# Patient Record
Sex: Female | Born: 1980 | Race: White | Hispanic: No | Marital: Married | Smoking: Never smoker
Health system: Southern US, Community
[De-identification: ages and names within clinical notes are randomized; demographics above are authoritative.]

## PROBLEM LIST (undated history)

## (undated) DIAGNOSIS — N644 Mastodynia: Secondary | ICD-10-CM

## (undated) DIAGNOSIS — F419 Anxiety disorder, unspecified: Secondary | ICD-10-CM

## (undated) DIAGNOSIS — L309 Dermatitis, unspecified: Secondary | ICD-10-CM

## (undated) DIAGNOSIS — F32A Depression, unspecified: Secondary | ICD-10-CM

## (undated) DIAGNOSIS — L719 Rosacea, unspecified: Secondary | ICD-10-CM

## (undated) DIAGNOSIS — I499 Cardiac arrhythmia, unspecified: Secondary | ICD-10-CM

## (undated) DIAGNOSIS — K602 Anal fissure, unspecified: Secondary | ICD-10-CM

## (undated) DIAGNOSIS — L509 Urticaria, unspecified: Secondary | ICD-10-CM

## (undated) DIAGNOSIS — T7840XA Allergy, unspecified, initial encounter: Secondary | ICD-10-CM

## (undated) HISTORY — DX: Urticaria, unspecified: L50.9

## (undated) HISTORY — DX: Dermatitis, unspecified: L30.9

## (undated) HISTORY — DX: Allergy, unspecified, initial encounter: T78.40XA

## (undated) HISTORY — DX: Cardiac arrhythmia, unspecified: I49.9

## (undated) HISTORY — DX: Anal fissure, unspecified: K60.2

## (undated) HISTORY — PX: REFRACTIVE SURGERY: SHX103

## (undated) HISTORY — DX: Anxiety disorder, unspecified: F41.9

## (undated) HISTORY — DX: Rosacea, unspecified: L71.9

## (undated) HISTORY — PX: BREAST IMPLANT REMOVAL: SUR1101

## (undated) HISTORY — DX: Depression, unspecified: F32.A

## (undated) HISTORY — PX: AUGMENTATION MAMMAPLASTY: SUR837

---

## 2019-03-05 ENCOUNTER — Encounter: Payer: Self-pay | Admitting: Internal Medicine

## 2019-03-05 ENCOUNTER — Telehealth: Payer: Self-pay | Admitting: Internal Medicine

## 2019-03-05 ENCOUNTER — Ambulatory Visit: Payer: BC Managed Care – PPO | Admitting: Internal Medicine

## 2019-03-05 VITALS — BP 102/68 | HR 63 | Temp 77.0°F | Ht 63.0 in | Wt 118.4 lb

## 2019-03-05 DIAGNOSIS — Z0000 Encounter for general adult medical examination without abnormal findings: Secondary | ICD-10-CM | POA: Diagnosis not present

## 2019-03-05 DIAGNOSIS — Z23 Encounter for immunization: Secondary | ICD-10-CM | POA: Diagnosis not present

## 2019-03-05 DIAGNOSIS — F419 Anxiety disorder, unspecified: Secondary | ICD-10-CM

## 2019-03-05 DIAGNOSIS — L719 Rosacea, unspecified: Secondary | ICD-10-CM | POA: Diagnosis not present

## 2019-03-05 LAB — CBC WITH DIFFERENTIAL/PLATELET
Basophils Absolute: 0 10*3/uL (ref 0.0–0.1)
Basophils Relative: 0.5 % (ref 0.0–3.0)
Eosinophils Absolute: 0 10*3/uL (ref 0.0–0.7)
Eosinophils Relative: 0.8 % (ref 0.0–5.0)
HCT: 40.3 % (ref 36.0–46.0)
Hemoglobin: 13.5 g/dL (ref 12.0–15.0)
Lymphocytes Relative: 29.9 % (ref 12.0–46.0)
Lymphs Abs: 1.8 10*3/uL (ref 0.7–4.0)
MCHC: 33.4 g/dL (ref 30.0–36.0)
MCV: 92.1 fl (ref 78.0–100.0)
Monocytes Absolute: 0.2 10*3/uL (ref 0.1–1.0)
Monocytes Relative: 3.8 % (ref 3.0–12.0)
Neutro Abs: 3.9 10*3/uL (ref 1.4–7.7)
Neutrophils Relative %: 65 % (ref 43.0–77.0)
Platelets: 190 10*3/uL (ref 150.0–400.0)
RBC: 4.37 Mil/uL (ref 3.87–5.11)
RDW: 14.1 % (ref 11.5–15.5)
WBC: 6 10*3/uL (ref 4.0–10.5)

## 2019-03-05 LAB — COMPREHENSIVE METABOLIC PANEL
ALT: 16 U/L (ref 0–35)
AST: 13 U/L (ref 0–37)
Albumin: 4.4 g/dL (ref 3.5–5.2)
Alkaline Phosphatase: 52 U/L (ref 39–117)
BUN: 19 mg/dL (ref 6–23)
CO2: 28 mEq/L (ref 19–32)
Calcium: 9.6 mg/dL (ref 8.4–10.5)
Chloride: 100 mEq/L (ref 96–112)
Creatinine, Ser: 1.13 mg/dL (ref 0.40–1.20)
GFR: 54.18 mL/min — ABNORMAL LOW (ref >60.00)
Glucose, Bld: 87 mg/dL (ref 70–99)
Potassium: 4.6 mEq/L (ref 3.5–5.1)
Sodium: 136 mEq/L (ref 135–145)
Total Bilirubin: 0.6 mg/dL (ref 0.2–1.2)
Total Protein: 7 g/dL (ref 6.0–8.3)

## 2019-03-05 LAB — LIPID PANEL
Cholesterol: 173 mg/dL (ref 0–200)
HDL: 93.1 mg/dL
LDL Cholesterol: 54 mg/dL (ref 0–99)
NonHDL: 80.13
Total CHOL/HDL Ratio: 2
Triglycerides: 132 mg/dL (ref 0.0–149.0)
VLDL: 26.4 mg/dL (ref 0.0–40.0)

## 2019-03-05 LAB — VITAMIN D 25 HYDROXY (VIT D DEFICIENCY, FRACTURES): VITD: 88.32 ng/mL (ref 30.00–100.00)

## 2019-03-05 LAB — VITAMIN B12: Vitamin B-12: 899 pg/mL (ref 211–911)

## 2019-03-05 LAB — TSH: TSH: 1.11 u[IU]/mL (ref 0.35–4.50)

## 2019-03-05 MED ORDER — AZELAIC ACID 15 % EX GEL: CUTANEOUS | 3 refills | Status: DC

## 2019-03-05 MED ORDER — DROSPIRENONE-ETHINYL ESTRADIOL 3-0.02 MG PO TABS: 1.0000 | ORAL_TABLET | Freq: Every day | ORAL | 11 refills | Status: DC

## 2019-03-05 NOTE — Telephone Encounter (Signed)
[facility] is calling and azelaic acid 15% only comes a gel in the percentage not a cream. Pharm will like to know if its ok to change to gel

## 2019-03-05 NOTE — Addendum Note (Signed)
Addended by: Westley Hummer B on: 03/05/2019 03:54 PM   Modules accepted: Orders

## 2019-03-05 NOTE — Patient Instructions (Signed)
-Nice meeting you today!!  -Lab work today; will notify you once results are available.  -Tetanus vaccine today.  -Increase escitalopram to 10 mg daily.  -Schedule follow up in 1 year or sooner as needed.   Preventive Care 18-37 Years Old, Female Preventive care refers to visits with your health care provider and lifestyle choices that can promote health and wellness. This includes:  A yearly physical exam. This may also be called an annual well check.  Regular dental visits and eye exams.  Immunizations.  Screening for certain conditions.  Healthy lifestyle choices, such as eating a healthy diet, getting regular exercise, not using drugs or products that contain nicotine and tobacco, and limiting alcohol use. What can I expect for my preventive care visit? Physical exam Your health care provider will check your:  Height and weight. This may be used to calculate body mass index (BMI), which tells if you are at a healthy weight.  Heart rate and blood pressure.  Skin for abnormal spots. Counseling Your health care provider may ask you questions about your:  Alcohol, tobacco, and drug use.  Emotional well-being.  Home and relationship well-being.  Sexual activity.  Eating habits.  Work and work Statistician.  Method of birth control.  Menstrual cycle.  Pregnancy history. What immunizations do I need?  Influenza (flu) vaccine  This is recommended every year. Tetanus, diphtheria, and pertussis (Tdap) vaccine  You may need a Td booster every 10 years. Varicella (chickenpox) vaccine  You may need this if you have not been vaccinated. Human papillomavirus (HPV) vaccine  If recommended by your health care provider, you may need three doses over 6 months. Measles, mumps, and rubella (MMR) vaccine  You may need at least one dose of MMR. You may also need a second dose. Meningococcal conjugate (MenACWY) vaccine  One dose is recommended if you are age 18-21  years and a first-year college student living in a residence hall, or if you have one of several medical conditions. You may also need additional booster doses. Pneumococcal conjugate (PCV13) vaccine  You may need this if you have certain conditions and were not previously vaccinated. Pneumococcal polysaccharide (PPSV23) vaccine  You may need one or two doses if you smoke cigarettes or if you have certain conditions. Hepatitis A vaccine  You may need this if you have certain conditions or if you travel or work in places where you may be exposed to hepatitis A. Hepatitis B vaccine  You may need this if you have certain conditions or if you travel or work in places where you may be exposed to hepatitis B. Haemophilus influenzae type b (Hib) vaccine  You may need this if you have certain conditions. You may receive vaccines as individual doses or as more than one vaccine together in one shot (combination vaccines). Talk with your health care provider about the risks and benefits of combination vaccines. What tests do I need?  Blood tests  Lipid and cholesterol levels. These may be checked every 5 years starting at age 44.  Hepatitis C test.  Hepatitis B test. Screening  Diabetes screening. This is done by checking your blood sugar (glucose) after you have not eaten for a while (fasting).  Sexually transmitted disease (STD) testing.  BRCA-related cancer screening. This may be done if you have a family history of breast, ovarian, tubal, or peritoneal cancers.  Pelvic exam and Pap test. This may be done every 3 years starting at age 78. Starting at age 64,  this may be done every 5 years if you have a Pap test in combination with an HPV test. Talk with your health care provider about your test results, treatment options, and if necessary, the need for more tests. Follow these instructions at home: Eating and drinking   Eat a diet that includes fresh fruits and vegetables, whole  grains, lean protein, and low-fat dairy.  Take vitamin and mineral supplements as recommended by your health care provider.  Do not drink alcohol if: ? Your health care provider tells you not to drink. ? You are pregnant, may be pregnant, or are planning to become pregnant.  If you drink alcohol: ? Limit how much you have to 0-1 drink a day. ? Be aware of how much alcohol is in your drink. In the [place] one drink equals one 12 oz bottle of beer (355 mL), one 5 oz glass of wine (148 mL), or one 1 oz glass of hard liquor (44 mL). Lifestyle  Take daily care of your teeth and gums.  Stay active. Exercise for at least 30 minutes on 5 or more days each week.  Do not use any products that contain nicotine or tobacco, such as cigarettes, e-cigarettes, and chewing tobacco. If you need help quitting, ask your health care provider.  If you are sexually active, practice safe sex. Use a condom or other form of birth control (contraception) in order to prevent pregnancy and STIs (sexually transmitted infections). If you plan to become pregnant, see your health care provider for a preconception visit. What's next?  Visit your health care provider once a year for a well check visit.  Ask your health care provider how often you should have your eyes and teeth checked.  Stay up to date on all vaccines. This information is not intended to replace advice given to you by your health care provider. Make sure you discuss any questions you have with your health care provider. Document Released: 09/25/2001 Document Revised: 04/10/2018 Document Reviewed: 04/10/2018 Elsevier Patient Education  2020 [employer].

## 2019-03-05 NOTE — Telephone Encounter (Signed)
New Rx sent and med list updated  

## 2019-03-05 NOTE — Addendum Note (Signed)
Addended by: Elmer Picker on: 03/05/2019 08:54 AM   Modules accepted: Orders

## 2019-03-05 NOTE — Progress Notes (Signed)
New Patient Office Visit     CC/Reason for Visit: Establish care, annual physical, medication refills Previous PCP: In [place] Last Visit: 2019  HPI: Marie Hanson is a 38 y.o. female who is coming in today for the above mentioned reasons. Past Medical History is significant for: Anxiety on Lexapro 5 mg which she has been on for over 10 years, history of seasonal allergies on mometasone and over-the-counter antihistamine.  She also has rosacea and uses azelaic acid for it.  She is also on birth control.  She and her husband have been living abroad in [place] as her husband works for [employer], they have just moved to [place].  She is a Agricultural engineer.  She is a never smoker, is allergic to alcohol so does not drink.  Her family history is significant for mother with diabetes and hypertension and a father with depression and anxiety.  She has already set up eye and dental appointments.  She is due for tetanus today.  She had a Pap smear in [place] last year that was reported as normal.   Past Medical/Surgical History: Past Medical History:  Diagnosis Date  . Anxiety   . Rosacea     History reviewed. No pertinent surgical history.  Social History:  reports that she has never smoked. She has never used smokeless tobacco. She reports that she does not drink alcohol or use drugs.  Allergies: No Known Allergies  Family History:  Family History  Problem Relation Age of Onset  . Diabetes Mother   . Hypertension Mother   . Depression Father   . Anxiety disorder Father      Current Outpatient Medications:  .  Azelaic Acid 15 % cream, After skin is thoroughly washed and patted dry, gently but thoroughly massage a thin film of azelaic acid cream into the affected area twice daily, in the morning and evening., Disp: 30 g, Rfl: 3 .  drospirenone-ethinyl estradiol (YAZ) 3-0.02 MG tablet, Take 1 tablet by mouth daily., Disp: 1 Package, Rfl: 11 .  escitalopram (LEXAPRO) 5 MG tablet, Take 10  mg by mouth daily., Disp: , Rfl:  .  fluticasone (FLONASE) 50 MCG/ACT nasal spray, Place into both nostrils daily., Disp: , Rfl:  .  Melatonin-Pyridoxine (MELATIN PO), Take 5 mg by mouth at bedtime as needed., Disp: , Rfl:  .  Methylcobalamin (B-12) 1000 MCG TBDP, Take by mouth., Disp: , Rfl:  .  Multiple Vitamin (MULTIVITAMIN) tablet, Take 1 tablet by mouth daily., Disp: , Rfl:  .  Omega-3 Fatty Acids (FISH OIL) 1000 MG CAPS, Take by mouth., Disp: , Rfl:   Review of Systems:  Constitutional: Denies fever, chills, diaphoresis, appetite change and fatigue.  HEENT: Denies photophobia, eye pain, redness, hearing loss, ear pain, congestion, sore throat, rhinorrhea, sneezing, mouth sores, trouble swallowing, neck pain, neck stiffness and tinnitus.   Respiratory: Denies SOB, DOE, cough, chest tightness,  and wheezing.   Cardiovascular: Denies chest pain, palpitations and leg swelling.  Gastrointestinal: Denies nausea, vomiting, abdominal pain, diarrhea, constipation, blood in stool and abdominal distention.  Genitourinary: Denies dysuria, urgency, frequency, hematuria, flank pain and difficulty urinating.  Endocrine: Denies: hot or cold intolerance, sweats, changes in hair or nails, polyuria, polydipsia. Musculoskeletal: Denies myalgias, back pain, joint swelling, arthralgias and gait problem.  Skin: Denies pallor, rash and wound.  Neurological: Denies dizziness, seizures, syncope, weakness, light-headedness, numbness and headaches.  Hematological: Denies adenopathy. Easy bruising, personal or family bleeding history  Psychiatric/Behavioral: Denies suicidal ideation, mood changes, confusion, nervousness,  sleep disturbance and agitation    Physical Exam: Vitals:   03/05/19 0812  BP: 102/68  Pulse: 63  Temp: (!) 77 F (25 C)  TempSrc: Oral  SpO2: 97%  Weight: 118 lb 6.4 oz (53.7 kg)  Height: _0  (1.6 m)   Body mass index is 20.97 kg/m.  Constitutional: NAD, calm, comfortable Eyes:  PERRL, lids and conjunctivae normal ENMT: Mucous membranes are moist. Posterior pharynx clear of any exudate or lesions. Normal dentition. Tympanic membrane is pearly white, no erythema or bulging. Neck: normal, supple, no masses, no thyromegaly Respiratory: clear to auscultation bilaterally, no wheezing, no crackles. Normal respiratory effort. No accessory muscle use.  Cardiovascular: Regular rate and rhythm, no murmurs / rubs / gallops. No extremity edema. 2+ pedal pulses. No carotid bruits.  Abdomen: no tenderness, no masses palpated. No hepatosplenomegaly. Bowel sounds positive.  Musculoskeletal: no clubbing / cyanosis. No joint deformity upper and lower extremities. Good ROM, no contractures. Normal muscle tone.  Skin: no rashes, lesions, ulcers. No induration Neurologic: CN 2-12 grossly intact. Sensation intact, DTR normal. Strength 5/5 in all 4.  Psychiatric: Normal judgment and insight. Alert and oriented x 3. Normal mood.    Impression and Plan:  Encounter for preventive health examination -Has set up appointments for eye and dental care. -Tetanus today, otherwise immunizations are up-to-date. -Screening labs today, note she is not fasting. -Healthy lifestyle has been discussed in detail with her. -Commence routine breast cancer screening at age 30, colon cancer screening at age 34. -She had a Pap smear in [place] in 2019 that is reported as normal, redo Pap smear 2022.  Rosacea  -Requesting refills of azaleic acid.  Anxiety  -She feels like her anxiety is peaking, likely due to her recent move from [place].  Also states she is having more difficulty sleeping. -Have recommended increasing Lexapro dose from 5 to 10 mg.  She will notify us in about 6 weeks if no significant improvement.    Patient Instructions  -Nice meeting you today!!  -Lab work today; will notify you once results are available.  -Tetanus vaccine today.  -Increase escitalopram to 10 mg daily.  -Schedule  follow up in 1 year or sooner as needed.   Preventive Care 29-59 Years Old, Female Preventive care refers to visits with your health care provider and lifestyle choices that can promote health and wellness. This includes:  A yearly physical exam. This may also be called an annual well check.  Regular dental visits and eye exams.  Immunizations.  Screening for certain conditions.  Healthy lifestyle choices, such as eating a healthy diet, getting regular exercise, not using drugs or products that contain nicotine and tobacco, and limiting alcohol use. What can I expect for my preventive care visit? Physical exam Your health care provider will check your:  Height and weight. This may be used to calculate body mass index (BMI), which tells if you are at a healthy weight.  Heart rate and blood pressure.  Skin for abnormal spots. Counseling Your health care provider may ask you questions about your:  Alcohol, tobacco, and drug use.  Emotional well-being.  Home and relationship well-being.  Sexual activity.  Eating habits.  Work and work Statistician.  Method of birth control.  Menstrual cycle.  Pregnancy history. What immunizations do I need?  Influenza (flu) vaccine  This is recommended every year. Tetanus, diphtheria, and pertussis (Tdap) vaccine  You may need a Td booster every 10 years. Varicella (chickenpox) vaccine  You may  need this if you have not been vaccinated. Human papillomavirus (HPV) vaccine  If recommended by your health care provider, you may need three doses over 6 months. Measles, mumps, and rubella (MMR) vaccine  You may need at least one dose of MMR. You may also need a second dose. Meningococcal conjugate (MenACWY) vaccine  One dose is recommended if you are age 57-21 years and a first-year college student living in a residence hall, or if you have one of several medical conditions. You may also need additional booster doses. Pneumococcal  conjugate (PCV13) vaccine  You may need this if you have certain conditions and were not previously vaccinated. Pneumococcal polysaccharide (PPSV23) vaccine  You may need one or two doses if you smoke cigarettes or if you have certain conditions. Hepatitis A vaccine  You may need this if you have certain conditions or if you travel or work in places where you may be exposed to hepatitis A. Hepatitis B vaccine  You may need this if you have certain conditions or if you travel or work in places where you may be exposed to hepatitis B. Haemophilus influenzae type b (Hib) vaccine  You may need this if you have certain conditions. You may receive vaccines as individual doses or as more than one vaccine together in one shot (combination vaccines). Talk with your health care provider about the risks and benefits of combination vaccines. What tests do I need?  Blood tests  Lipid and cholesterol levels. These may be checked every 5 years starting at age 16.  Hepatitis C test.  Hepatitis B test. Screening  Diabetes screening. This is done by checking your blood sugar (glucose) after you have not eaten for a while (fasting).  Sexually transmitted disease (STD) testing.  BRCA-related cancer screening. This may be done if you have a family history of breast, ovarian, tubal, or peritoneal cancers.  Pelvic exam and Pap test. This may be done every 3 years starting at age 21. Starting at age 78, this may be done every 5 years if you have a Pap test in combination with an HPV test. Talk with your health care provider about your test results, treatment options, and if necessary, the need for more tests. Follow these instructions at home: Eating and drinking   Eat a diet that includes fresh fruits and vegetables, whole grains, lean protein, and low-fat dairy.  Take vitamin and mineral supplements as recommended by your health care provider.  Do not drink alcohol if: ? Your health care  provider tells you not to drink. ? You are pregnant, may be pregnant, or are planning to become pregnant.  If you drink alcohol: ? Limit how much you have to 0-1 drink a day. ? Be aware of how much alcohol is in your drink. In the [place] one drink equals one 12 oz bottle of beer (355 mL), one 5 oz glass of wine (148 mL), or one 1 oz glass of hard liquor (44 mL). Lifestyle  Take daily care of your teeth and gums.  Stay active. Exercise for at least 30 minutes on 5 or more days each week.  Do not use any products that contain nicotine or tobacco, such as cigarettes, e-cigarettes, and chewing tobacco. If you need help quitting, ask your health care provider.  If you are sexually active, practice safe sex. Use a condom or other form of birth control (contraception) in order to prevent pregnancy and STIs (sexually transmitted infections). If you plan to become pregnant, see  your health care provider for a preconception visit. What's next?  Visit your health care provider once a year for a well check visit.  Ask your health care provider how often you should have your eyes and teeth checked.  Stay up to date on all vaccines. This information is not intended to replace advice given to you by your health care provider. Make sure you discuss any questions you have with your health care provider. Document Released: 09/25/2001 Document Revised: 04/10/2018 Document Reviewed: 04/10/2018 Elsevier Patient Education  2020 [place], [place] [facility]

## 2019-05-02 ENCOUNTER — Encounter: Payer: Self-pay | Admitting: Internal Medicine

## 2019-05-04 ENCOUNTER — Telehealth (INDEPENDENT_AMBULATORY_CARE_PROVIDER_SITE_OTHER): Payer: BC Managed Care – PPO | Admitting: Family Medicine

## 2019-05-04 ENCOUNTER — Other Ambulatory Visit: Payer: Self-pay

## 2019-05-04 ENCOUNTER — Encounter: Payer: Self-pay | Admitting: Family Medicine

## 2019-05-04 DIAGNOSIS — B370 Candidal stomatitis: Secondary | ICD-10-CM | POA: Diagnosis not present

## 2019-05-04 MED ORDER — NYSTATIN 100000 UNIT/ML MT SUSP: 5.0000 mL | Freq: Four times a day (QID) | OROMUCOSAL | 0 refills | Status: DC

## 2019-05-04 NOTE — Progress Notes (Signed)
Virtual Visit via Video Note  I connected with the patient on 05/04/19 at  3:45 PM EDT by a video enabled telemedicine application and verified that I am speaking with the correct person using two identifiers.  Location patient: home Location provider:work or home office Persons participating in the virtual visit: patient, provider  I discussed the limitations of evaluation and management by telemedicine and the availability of in person appointments. The patient expressed understanding and agreed to proceed.   HPI: Here for what she thinks is thrush. She has had this in the past. For 3 days she has had soreness on the tongue and a white slimy coating on the tongue. Now the soreness is spreading down the back of the throat. No URI symptoms or PND. No cough or headache or fever. No recent antibiotic usage.    ROS: See pertinent positives and negatives per HPI.  Past Medical History:  Diagnosis Date  . Anxiety   . Rosacea     No past surgical history on file.  Family History  Problem Relation Age of Onset  . Diabetes Mother   . Hypertension Mother   . Depression Father   . Anxiety disorder Father      Current Outpatient Medications:  .  Azelaic Acid (FINACEA) 15 % cream, After skin is thoroughly washed and patted dry, gently but thoroughly massage a thin film of azelaic acid cream into the affected area twice daily, in the morning and evening., Disp: 30 g, Rfl: 3 .  drospirenone-ethinyl estradiol (YAZ) 3-0.02 MG tablet, Take 1 tablet by mouth daily., Disp: 1 Package, Rfl: 11 .  escitalopram (LEXAPRO) 5 MG tablet, Take 10 mg by mouth daily., Disp: , Rfl:  .  fluticasone (FLONASE) 50 MCG/ACT nasal spray, Place into both nostrils daily., Disp: , Rfl:  .  Melatonin-Pyridoxine (MELATIN PO), Take 5 mg by mouth at bedtime as needed., Disp: , Rfl:  .  Methylcobalamin (B-12) 1000 MCG TBDP, Take by mouth., Disp: , Rfl:  .  Multiple Vitamin (MULTIVITAMIN) tablet, Take 1 tablet by mouth  daily., Disp: , Rfl:  .  nystatin (MYCOSTATIN) 100000 UNIT/ML suspension, Take 5 mLs (500,000 Units total) by mouth 4 (four) times daily., Disp: 120 mL, Rfl: 0 .  Omega-3 Fatty Acids (FISH OIL) 1000 MG CAPS, Take by mouth., Disp: , Rfl:   EXAM:  VITALS per patient if applicable:  GENERAL: alert, oriented, appears well and in no acute distress  HEENT: atraumatic, conjunttiva clear, no obvious abnormalities on inspection of external nose and ears  NECK: normal movements of the head and neck  LUNGS: on inspection no signs of respiratory distress, breathing rate appears normal, no obvious gross SOB, gasping or wheezing  CV: no obvious cyanosis  MS: moves all visible extremities without noticeable abnormality  PSYCH/NEURO: pleasant and cooperative, no obvious depression or anxiety, speech and thought processing grossly intact  ASSESSMENT AND PLAN: Thrush, treat with Nystatin oral suspension QID.  Alysia Penna, MD  Discussed the following assessment and plan:  No diagnosis found.     I discussed the assessment and treatment plan with the patient. The patient was provided an opportunity to ask questions and all were answered. The patient agreed with the plan and demonstrated an understanding of the instructions.   The patient was advised to call back or seek an in-person evaluation if the symptoms worsen or if the condition fails to improve as anticipated.

## 2019-05-06 ENCOUNTER — Encounter: Payer: Self-pay | Admitting: Internal Medicine

## 2019-05-06 NOTE — Telephone Encounter (Signed)
Office note 03/05/2019   Anxiety  -She feels like her anxiety is peaking, likely due to her recent move from [place].  Also states she is having more difficulty sleeping. -Have recommended increasing Lexapro dose from 5 to 10 mg.  She will notify us in about 6 weeks if no significant improvement.   Okay to refill?

## 2019-05-11 DIAGNOSIS — F411 Generalized anxiety disorder: Secondary | ICD-10-CM | POA: Diagnosis not present

## 2019-05-11 DIAGNOSIS — F341 Dysthymic disorder: Secondary | ICD-10-CM | POA: Diagnosis not present

## 2019-06-01 DIAGNOSIS — F411 Generalized anxiety disorder: Secondary | ICD-10-CM | POA: Diagnosis not present

## 2019-06-01 DIAGNOSIS — F341 Dysthymic disorder: Secondary | ICD-10-CM | POA: Diagnosis not present

## 2019-06-24 DIAGNOSIS — H04123 Dry eye syndrome of bilateral lacrimal glands: Secondary | ICD-10-CM | POA: Diagnosis not present

## 2019-06-24 DIAGNOSIS — H04331 Acute lacrimal canaliculitis of right lacrimal passage: Secondary | ICD-10-CM | POA: Diagnosis not present

## 2019-06-29 DIAGNOSIS — F411 Generalized anxiety disorder: Secondary | ICD-10-CM | POA: Diagnosis not present

## 2019-06-29 DIAGNOSIS — F3341 Major depressive disorder, recurrent, in partial remission: Secondary | ICD-10-CM | POA: Diagnosis not present

## 2019-07-02 DIAGNOSIS — H04331 Acute lacrimal canaliculitis of right lacrimal passage: Secondary | ICD-10-CM | POA: Diagnosis not present

## 2019-07-02 DIAGNOSIS — H04123 Dry eye syndrome of bilateral lacrimal glands: Secondary | ICD-10-CM | POA: Diagnosis not present

## 2019-08-05 ENCOUNTER — Other Ambulatory Visit: Payer: Self-pay | Admitting: *Deleted

## 2019-08-05 NOTE — Telephone Encounter (Signed)
Patient request refills:  Last office visit 03/05/2019  Azelaic acid gel 50 mg Drospirenone/eth es tabs 28's  [facility]

## 2019-08-06 ENCOUNTER — Telehealth: Payer: Self-pay | Admitting: *Deleted

## 2019-08-06 MED ORDER — DROSPIRENONE-ETHINYL ESTRADIOL 3-0.02 MG PO TABS: 1.0000 | ORAL_TABLET | Freq: Every day | ORAL | 5 refills | Status: DC

## 2019-08-06 MED ORDER — AZELAIC ACID 15 % EX GEL: CUTANEOUS | 3 refills | Status: DC

## 2019-08-06 NOTE — Telephone Encounter (Signed)
Copied from [place] [id number]. Topic: General - Other >> Aug 06, 2019 10:04 AM Jodie Echevaria wrote: Reason for CRM: [facility] needing to verify the Rx for Azelaic Acid (FINACEA) 15 % cream need clarification on the form to be distributed and how to use. Available in powder and gel Please contact

## 2019-08-13 MED ORDER — AZELAIC ACID 15 % EX GEL: CUTANEOUS | 1 refills | Status: DC

## 2019-08-13 NOTE — Telephone Encounter (Signed)
Rx hard copy faxed and confirmed.

## 2019-08-13 NOTE — Addendum Note (Signed)
Addended by: Westley Hummer B on: 08/13/2019 07:58 AM   Modules accepted: Orders

## 2019-08-13 NOTE — Telephone Encounter (Signed)
Gel to use daily

## 2019-08-17 DIAGNOSIS — F341 Dysthymic disorder: Secondary | ICD-10-CM | POA: Diagnosis not present

## 2019-08-17 DIAGNOSIS — F3341 Major depressive disorder, recurrent, in partial remission: Secondary | ICD-10-CM | POA: Diagnosis not present

## 2019-08-17 DIAGNOSIS — F411 Generalized anxiety disorder: Secondary | ICD-10-CM | POA: Diagnosis not present

## 2019-08-20 DIAGNOSIS — F3341 Major depressive disorder, recurrent, in partial remission: Secondary | ICD-10-CM | POA: Diagnosis not present

## 2019-08-20 DIAGNOSIS — F411 Generalized anxiety disorder: Secondary | ICD-10-CM | POA: Diagnosis not present

## 2019-08-22 ENCOUNTER — Other Ambulatory Visit: Payer: Self-pay | Admitting: Internal Medicine

## 2019-08-26 ENCOUNTER — Telehealth: Payer: Self-pay | Admitting: Internal Medicine

## 2019-08-26 NOTE — Telephone Encounter (Signed)
Copied from CRM [id number]. Topic: General - Other >> Aug 26, 2019  9:03 AM Tamela Oddi wrote: Reason for CRM: Pharmacy called to get validation of a prescription for Azelaic Acid (FINACEA) 15 % cream.  Please call within 24 hrs.  to validate at [phone], Ref# [id number]

## 2019-08-26 NOTE — Telephone Encounter (Signed)
Forwarding to PCP's CMA  

## 2019-08-28 NOTE — Telephone Encounter (Signed)
Spoke with pharmacist  

## 2019-09-07 DIAGNOSIS — F3341 Major depressive disorder, recurrent, in partial remission: Secondary | ICD-10-CM | POA: Diagnosis not present

## 2019-09-07 DIAGNOSIS — F411 Generalized anxiety disorder: Secondary | ICD-10-CM | POA: Diagnosis not present

## 2019-09-28 DIAGNOSIS — F3341 Major depressive disorder, recurrent, in partial remission: Secondary | ICD-10-CM | POA: Diagnosis not present

## 2019-09-28 DIAGNOSIS — F411 Generalized anxiety disorder: Secondary | ICD-10-CM | POA: Diagnosis not present

## 2019-09-28 DIAGNOSIS — F341 Dysthymic disorder: Secondary | ICD-10-CM | POA: Diagnosis not present

## 2019-10-06 DIAGNOSIS — M5384 Other specified dorsopathies, thoracic region: Secondary | ICD-10-CM | POA: Diagnosis not present

## 2019-10-06 DIAGNOSIS — M9901 Segmental and somatic dysfunction of cervical region: Secondary | ICD-10-CM | POA: Diagnosis not present

## 2019-10-06 DIAGNOSIS — M9902 Segmental and somatic dysfunction of thoracic region: Secondary | ICD-10-CM | POA: Diagnosis not present

## 2019-10-06 DIAGNOSIS — M50322 Other cervical disc degeneration at C5-C6 level: Secondary | ICD-10-CM | POA: Diagnosis not present

## 2019-10-07 DIAGNOSIS — M9902 Segmental and somatic dysfunction of thoracic region: Secondary | ICD-10-CM | POA: Diagnosis not present

## 2019-10-07 DIAGNOSIS — M9901 Segmental and somatic dysfunction of cervical region: Secondary | ICD-10-CM | POA: Diagnosis not present

## 2019-10-07 DIAGNOSIS — M5384 Other specified dorsopathies, thoracic region: Secondary | ICD-10-CM | POA: Diagnosis not present

## 2019-10-07 DIAGNOSIS — M50322 Other cervical disc degeneration at C5-C6 level: Secondary | ICD-10-CM | POA: Diagnosis not present

## 2019-10-09 ENCOUNTER — Other Ambulatory Visit: Payer: Self-pay | Admitting: Internal Medicine

## 2019-10-12 DIAGNOSIS — M9902 Segmental and somatic dysfunction of thoracic region: Secondary | ICD-10-CM | POA: Diagnosis not present

## 2019-10-12 DIAGNOSIS — M50322 Other cervical disc degeneration at C5-C6 level: Secondary | ICD-10-CM | POA: Diagnosis not present

## 2019-10-12 DIAGNOSIS — M5384 Other specified dorsopathies, thoracic region: Secondary | ICD-10-CM | POA: Diagnosis not present

## 2019-10-12 DIAGNOSIS — M9901 Segmental and somatic dysfunction of cervical region: Secondary | ICD-10-CM | POA: Diagnosis not present

## 2019-10-14 DIAGNOSIS — M9901 Segmental and somatic dysfunction of cervical region: Secondary | ICD-10-CM | POA: Diagnosis not present

## 2019-10-14 DIAGNOSIS — M9902 Segmental and somatic dysfunction of thoracic region: Secondary | ICD-10-CM | POA: Diagnosis not present

## 2019-10-14 DIAGNOSIS — M5384 Other specified dorsopathies, thoracic region: Secondary | ICD-10-CM | POA: Diagnosis not present

## 2019-10-14 DIAGNOSIS — M50322 Other cervical disc degeneration at C5-C6 level: Secondary | ICD-10-CM | POA: Diagnosis not present

## 2019-10-15 DIAGNOSIS — M9901 Segmental and somatic dysfunction of cervical region: Secondary | ICD-10-CM | POA: Diagnosis not present

## 2019-10-15 DIAGNOSIS — M5384 Other specified dorsopathies, thoracic region: Secondary | ICD-10-CM | POA: Diagnosis not present

## 2019-10-15 DIAGNOSIS — M50322 Other cervical disc degeneration at C5-C6 level: Secondary | ICD-10-CM | POA: Diagnosis not present

## 2019-10-15 DIAGNOSIS — M9902 Segmental and somatic dysfunction of thoracic region: Secondary | ICD-10-CM | POA: Diagnosis not present

## 2019-10-19 DIAGNOSIS — M9901 Segmental and somatic dysfunction of cervical region: Secondary | ICD-10-CM | POA: Diagnosis not present

## 2019-10-19 DIAGNOSIS — M9902 Segmental and somatic dysfunction of thoracic region: Secondary | ICD-10-CM | POA: Diagnosis not present

## 2019-10-19 DIAGNOSIS — M50322 Other cervical disc degeneration at C5-C6 level: Secondary | ICD-10-CM | POA: Diagnosis not present

## 2019-10-19 DIAGNOSIS — M5384 Other specified dorsopathies, thoracic region: Secondary | ICD-10-CM | POA: Diagnosis not present

## 2019-10-21 DIAGNOSIS — M9902 Segmental and somatic dysfunction of thoracic region: Secondary | ICD-10-CM | POA: Diagnosis not present

## 2019-10-21 DIAGNOSIS — M9901 Segmental and somatic dysfunction of cervical region: Secondary | ICD-10-CM | POA: Diagnosis not present

## 2019-10-21 DIAGNOSIS — M50322 Other cervical disc degeneration at C5-C6 level: Secondary | ICD-10-CM | POA: Diagnosis not present

## 2019-10-21 DIAGNOSIS — M5384 Other specified dorsopathies, thoracic region: Secondary | ICD-10-CM | POA: Diagnosis not present

## 2019-10-22 DIAGNOSIS — M5384 Other specified dorsopathies, thoracic region: Secondary | ICD-10-CM | POA: Diagnosis not present

## 2019-10-22 DIAGNOSIS — M9902 Segmental and somatic dysfunction of thoracic region: Secondary | ICD-10-CM | POA: Diagnosis not present

## 2019-10-22 DIAGNOSIS — M9901 Segmental and somatic dysfunction of cervical region: Secondary | ICD-10-CM | POA: Diagnosis not present

## 2019-10-22 DIAGNOSIS — M50322 Other cervical disc degeneration at C5-C6 level: Secondary | ICD-10-CM | POA: Diagnosis not present

## 2019-10-26 DIAGNOSIS — F3341 Major depressive disorder, recurrent, in partial remission: Secondary | ICD-10-CM | POA: Diagnosis not present

## 2019-10-26 DIAGNOSIS — F411 Generalized anxiety disorder: Secondary | ICD-10-CM | POA: Diagnosis not present

## 2019-10-26 DIAGNOSIS — F341 Dysthymic disorder: Secondary | ICD-10-CM | POA: Diagnosis not present

## 2019-10-29 DIAGNOSIS — M50322 Other cervical disc degeneration at C5-C6 level: Secondary | ICD-10-CM | POA: Diagnosis not present

## 2019-10-29 DIAGNOSIS — M9901 Segmental and somatic dysfunction of cervical region: Secondary | ICD-10-CM | POA: Diagnosis not present

## 2019-10-29 DIAGNOSIS — M9902 Segmental and somatic dysfunction of thoracic region: Secondary | ICD-10-CM | POA: Diagnosis not present

## 2019-10-29 DIAGNOSIS — M5384 Other specified dorsopathies, thoracic region: Secondary | ICD-10-CM | POA: Diagnosis not present

## 2019-11-11 DIAGNOSIS — M5384 Other specified dorsopathies, thoracic region: Secondary | ICD-10-CM | POA: Diagnosis not present

## 2019-11-11 DIAGNOSIS — M9901 Segmental and somatic dysfunction of cervical region: Secondary | ICD-10-CM | POA: Diagnosis not present

## 2019-11-11 DIAGNOSIS — M9902 Segmental and somatic dysfunction of thoracic region: Secondary | ICD-10-CM | POA: Diagnosis not present

## 2019-11-11 DIAGNOSIS — M50322 Other cervical disc degeneration at C5-C6 level: Secondary | ICD-10-CM | POA: Diagnosis not present

## 2019-11-12 ENCOUNTER — Ambulatory Visit: Payer: BC Managed Care – PPO | Attending: Internal Medicine

## 2019-11-12 DIAGNOSIS — Z23 Encounter for immunization: Secondary | ICD-10-CM

## 2019-11-12 NOTE — Progress Notes (Signed)
   Covid-19 Vaccination Clinic  Name:  Marie Hanson    MRN: [id number] DOB: June 04, 1981  11/12/2019  [name] was observed post Covid-19 immunization for 15 minutes without incident. She was provided with Vaccine Information Sheet and instruction to access the V-Safe system.   [name] was instructed to call 911 with any severe reactions post vaccine: Marland Kitchen Difficulty breathing  . Swelling of face and throat  . A fast heartbeat  . A bad rash all over body  . Dizziness and weakness   Immunizations Administered    Name Date Dose VIS Date Route   Pfizer COVID-19 Vaccine 11/12/2019  9:39 AM 0.3 mL 07/24/2019 Intramuscular   Manufacturer: [employer], [employer]   Lot: [id number]   NDC: [address]-2

## 2019-11-16 ENCOUNTER — Other Ambulatory Visit: Payer: Self-pay | Admitting: Internal Medicine

## 2019-11-16 DIAGNOSIS — M419 Scoliosis, unspecified: Secondary | ICD-10-CM | POA: Diagnosis not present

## 2019-11-16 DIAGNOSIS — M542 Cervicalgia: Secondary | ICD-10-CM | POA: Diagnosis not present

## 2019-12-01 DIAGNOSIS — F3341 Major depressive disorder, recurrent, in partial remission: Secondary | ICD-10-CM | POA: Diagnosis not present

## 2019-12-01 DIAGNOSIS — F341 Dysthymic disorder: Secondary | ICD-10-CM | POA: Diagnosis not present

## 2019-12-01 DIAGNOSIS — F411 Generalized anxiety disorder: Secondary | ICD-10-CM | POA: Diagnosis not present

## 2019-12-03 ENCOUNTER — Other Ambulatory Visit: Payer: Self-pay

## 2019-12-04 ENCOUNTER — Ambulatory Visit (INDEPENDENT_AMBULATORY_CARE_PROVIDER_SITE_OTHER): Payer: BC Managed Care – PPO | Admitting: Internal Medicine

## 2019-12-04 ENCOUNTER — Encounter: Payer: Self-pay | Admitting: Internal Medicine

## 2019-12-04 VITALS — BP 122/78 | HR 66 | Temp 98.7°F | Wt 118.3 lb

## 2019-12-04 DIAGNOSIS — G8929 Other chronic pain: Secondary | ICD-10-CM | POA: Diagnosis not present

## 2019-12-04 DIAGNOSIS — R519 Headache, unspecified: Secondary | ICD-10-CM

## 2019-12-04 MED ORDER — SUMATRIPTAN SUCCINATE 50 MG PO TABS: 50.0000 mg | ORAL_TABLET | ORAL | 2 refills | Status: DC | PRN

## 2019-12-04 NOTE — Patient Instructions (Signed)
-  Nice seeing you today!!  -May try imitrex 50 mg at the onset of a headache, and may take a second tablet 2 hours later if needed.  -Referral to headache clinic placed today.

## 2019-12-04 NOTE — Progress Notes (Signed)
Acute Office Visit     This visit occurred during the SARS-CoV-2 public health emergency.  Safety protocols were in place, including screening questions prior to the visit, additional usage of staff PPE, and extensive cleaning of exam room while observing appropriate contact time as indicated for disinfecting solutions.    CC/Reason for Visit: Discuss headaches  HPI: Marie Hanson is a 39 y.o. female who is coming in today for the above mentioned reasons.  She has been having almost daily headaches now for a couple months.  Headaches are mainly located at the base of her head and upper neck.  Sometimes she has photosensitivity but no nausea or vomiting.  She denies any concerning symptoms like fever, neck stiffness or any focal neurologic deficits, no vision disturbance.  She had a recent eye exam that was normal.  She has been on the same hormonal birth control for years.  She states that she has had a history of headaches in the past and they had tried Botox injection with success.  She has also had some local massage therapy and chiropractic visits which did help.  She has been using Tylenol, ibuprofen, Aleve, Excedrin Migraine almost daily without relief.  Past Medical/Surgical History: Past Medical History:  Diagnosis Date  . Anxiety   . Rosacea     No past surgical history on file.  Social History:  reports that she has never smoked. She has never used smokeless tobacco. She reports that she does not drink alcohol or use drugs.  Allergies: No Known Allergies  Family History:  Family History  Problem Relation Age of Onset  . Diabetes Mother   . Hypertension Mother   . Depression Father   . Anxiety disorder Father      Current Outpatient Medications:  .  Azelaic Acid (FINACEA) 15 % cream, After skin is thoroughly washed and patted dry, gently but thoroughly massage a thin film of azelaic acid cream into the affected area twice daily, in the morning and  evening., Disp: 30 g, Rfl: 3 .  Azelaic Acid (FINACEA) 15 % cream, Apply a thin film of azelaic acid cream into the affected area twice daily, Disp: 30 g, Rfl: 1 .  Azelaic Acid 15 % cream, APPLY A THIN FILM INTO THE AFFECTED AREA TWICE A DAY, Disp: 50 g, Rfl: 13 .  drospirenone-ethinyl estradiol (YAZ) 3-0.02 MG tablet, TAKE 1 TABLET DAILY, Disp: 84 tablet, Rfl: 3 .  escitalopram (LEXAPRO) 5 MG tablet, Take 10 mg by mouth daily., Disp: , Rfl:  .  fluticasone (FLONASE) 50 MCG/ACT nasal spray, Place into both nostrils daily., Disp: , Rfl:  .  Melatonin-Pyridoxine (MELATIN PO), Take 5 mg by mouth at bedtime as needed., Disp: , Rfl:  .  Multiple Vitamin (MULTIVITAMIN) tablet, Take 1 tablet by mouth daily., Disp: , Rfl:  .  Omega-3 Fatty Acids (FISH OIL) 1000 MG CAPS, Take by mouth., Disp: , Rfl:  .  SUMAtriptan (IMITREX) 50 MG tablet, Take 1 tablet (50 mg total) by mouth every 2 (two) hours as needed for migraine. May repeat in 2 hours if headache persists or recurs., Disp: 10 tablet, Rfl: 2  Review of Systems:  Constitutional: Denies fever, chills, diaphoresis, appetite change and fatigue.  HEENT: Denies photophobia, eye pain, redness, hearing loss, ear pain, congestion, sore throat, rhinorrhea, sneezing, mouth sores, trouble swallowing, neck pain, neck stiffness and tinnitus.   Respiratory: Denies SOB, DOE, cough, chest tightness,  and wheezing.   Cardiovascular: Denies chest  pain, palpitations and leg swelling.  Gastrointestinal: Denies nausea, vomiting, abdominal pain, diarrhea, constipation, blood in stool and abdominal distention.  Genitourinary: Denies dysuria, urgency, frequency, hematuria, flank pain and difficulty urinating.  Endocrine: Denies: hot or cold intolerance, sweats, changes in hair or nails, polyuria, polydipsia. Musculoskeletal: Denies myalgias, back pain, joint swelling, arthralgias and gait problem.  Skin: Denies pallor, rash and wound.  Neurological: Denies dizziness,  seizures, syncope, weakness, light-headedness, numbness. Hematological: Denies adenopathy. Easy bruising, personal or family bleeding history  Psychiatric/Behavioral: Denies suicidal ideation, mood changes, confusion, nervousness, sleep disturbance and agitation    Physical Exam: Vitals:   12/04/19 1450  BP: 122/78  Pulse: 66  Temp: 98.7 F (37.1 C)  TempSrc: Temporal  SpO2: 99%  Weight: 118 lb 4.8 oz (53.7 kg)    Body mass index is 20.96 kg/m.   Constitutional: NAD, calm, comfortable Eyes: PERRL, lids and conjunctivae normal ENMT: Mucous membranes are moist. Tympanic membrane is pearly white, no erythema or bulging. Respiratory: clear to auscultation bilaterally, no wheezing, no crackles. Normal respiratory effort. No accessory muscle use.  Cardiovascular: Regular rate and rhythm, no murmurs / rubs / gallops. No extremity edema.  Neurologic: Grossly intact and nonfocal Psychiatric: Normal judgment and insight. Alert and oriented x 3. Normal mood.    Impression and Plan:  Chronic intractable headache, unspecified headache type -Suspect headache is probably a tension headache or maybe even a rebound headache from copious OTC pain medication use, less likely migraine headache. -Have advised her to stop use of OTC medication, will prescribe some Imitrex, will also initiate referral to headache clinic as she states that in the past Botox injections seemed to help.    Patient Instructions  -Nice seeing you today!!  -May try imitrex 50 mg at the onset of a headache, and may take a second tablet 2 hours later if needed.  -Referral to headache clinic placed today.     [name], MD [facility] at [facility]

## 2019-12-07 ENCOUNTER — Ambulatory Visit: Payer: BC Managed Care – PPO | Attending: Internal Medicine

## 2019-12-07 DIAGNOSIS — Z23 Encounter for immunization: Secondary | ICD-10-CM

## 2019-12-07 NOTE — Progress Notes (Signed)
   Covid-19 Vaccination Clinic  Name:  Marie Hanson    MRN: [id number] DOB: February 17, 1981  12/07/2019  [name] was observed post Covid-19 immunization for 15 minutes without incident. She was provided with Vaccine Information Sheet and instruction to access the V-Safe system.   [name] was instructed to call 911 with any severe reactions post vaccine: Marland Kitchen Difficulty breathing  . Swelling of face and throat  . A fast heartbeat  . A bad rash all over body  . Dizziness and weakness   Immunizations Administered    Name Date Dose VIS Date Route   Pfizer COVID-19 Vaccine 12/07/2019 12:56 PM 0.3 mL 10/07/2018 Intramuscular   Manufacturer: [employer], [employer]   Lot: [id number]   NDC: [address]-3

## 2019-12-22 DIAGNOSIS — Z049 Encounter for examination and observation for unspecified reason: Secondary | ICD-10-CM | POA: Diagnosis not present

## 2019-12-22 DIAGNOSIS — G44229 Chronic tension-type headache, not intractable: Secondary | ICD-10-CM | POA: Diagnosis not present

## 2019-12-22 DIAGNOSIS — Z79899 Other long term (current) drug therapy: Secondary | ICD-10-CM | POA: Diagnosis not present

## 2019-12-23 ENCOUNTER — Other Ambulatory Visit: Payer: Self-pay | Admitting: Specialist

## 2019-12-23 DIAGNOSIS — R519 Headache, unspecified: Secondary | ICD-10-CM | POA: Diagnosis not present

## 2019-12-23 DIAGNOSIS — M791 Myalgia, unspecified site: Secondary | ICD-10-CM | POA: Diagnosis not present

## 2019-12-23 DIAGNOSIS — M542 Cervicalgia: Secondary | ICD-10-CM | POA: Diagnosis not present

## 2019-12-23 DIAGNOSIS — G44229 Chronic tension-type headache, not intractable: Secondary | ICD-10-CM | POA: Diagnosis not present

## 2020-01-06 DIAGNOSIS — G44229 Chronic tension-type headache, not intractable: Secondary | ICD-10-CM | POA: Diagnosis not present

## 2020-01-06 DIAGNOSIS — M791 Myalgia, unspecified site: Secondary | ICD-10-CM | POA: Diagnosis not present

## 2020-01-06 DIAGNOSIS — G518 Other disorders of facial nerve: Secondary | ICD-10-CM | POA: Diagnosis not present

## 2020-01-06 DIAGNOSIS — M542 Cervicalgia: Secondary | ICD-10-CM | POA: Diagnosis not present

## 2020-01-16 ENCOUNTER — Ambulatory Visit
Admission: RE | Admit: 2020-01-16 | Discharge: 2020-01-16 | Disposition: A | Discharge status: Home / Self Care | Payer: BC Managed Care – PPO | Source: Ambulatory Visit | Attending: Specialist | Admitting: Specialist

## 2020-01-16 DIAGNOSIS — M4802 Spinal stenosis, cervical region: Secondary | ICD-10-CM | POA: Diagnosis not present

## 2020-01-16 DIAGNOSIS — M542 Cervicalgia: Secondary | ICD-10-CM

## 2020-01-21 DIAGNOSIS — M542 Cervicalgia: Secondary | ICD-10-CM | POA: Diagnosis not present

## 2020-01-21 DIAGNOSIS — G44229 Chronic tension-type headache, not intractable: Secondary | ICD-10-CM | POA: Diagnosis not present

## 2020-01-21 DIAGNOSIS — G518 Other disorders of facial nerve: Secondary | ICD-10-CM | POA: Diagnosis not present

## 2020-01-21 DIAGNOSIS — M791 Myalgia, unspecified site: Secondary | ICD-10-CM | POA: Diagnosis not present

## 2020-02-08 DIAGNOSIS — M791 Myalgia, unspecified site: Secondary | ICD-10-CM | POA: Diagnosis not present

## 2020-02-08 DIAGNOSIS — M542 Cervicalgia: Secondary | ICD-10-CM | POA: Diagnosis not present

## 2020-02-08 DIAGNOSIS — G44229 Chronic tension-type headache, not intractable: Secondary | ICD-10-CM | POA: Diagnosis not present

## 2020-02-08 DIAGNOSIS — G518 Other disorders of facial nerve: Secondary | ICD-10-CM | POA: Diagnosis not present

## 2020-03-01 DIAGNOSIS — G44229 Chronic tension-type headache, not intractable: Secondary | ICD-10-CM | POA: Diagnosis not present

## 2020-03-01 DIAGNOSIS — M791 Myalgia, unspecified site: Secondary | ICD-10-CM | POA: Diagnosis not present

## 2020-03-01 DIAGNOSIS — G518 Other disorders of facial nerve: Secondary | ICD-10-CM | POA: Diagnosis not present

## 2020-03-01 DIAGNOSIS — M542 Cervicalgia: Secondary | ICD-10-CM | POA: Diagnosis not present

## 2020-03-24 DIAGNOSIS — G44229 Chronic tension-type headache, not intractable: Secondary | ICD-10-CM | POA: Diagnosis not present

## 2020-03-24 DIAGNOSIS — M791 Myalgia, unspecified site: Secondary | ICD-10-CM | POA: Diagnosis not present

## 2020-03-24 DIAGNOSIS — G518 Other disorders of facial nerve: Secondary | ICD-10-CM | POA: Diagnosis not present

## 2020-03-24 DIAGNOSIS — M542 Cervicalgia: Secondary | ICD-10-CM | POA: Diagnosis not present

## 2020-03-25 DIAGNOSIS — H43392 Other vitreous opacities, left eye: Secondary | ICD-10-CM | POA: Diagnosis not present

## 2020-03-25 DIAGNOSIS — H33311 Horseshoe tear of retina without detachment, right eye: Secondary | ICD-10-CM | POA: Diagnosis not present

## 2020-04-13 DIAGNOSIS — H33311 Horseshoe tear of retina without detachment, right eye: Secondary | ICD-10-CM | POA: Diagnosis not present

## 2020-05-02 DIAGNOSIS — M542 Cervicalgia: Secondary | ICD-10-CM | POA: Diagnosis not present

## 2020-05-02 DIAGNOSIS — G44229 Chronic tension-type headache, not intractable: Secondary | ICD-10-CM | POA: Diagnosis not present

## 2020-05-02 DIAGNOSIS — M791 Myalgia, unspecified site: Secondary | ICD-10-CM | POA: Diagnosis not present

## 2020-05-02 DIAGNOSIS — G518 Other disorders of facial nerve: Secondary | ICD-10-CM | POA: Diagnosis not present

## 2020-05-10 ENCOUNTER — Other Ambulatory Visit: Payer: Self-pay

## 2020-05-11 ENCOUNTER — Other Ambulatory Visit: Payer: Self-pay

## 2020-05-11 ENCOUNTER — Ambulatory Visit: Payer: BC Managed Care – PPO | Admitting: Internal Medicine

## 2020-05-11 ENCOUNTER — Encounter: Payer: Self-pay | Admitting: Internal Medicine

## 2020-05-11 VITALS — BP 110/80 | HR 73 | Temp 98.5°F | Wt 116.2 lb

## 2020-05-11 DIAGNOSIS — F419 Anxiety disorder, unspecified: Secondary | ICD-10-CM

## 2020-05-11 DIAGNOSIS — G4700 Insomnia, unspecified: Secondary | ICD-10-CM | POA: Diagnosis not present

## 2020-05-11 MED ORDER — SERTRALINE HCL 25 MG PO TABS: 25.0000 mg | ORAL_TABLET | Freq: Every day | ORAL | 1 refills | Status: DC

## 2020-05-11 NOTE — Progress Notes (Signed)
Established Patient Office Visit     This visit occurred during the SARS-CoV-2 public health emergency.  Safety protocols were in place, including screening questions prior to the visit, additional usage of staff PPE, and extensive cleaning of exam room while observing appropriate contact time as indicated for disinfecting solutions.    CC/Reason for Visit: Trouble sleeping  HPI: Marie Hanson is a 39 y.o. female who is coming in today for the above mentioned reasons. Past Medical History is significant for: Anxiety disorder on 10 mg of Lexapro.  She has been having issues sleeping for years.  She has issues both falling asleep and staying asleep because of "thoughts racing through my head".  She feels like her anxiety is not well controlled.  She has tried many over-the-counter sleeping aids including melatonin, ZzzQuil, she has even tried trazodone prescribed by her psychiatrist before without relief.  She goes to bed early at around 730.  She believes she needs a sleep study and is requesting referral today.   Past Medical/Surgical History: Past Medical History:  Diagnosis Date  . Anxiety   . Rosacea     No past surgical history on file.  Social History:  reports that she has never smoked. She has never used smokeless tobacco. She reports that she does not drink alcohol and does not use drugs.  Allergies: No Known Allergies  Family History:  Family History  Problem Relation Age of Onset  . Diabetes Mother   . Hypertension Mother   . Depression Father   . Anxiety disorder Father      Current Outpatient Medications:  .  Azelaic Acid (FINACEA) 15 % cream, After skin is thoroughly washed and patted dry, gently but thoroughly massage a thin film of azelaic acid cream into the affected area twice daily, in the morning and evening., Disp: 30 g, Rfl: 3 .  Azelaic Acid (FINACEA) 15 % cream, Apply a thin film of azelaic acid cream into the affected area twice daily,  Disp: 30 g, Rfl: 1 .  Azelaic Acid 15 % cream, APPLY A THIN FILM INTO THE AFFECTED AREA TWICE A DAY, Disp: 50 g, Rfl: 13 .  drospirenone-ethinyl estradiol (YAZ) 3-0.02 MG tablet, TAKE 1 TABLET DAILY, Disp: 84 tablet, Rfl: 3 .  escitalopram (LEXAPRO) 5 MG tablet, Take 10 mg by mouth daily., Disp: , Rfl:  .  fluticasone (FLONASE) 50 MCG/ACT nasal spray, Place into both nostrils daily., Disp: , Rfl:  .  Melatonin-Pyridoxine (MELATIN PO), Take 5 mg by mouth at bedtime as needed., Disp: , Rfl:  .  Multiple Vitamin (MULTIVITAMIN) tablet, Take 1 tablet by mouth daily., Disp: , Rfl:  .  Omega-3 Fatty Acids (FISH OIL) 1000 MG CAPS, Take by mouth., Disp: , Rfl:  .  sertraline (ZOLOFT) 25 MG tablet, Take 1 tablet (25 mg total) by mouth daily., Disp: 90 tablet, Rfl: 1  Review of Systems:  Constitutional: Denies fever, chills, diaphoresis, appetite change and fatigue.  HEENT: Denies photophobia, eye pain, redness, hearing loss, ear pain, congestion, sore throat, rhinorrhea, sneezing, mouth sores, trouble swallowing, neck pain, neck stiffness and tinnitus.   Respiratory: Denies SOB, DOE, cough, chest tightness,  and wheezing.   Cardiovascular: Denies chest pain, palpitations and leg swelling.  Gastrointestinal: Denies nausea, vomiting, abdominal pain, diarrhea, constipation, blood in stool and abdominal distention.  Genitourinary: Denies dysuria, urgency, frequency, hematuria, flank pain and difficulty urinating.  Endocrine: Denies: hot or cold intolerance, sweats, changes in hair or nails, polyuria, polydipsia.  Musculoskeletal: Denies myalgias, back pain, joint swelling, arthralgias and gait problem.  Skin: Denies pallor, rash and wound.  Neurological: Denies dizziness, seizures, syncope, weakness, light-headedness, numbness and headaches.  Hematological: Denies adenopathy. Easy bruising, personal or family bleeding history  Psychiatric/Behavioral: Denies suicidal ideation, mood changes, confusion and  agitation    Physical Exam: Vitals:   05/11/20 1059  BP: 110/80  Pulse: 73  Temp: 98.5 F (36.9 C)  TempSrc: Oral  SpO2: 99%  Weight: 116 lb 3.2 oz (52.7 kg)    Body mass index is 20.58 kg/m.   Constitutional: NAD, calm, comfortable Eyes: PERRL, lids and conjunctivae normal ENMT: Mucous membranes are moist.  Respiratory: clear to auscultation bilaterally, no wheezing, no crackles. Normal respiratory effort. No accessory muscle use.  Cardiovascular: Regular rate and rhythm, no murmurs / rubs / gallops. No extremity edema.  Neurologic: Grossly intact and nonfocal Psychiatric: Normal judgment and insight. Alert and oriented x 3. Normal mood.    Impression and Plan:  Anxiety Insomnia, unspecified type    Office Visit from 05/11/2020 in [facility] [employer]  PHQ-9 Total Score 8     -I believe that more than likely her insomnia is being triggered by uncontrolled GAD/depression. -I will add low-dose Zoloft 25 mg at nighttime. -She does not have the typical body habitus nor symptoms of obstructive sleep apnea, I believe referral to sleep study is not necessary. -She can continue to use melatonin if she feels necessary. -We have discussed sleep hygiene techniques in detail   Time spent: 30 minutes   Matheu Ploeger Philip Aspen, MD [facility] at [facility]

## 2020-05-20 ENCOUNTER — Encounter: Payer: BC Managed Care – PPO | Admitting: Internal Medicine

## 2020-05-25 ENCOUNTER — Other Ambulatory Visit: Payer: Self-pay

## 2020-05-25 ENCOUNTER — Encounter: Payer: Self-pay | Admitting: Internal Medicine

## 2020-05-25 ENCOUNTER — Ambulatory Visit (INDEPENDENT_AMBULATORY_CARE_PROVIDER_SITE_OTHER): Payer: BC Managed Care – PPO | Admitting: Internal Medicine

## 2020-05-25 VITALS — BP 112/60 | HR 66 | Temp 98.2°F | Ht 63.0 in | Wt 112.9 lb

## 2020-05-25 DIAGNOSIS — L719 Rosacea, unspecified: Secondary | ICD-10-CM

## 2020-05-25 DIAGNOSIS — F411 Generalized anxiety disorder: Secondary | ICD-10-CM

## 2020-05-25 DIAGNOSIS — Z0000 Encounter for general adult medical examination without abnormal findings: Secondary | ICD-10-CM

## 2020-05-25 DIAGNOSIS — G4700 Insomnia, unspecified: Secondary | ICD-10-CM | POA: Diagnosis not present

## 2020-05-25 MED ORDER — SERTRALINE HCL 50 MG PO TABS: 50.0000 mg | ORAL_TABLET | Freq: Every day | ORAL | 1 refills | Status: DC

## 2020-05-25 MED ORDER — AZELAIC ACID 15 % EX GEL
CUTANEOUS | 3 refills | Status: DC
Start: 2020-05-25 — End: 2021-01-26

## 2020-05-25 NOTE — Progress Notes (Signed)
Established Patient Office Visit     This visit occurred during the SARS-CoV-2 public health emergency.  Safety protocols were in place, including screening questions prior to the visit, additional usage of staff PPE, and extensive cleaning of exam room while observing appropriate contact time as indicated for disinfecting solutions.    CC/Reason for Visit: Annual preventive exam  HPI: Marie Hanson is a 39 y.o. female who is coming in today for the above mentioned reasons. Past Medical History is significant for: Generalized anxiety disorder and insomnia recently started on Zoloft 25 mg at bedtime, she feels like this is working well for her but is requesting dose increase. She has rosacea and uses azaleic acid. She had a Pap smear in [place] in 2019 that was normal.  She has no acute complaints today.   Past Medical/Surgical History: Past Medical History:  Diagnosis Date  . Anxiety   . Rosacea     History reviewed. No pertinent surgical history.  Social History:  reports that she has never smoked. She has never used smokeless tobacco. She reports that she does not drink alcohol and does not use drugs.  Allergies: No Known Allergies  Family History:  Family History  Problem Relation Age of Onset  . Diabetes Mother   . Hypertension Mother   . Depression Father   . Anxiety disorder Father      Current Outpatient Medications:  .  Azelaic Acid (FINACEA) 15 % cream, After skin is thoroughly washed and patted dry, gently but thoroughly massage a thin film of azelaic acid cream into the affected area twice daily, in the morning and evening., Disp: 30 g, Rfl: 3 .  drospirenone-ethinyl estradiol (YAZ) 3-0.02 MG tablet, TAKE 1 TABLET DAILY, Disp: 84 tablet, Rfl: 3 .  fluticasone (FLONASE) 50 MCG/ACT nasal spray, Place into both nostrils daily., Disp: , Rfl:  .  Melatonin-Pyridoxine (MELATIN PO), Take 5 mg by mouth at bedtime as needed., Disp: , Rfl:  .  Multiple  Vitamin (MULTIVITAMIN) tablet, Take 1 tablet by mouth daily., Disp: , Rfl:  .  Omega-3 Fatty Acids (FISH OIL) 1000 MG CAPS, Take by mouth., Disp: , Rfl:  .  sertraline (ZOLOFT) 50 MG tablet, Take 1 tablet (50 mg total) by mouth daily., Disp: 90 tablet, Rfl: 1  Review of Systems:  Constitutional: Denies fever, chills, diaphoresis, appetite change and fatigue.  HEENT: Denies photophobia, eye pain, redness, hearing loss, ear pain, congestion, sore throat, rhinorrhea, sneezing, mouth sores, trouble swallowing, neck pain, neck stiffness and tinnitus.   Respiratory: Denies SOB, DOE, cough, chest tightness,  and wheezing.   Cardiovascular: Denies chest pain, palpitations and leg swelling.  Gastrointestinal: Denies nausea, vomiting, abdominal pain, diarrhea, constipation, blood in stool and abdominal distention.  Genitourinary: Denies dysuria, urgency, frequency, hematuria, flank pain and difficulty urinating.  Endocrine: Denies: hot or cold intolerance, sweats, changes in hair or nails, polyuria, polydipsia. Musculoskeletal: Denies myalgias, back pain, joint swelling, arthralgias and gait problem.  Skin: Denies pallor, rash and wound.  Neurological: Denies dizziness, seizures, syncope, weakness, light-headedness, numbness and headaches.  Hematological: Denies adenopathy. Easy bruising, personal or family bleeding history  Psychiatric/Behavioral: Denies suicidal ideation, mood changes, confusion, nervousness, sleep disturbance and agitation    Physical Exam: Vitals:   05/25/20 0705  BP: 112/60  Pulse: 66  Temp: 98.2 F (36.8 C)  TempSrc: Oral  SpO2: 99%  Weight: 112 lb 14.4 oz (51.2 kg)  Height: $Remove'5\' 3"'juAmVmT$  (1.6 m)    Body mass index  is 20 kg/m.   Constitutional: NAD, calm, comfortable Eyes: PERRL, lids and conjunctivae normal ENMT: Mucous membranes are moist. Posterior pharynx clear of any exudate or lesions. Normal dentition. Tympanic membrane is pearly white, no erythema or  bulging. Neck: normal, supple, no masses, no thyromegaly Respiratory: clear to auscultation bilaterally, no wheezing, no crackles. Normal respiratory effort. No accessory muscle use.  Cardiovascular: Regular rate and rhythm, no murmurs / rubs / gallops. No extremity edema. 2+ pedal pulses. No carotid bruits.  Abdomen: no tenderness, no masses palpated. No hepatosplenomegaly. Bowel sounds positive.  Musculoskeletal: no clubbing / cyanosis. No joint deformity upper and lower extremities. Good ROM, no contractures. Normal muscle tone.  Skin: no rashes, lesions, ulcers. No induration Neurologic: CN 2-12 grossly intact. Sensation intact, DTR normal. Strength 5/5 in all 4.  Psychiatric: Normal judgment and insight. Alert and oriented x 3. Normal mood.    Impression and Plan:  Encounter for preventive health examination  -She has routine eye and dental care. -Immunizations are up-to-date and age-appropriate including Covid. -Screening labs today. -Healthy lifestyle discussed in detail. -Commence routine breast cancer screening age 66 and colon cancer screening age 80. -Will be due for Pap smear in 2022.  GAD (generalized anxiety disorder) Insomnia, unspecified type  -Increase Zoloft from 25 to 50 mg, she has stopped taking Lexapro. -She feels like her insomnia is improved but could use some additional help with her anxiety.  Rosacea -Refill azaleic acid.    Patient Instructions  -Nice seeing you today!!  -Lab work today; will notify you once results are available.  -Increase Zoloft to 50 mg at bedtime.  -Schedule follow up in 4 months.   Preventive Care 58-16 Years Old, Female Preventive care refers to visits with your health care provider and lifestyle choices that can promote health and wellness. This includes:  A yearly physical exam. This may also be called an annual well check.  Regular dental visits and eye exams.  Immunizations.  Screening for certain  conditions.  Healthy lifestyle choices, such as eating a healthy diet, getting regular exercise, not using drugs or products that contain nicotine and tobacco, and limiting alcohol use. What can I expect for my preventive care visit? Physical exam Your health care provider will check your:  Height and weight. This may be used to calculate body mass index (BMI), which tells if you are at a healthy weight.  Heart rate and blood pressure.  Skin for abnormal spots. Counseling Your health care provider may ask you questions about your:  Alcohol, tobacco, and drug use.  Emotional well-being.  Home and relationship well-being.  Sexual activity.  Eating habits.  Work and work Statistician.  Method of birth control.  Menstrual cycle.  Pregnancy history. What immunizations do I need?  Influenza (flu) vaccine  This is recommended every year. Tetanus, diphtheria, and pertussis (Tdap) vaccine  You may need a Td booster every 10 years. Varicella (chickenpox) vaccine  You may need this if you have not been vaccinated. Human papillomavirus (HPV) vaccine  If recommended by your health care provider, you may need three doses over 6 months. Measles, mumps, and rubella (MMR) vaccine  You may need at least one dose of MMR. You may also need a second dose. Meningococcal conjugate (MenACWY) vaccine  One dose is recommended if you are age 6-21 years and a first-year college student living in a residence hall, or if you have one of several medical conditions. You may also need additional booster doses. Pneumococcal conjugate (  PCV13) vaccine  You may need this if you have certain conditions and were not previously vaccinated. Pneumococcal polysaccharide (PPSV23) vaccine  You may need one or two doses if you smoke cigarettes or if you have certain conditions. Hepatitis A vaccine  You may need this if you have certain conditions or if you travel or work in places where you may be  exposed to hepatitis A. Hepatitis B vaccine  You may need this if you have certain conditions or if you travel or work in places where you may be exposed to hepatitis B. Haemophilus influenzae type b (Hib) vaccine  You may need this if you have certain conditions. You may receive vaccines as individual doses or as more than one vaccine together in one shot (combination vaccines). Talk with your health care provider about the risks and benefits of combination vaccines. What tests do I need?  Blood tests  Lipid and cholesterol levels. These may be checked every 5 years starting at age 80.  Hepatitis C test.  Hepatitis B test. Screening  Diabetes screening. This is done by checking your blood sugar (glucose) after you have not eaten for a while (fasting).  Sexually transmitted disease (STD) testing.  BRCA-related cancer screening. This may be done if you have a family history of breast, ovarian, tubal, or peritoneal cancers.  Pelvic exam and Pap test. This may be done every 3 years starting at age 40. Starting at age 30, this may be done every 5 years if you have a Pap test in combination with an HPV test. Talk with your health care provider about your test results, treatment options, and if necessary, the need for more tests. Follow these instructions at home: Eating and drinking   Eat a diet that includes fresh fruits and vegetables, whole grains, lean protein, and low-fat dairy.  Take vitamin and mineral supplements as recommended by your health care provider.  Do not drink alcohol if: ? Your health care provider tells you not to drink. ? You are pregnant, may be pregnant, or are planning to become pregnant.  If you drink alcohol: ? Limit how much you have to 0-1 drink a day. ? Be aware of how much alcohol is in your drink. In the [place] one drink equals one 12 oz bottle of beer (355 mL), one 5 oz glass of wine (148 mL), or one 1 oz glass of hard liquor (44  mL). Lifestyle  Take daily care of your teeth and gums.  Stay active. Exercise for at least 30 minutes on 5 or more days each week.  Do not use any products that contain nicotine or tobacco, such as cigarettes, e-cigarettes, and chewing tobacco. If you need help quitting, ask your health care provider.  If you are sexually active, practice safe sex. Use a condom or other form of birth control (contraception) in order to prevent pregnancy and STIs (sexually transmitted infections). If you plan to become pregnant, see your health care provider for a preconception visit. What's next?  Visit your health care provider once a year for a well check visit.  Ask your health care provider how often you should have your eyes and teeth checked.  Stay up to date on all vaccines. This information is not intended to replace advice given to you by your health care provider. Make sure you discuss any questions you have with your health care provider. Document Revised: 04/10/2018 Document Reviewed: 04/10/2018 Elsevier Patient Education  2020 [employer].  Eligha Kmetz Hernandez Acosta, MD [facility]   

## 2020-05-25 NOTE — Patient Instructions (Signed)
-Nice seeing you today!!  -Lab work today; will notify you once results are available.  -Increase Zoloft to 50 mg at bedtime.  -Schedule follow up in 4 months.   Preventive Care 21-39 Years Old, Female Preventive care refers to visits with your health care provider and lifestyle choices that can promote health and wellness. This includes:  A yearly physical exam. This may also be called an annual well check.  Regular dental visits and eye exams.  Immunizations.  Screening for certain conditions.  Healthy lifestyle choices, such as eating a healthy diet, getting regular exercise, not using drugs or products that contain nicotine and tobacco, and limiting alcohol use. What can I expect for my preventive care visit? Physical exam Your health care provider will check your:  Height and weight. This may be used to calculate body mass index (BMI), which tells if you are at a healthy weight.  Heart rate and blood pressure.  Skin for abnormal spots. Counseling Your health care provider may ask you questions about your:  Alcohol, tobacco, and drug use.  Emotional well-being.  Home and relationship well-being.  Sexual activity.  Eating habits.  Work and work environment.  Method of birth control.  Menstrual cycle.  Pregnancy history. What immunizations do I need?  Influenza (flu) vaccine  This is recommended every year. Tetanus, diphtheria, and pertussis (Tdap) vaccine  You may need a Td booster every 10 years. Varicella (chickenpox) vaccine  You may need this if you have not been vaccinated. Human papillomavirus (HPV) vaccine  If recommended by your health care provider, you may need three doses over 6 months. Measles, mumps, and rubella (MMR) vaccine  You may need at least one dose of MMR. You may also need a second dose. Meningococcal conjugate (MenACWY) vaccine  One dose is recommended if you are age 19-21 years and a first-year college student living in  a residence hall, or if you have one of several medical conditions. You may also need additional booster doses. Pneumococcal conjugate (PCV13) vaccine  You may need this if you have certain conditions and were not previously vaccinated. Pneumococcal polysaccharide (PPSV23) vaccine  You may need one or two doses if you smoke cigarettes or if you have certain conditions. Hepatitis A vaccine  You may need this if you have certain conditions or if you travel or work in places where you may be exposed to hepatitis A. Hepatitis B vaccine  You may need this if you have certain conditions or if you travel or work in places where you may be exposed to hepatitis B. Haemophilus influenzae type b (Hib) vaccine  You may need this if you have certain conditions. You may receive vaccines as individual doses or as more than one vaccine together in one shot (combination vaccines). Talk with your health care provider about the risks and benefits of combination vaccines. What tests do I need?  Blood tests  Lipid and cholesterol levels. These may be checked every 5 years starting at age 20.  Hepatitis C test.  Hepatitis B test. Screening  Diabetes screening. This is done by checking your blood sugar (glucose) after you have not eaten for a while (fasting).  Sexually transmitted disease (STD) testing.  BRCA-related cancer screening. This may be done if you have a family history of breast, ovarian, tubal, or peritoneal cancers.  Pelvic exam and Pap test. This may be done every 3 years starting at age 21. Starting at age 30, this may be done every 5 years   if you have a Pap test in combination with an HPV test. Talk with your health care provider about your test results, treatment options, and if necessary, the need for more tests. Follow these instructions at home: Eating and drinking   Eat a diet that includes fresh fruits and vegetables, whole grains, lean protein, and low-fat dairy.  Take  vitamin and mineral supplements as recommended by your health care provider.  Do not drink alcohol if: ? Your health care provider tells you not to drink. ? You are pregnant, may be pregnant, or are planning to become pregnant.  If you drink alcohol: ? Limit how much you have to 0-1 drink a day. ? Be aware of how much alcohol is in your drink. In the [place] one drink equals one 12 oz bottle of beer (355 mL), one 5 oz glass of wine (148 mL), or one 1 oz glass of hard liquor (44 mL). Lifestyle  Take daily care of your teeth and gums.  Stay active. Exercise for at least 30 minutes on 5 or more days each week.  Do not use any products that contain nicotine or tobacco, such as cigarettes, e-cigarettes, and chewing tobacco. If you need help quitting, ask your health care provider.  If you are sexually active, practice safe sex. Use a condom or other form of birth control (contraception) in order to prevent pregnancy and STIs (sexually transmitted infections). If you plan to become pregnant, see your health care provider for a preconception visit. What's next?  Visit your health care provider once a year for a well check visit.  Ask your health care provider how often you should have your eyes and teeth checked.  Stay up to date on all vaccines. This information is not intended to replace advice given to you by your health care provider. Make sure you discuss any questions you have with your health care provider. Document Revised: 04/10/2018 Document Reviewed: 04/10/2018 Elsevier Patient Education  2020 [employer].

## 2020-05-26 LAB — CBC WITH DIFFERENTIAL/PLATELET
Absolute Monocytes: 218 cells/uL (ref 200–950)
Basophils Absolute: 21 cells/uL (ref 0–200)
Basophils Relative: 0.4 %
Eosinophils Absolute: 21 cells/uL (ref 15–500)
Eosinophils Relative: 0.4 %
HCT: 43.9 % (ref 35.0–45.0)
Hemoglobin: 14.5 g/dL (ref 11.7–15.5)
Lymphs Abs: 1716 cells/uL (ref 850–3900)
MCH: 30.3 pg (ref 27.0–33.0)
MCHC: 33 g/dL (ref 32.0–36.0)
MCV: 91.6 fL (ref 80.0–100.0)
MPV: 10.4 fL (ref 7.5–12.5)
Monocytes Relative: 4.2 %
Neutro Abs: 3224 cells/uL (ref 1500–7800)
Neutrophils Relative %: 62 %
Platelets: 201 10*3/uL (ref 140–400)
RBC: 4.79 10*6/uL (ref 3.80–5.10)
RDW: 12.9 % (ref 11.0–15.0)
Total Lymphocyte: 33 %
WBC: 5.2 10*3/uL (ref 3.8–10.8)

## 2020-05-26 LAB — COMPREHENSIVE METABOLIC PANEL
AG Ratio: 1.8 (calc) (ref 1.0–2.5)
ALT: 22 U/L (ref 6–29)
AST: 16 U/L (ref 10–30)
Albumin: 4.6 g/dL (ref 3.6–5.1)
Alkaline phosphatase (APISO): 44 U/L (ref 31–125)
BUN: 11 mg/dL (ref 7–25)
CO2: 27 mmol/L (ref 20–32)
Calcium: 9.5 mg/dL (ref 8.6–10.2)
Chloride: 104 mmol/L (ref 98–110)
Creat: 1.04 mg/dL (ref 0.50–1.10)
Globulin: 2.6 g/dL (calc) (ref 1.9–3.7)
Glucose, Bld: 105 mg/dL — ABNORMAL HIGH (ref 65–99)
Potassium: 4.5 mmol/L (ref 3.5–5.3)
Sodium: 141 mmol/L (ref 135–146)
Total Bilirubin: 0.5 mg/dL (ref 0.2–1.2)
Total Protein: 7.2 g/dL (ref 6.1–8.1)

## 2020-05-26 LAB — LIPID PANEL
Cholesterol: 155 mg/dL (ref <200)
HDL: 95 mg/dL (ref >=50)
LDL Cholesterol (Calc): 44 mg/dL (calc)
Non-HDL Cholesterol (Calc): 60 mg/dL (calc) (ref <130)
Total CHOL/HDL Ratio: 1.6 (calc) (ref <5.0)
Triglycerides: 82 mg/dL (ref <150)

## 2020-05-26 LAB — TSH: TSH: 1.28 mIU/L

## 2020-05-26 LAB — VITAMIN B12: Vitamin B-12: 425 pg/mL (ref 200–1100)

## 2020-05-26 LAB — VITAMIN D 25 HYDROXY (VIT D DEFICIENCY, FRACTURES): Vit D, 25-Hydroxy: 91 ng/mL (ref 30–100)

## 2020-05-27 ENCOUNTER — Encounter: Payer: Self-pay | Admitting: Internal Medicine

## 2020-06-02 ENCOUNTER — Telehealth (INDEPENDENT_AMBULATORY_CARE_PROVIDER_SITE_OTHER): Payer: BC Managed Care – PPO | Admitting: Internal Medicine

## 2020-06-02 ENCOUNTER — Encounter: Payer: Self-pay | Admitting: Internal Medicine

## 2020-06-02 VITALS — Ht 63.0 in | Wt 113.0 lb

## 2020-06-02 DIAGNOSIS — F419 Anxiety disorder, unspecified: Secondary | ICD-10-CM | POA: Diagnosis not present

## 2020-06-02 NOTE — Progress Notes (Signed)
Virtual Visit via Video Note  I connected with Marie Hanson on 06/02/20 at  3:45 PM EDT by a video enabled telemedicine application and verified that I am speaking with the correct person using two identifiers.  Location patient: home Location provider: work office Persons participating in the virtual visit: patient, provider  I discussed the limitations of evaluation and management by telemedicine and the availability of in person appointments. The patient expressed understanding and agreed to proceed.   HPI: She scheduled this visit to discuss labs that were drawn on 10/21 during her CPE. She was sent a result note disclosing that labs were normal, but she had a concern as her glucose was flagged as being abnormal by the lab at 105 and her mother is a prediabetic.   ROS: Constitutional: Denies fever, chills, diaphoresis, appetite change and fatigue.  HEENT: Denies photophobia, eye pain, redness, hearing loss, ear pain, congestion, sore throat, rhinorrhea, sneezing, mouth sores, trouble swallowing, neck pain, neck stiffness and tinnitus.   Respiratory: Denies SOB, DOE, cough, chest tightness,  and wheezing.   Cardiovascular: Denies chest pain, palpitations and leg swelling.  Gastrointestinal: Denies nausea, vomiting, abdominal pain, diarrhea, constipation, blood in stool and abdominal distention.  Genitourinary: Denies dysuria, urgency, frequency, hematuria, flank pain and difficulty urinating.  Endocrine: Denies: hot or cold intolerance, sweats, changes in hair or nails, polyuria, polydipsia. Musculoskeletal: Denies myalgias, back pain, joint swelling, arthralgias and gait problem.  Skin: Denies pallor, rash and wound.  Neurological: Denies dizziness, seizures, syncope, weakness, light-headedness, numbness and headaches.  Hematological: Denies adenopathy. Easy bruising, personal or family bleeding history  Psychiatric/Behavioral: Denies suicidal ideation, mood changes,  confusion, nervousness, sleep disturbance and agitation   Past Medical History:  Diagnosis Date  . Anxiety   . Rosacea     No past surgical history on file.  Family History  Problem Relation Age of Onset  . Diabetes Mother   . Hypertension Mother   . Depression Father   . Anxiety disorder Father     SOCIAL HX:   reports that she has never smoked. She has never used smokeless tobacco. She reports that she does not drink alcohol and does not use drugs.   Current Outpatient Medications:  .  Azelaic Acid (FINACEA) 15 % cream, After skin is thoroughly washed and patted dry, gently but thoroughly massage a thin film of azelaic acid cream into the affected area twice daily, in the morning and evening., Disp: 30 g, Rfl: 3 .  drospirenone-ethinyl estradiol (YAZ) 3-0.02 MG tablet, TAKE 1 TABLET DAILY, Disp: 84 tablet, Rfl: 3 .  fluticasone (FLONASE) 50 MCG/ACT nasal spray, Place into both nostrils daily., Disp: , Rfl:  .  Melatonin-Pyridoxine (MELATIN PO), Take 5 mg by mouth at bedtime as needed., Disp: , Rfl:  .  Multiple Vitamin (MULTIVITAMIN) tablet, Take 1 tablet by mouth daily., Disp: , Rfl:  .  Omega-3 Fatty Acids (FISH OIL) 1000 MG CAPS, Take by mouth., Disp: , Rfl:  .  sertraline (ZOLOFT) 50 MG tablet, Take 1 tablet (50 mg total) by mouth daily., Disp: 90 tablet, Rfl: 1  EXAM:   VITALS per patient if applicable: none reported  GENERAL: alert, oriented, appears well and in no acute distress  HEENT: atraumatic, conjunttiva clear, no obvious abnormalities on inspection of external nose and ears  NECK: normal movements of the head and neck  LUNGS: on inspection no signs of respiratory distress, breathing rate appears normal, no obvious gross increased work of breathing, gasping  or wheezing  CV: no obvious cyanosis  MS: moves all visible extremities without noticeable abnormality  PSYCH/NEURO: pleasant and cooperative, no obvious depression or anxiety, speech and thought  processing grossly intact  ASSESSMENT AND PLAN:   Encounter for Preventive Health -No concern about glucose of 105. Explained normal glucose is considered to be 70-110. No further concerns.    I discussed the assessment and treatment plan with the patient. The patient was provided an opportunity to ask questions and all were answered. The patient agreed with the plan and demonstrated an understanding of the instructions.   The patient was advised to call back or seek an in-person evaluation if the symptoms worsen or if the condition fails to improve as anticipated.    [name], MD  [facility]

## 2020-06-13 DIAGNOSIS — M791 Myalgia, unspecified site: Secondary | ICD-10-CM | POA: Diagnosis not present

## 2020-06-13 DIAGNOSIS — G44229 Chronic tension-type headache, not intractable: Secondary | ICD-10-CM | POA: Diagnosis not present

## 2020-06-13 DIAGNOSIS — M542 Cervicalgia: Secondary | ICD-10-CM | POA: Diagnosis not present

## 2020-06-13 DIAGNOSIS — G518 Other disorders of facial nerve: Secondary | ICD-10-CM | POA: Diagnosis not present

## 2020-08-08 DIAGNOSIS — M791 Myalgia, unspecified site: Secondary | ICD-10-CM | POA: Diagnosis not present

## 2020-08-08 DIAGNOSIS — G44229 Chronic tension-type headache, not intractable: Secondary | ICD-10-CM | POA: Diagnosis not present

## 2020-08-08 DIAGNOSIS — M542 Cervicalgia: Secondary | ICD-10-CM | POA: Diagnosis not present

## 2020-08-08 DIAGNOSIS — G518 Other disorders of facial nerve: Secondary | ICD-10-CM | POA: Diagnosis not present

## 2020-08-11 ENCOUNTER — Ambulatory Visit: Payer: BC Managed Care – PPO | Admitting: Internal Medicine

## 2020-08-23 DIAGNOSIS — F331 Major depressive disorder, recurrent, moderate: Secondary | ICD-10-CM | POA: Diagnosis not present

## 2020-08-30 DIAGNOSIS — F331 Major depressive disorder, recurrent, moderate: Secondary | ICD-10-CM | POA: Diagnosis not present

## 2020-09-05 ENCOUNTER — Other Ambulatory Visit: Payer: Self-pay

## 2020-09-05 ENCOUNTER — Encounter: Payer: Self-pay | Admitting: Nurse Practitioner

## 2020-09-05 ENCOUNTER — Ambulatory Visit: Payer: BC Managed Care – PPO | Admitting: Nurse Practitioner

## 2020-09-05 ENCOUNTER — Other Ambulatory Visit (HOSPITAL_COMMUNITY)
Admission: RE | Admit: 2020-09-05 | Discharge: 2020-09-05 | Disposition: A | Discharge status: Home / Self Care | Payer: BC Managed Care – PPO | Source: Ambulatory Visit | Attending: Nurse Practitioner | Admitting: Nurse Practitioner

## 2020-09-05 ENCOUNTER — Other Ambulatory Visit: Payer: Self-pay | Admitting: Nurse Practitioner

## 2020-09-05 VITALS — Ht 63.25 in | Wt 112.0 lb

## 2020-09-05 DIAGNOSIS — Z3041 Encounter for surveillance of contraceptive pills: Secondary | ICD-10-CM | POA: Diagnosis not present

## 2020-09-05 DIAGNOSIS — R739 Hyperglycemia, unspecified: Secondary | ICD-10-CM

## 2020-09-05 DIAGNOSIS — L292 Pruritus vulvae: Secondary | ICD-10-CM

## 2020-09-05 DIAGNOSIS — Z01419 Encounter for gynecological examination (general) (routine) without abnormal findings: Secondary | ICD-10-CM

## 2020-09-05 DIAGNOSIS — Z113 Encounter for screening for infections with a predominantly sexual mode of transmission: Secondary | ICD-10-CM

## 2020-09-05 DIAGNOSIS — B3731 Acute candidiasis of vulva and vagina: Secondary | ICD-10-CM

## 2020-09-05 DIAGNOSIS — Z131 Encounter for screening for diabetes mellitus: Secondary | ICD-10-CM | POA: Diagnosis not present

## 2020-09-05 DIAGNOSIS — B373 Candidiasis of vulva and vagina: Secondary | ICD-10-CM

## 2020-09-05 LAB — WET PREP FOR TRICH, YEAST, CLUE

## 2020-09-05 MED ORDER — TERCONAZOLE 0.4 % VA CREA: 1.0000 | TOPICAL_CREAM | Freq: Every day | VAGINAL | 0 refills | Status: DC

## 2020-09-05 MED ORDER — DROSPIRENONE-ETHINYL ESTRADIOL 3-0.02 MG PO TABS: 1.0000 | ORAL_TABLET | Freq: Every day | ORAL | 4 refills | Status: DC

## 2020-09-05 NOTE — Patient Instructions (Signed)
Health Maintenance, Female Adopting a healthy lifestyle and getting preventive care are important in promoting health and wellness. Ask your health care provider about:  The right schedule for you to have regular tests and exams.  Things you can do on your own to prevent diseases and keep yourself healthy. What should I know about diet, weight, and exercise? Eat a healthy diet  Eat a diet that includes plenty of vegetables, fruits, low-fat dairy products, and lean protein.  Do not eat a lot of foods that are high in solid fats, added sugars, or sodium.   Maintain a healthy weight Body mass index (BMI) is used to identify weight problems. It estimates body fat based on height and weight. Your health care provider can help determine your BMI and help you achieve or maintain a healthy weight. Get regular exercise Get regular exercise. This is one of the most important things you can do for your health. Most adults should:  Exercise for at least 150 minutes each week. The exercise should increase your heart rate and make you sweat (moderate-intensity exercise).  Do strengthening exercises at least twice a week. This is in addition to the moderate-intensity exercise.  Spend less time sitting. Even light physical activity can be beneficial. Watch cholesterol and blood lipids Have your blood tested for lipids and cholesterol at 39 years of age, then have this test every 5 years. Have your cholesterol levels checked more often if:  Your lipid or cholesterol levels are high.  You are older than 40 years of age.  You are at high risk for heart disease. What should I know about cancer screening? Depending on your health history and family history, you may need to have cancer screening at various ages. This may include screening for:  Breast cancer.  Cervical cancer.  Colorectal cancer.  Skin cancer.  Lung cancer. What should I know about heart disease, diabetes, and high blood  pressure? Blood pressure and heart disease  High blood pressure causes heart disease and increases the risk of stroke. This is more likely to develop in people who have high blood pressure readings, are of African descent, or are overweight.  Have your blood pressure checked: ? Every 3-5 years if you are 18-39 years of age. ? Every year if you are 40 years old or older. Diabetes Have regular diabetes screenings. This checks your fasting blood sugar level. Have the screening done:  Once every three years after age 40 if you are at a normal weight and have a low risk for diabetes.  More often and at a younger age if you are overweight or have a high risk for diabetes. What should I know about preventing infection? Hepatitis B If you have a higher risk for hepatitis B, you should be screened for this virus. Talk with your health care provider to find out if you are at risk for hepatitis B infection. Hepatitis C Testing is recommended for:  Everyone born from 1945 through 1965.  Anyone with known risk factors for hepatitis C. Sexually transmitted infections (STIs)  Get screened for STIs, including gonorrhea and chlamydia, if: ? You are sexually active and are younger than 39 years of age. ? You are older than 39 years of age and your health care provider tells you that you are at risk for this type of infection. ? Your sexual activity has changed since you were last screened, and you are at increased risk for chlamydia or gonorrhea. Ask your health care provider   if you are at risk.  Ask your health care provider about whether you are at high risk for HIV. Your health care provider may recommend a prescription medicine to help prevent HIV infection. If you choose to take medicine to prevent HIV, you should first get tested for HIV. You should then be tested every 3 months for as long as you are taking the medicine. Pregnancy  If you are about to stop having your period (premenopausal) and  you may become pregnant, seek counseling before you get pregnant.  Take 400 to 800 micrograms (mcg) of folic acid every day if you become pregnant.  Ask for birth control (contraception) if you want to prevent pregnancy. Osteoporosis and menopause Osteoporosis is a disease in which the bones lose minerals and strength with aging. This can result in bone fractures. If you are 65 years old or older, or if you are at risk for osteoporosis and fractures, ask your health care provider if you should:  Be screened for bone loss.  Take a calcium or vitamin D supplement to lower your risk of fractures.  Be given hormone replacement therapy (HRT) to treat symptoms of menopause. Follow these instructions at home: Lifestyle  Do not use any products that contain nicotine or tobacco, such as cigarettes, e-cigarettes, and chewing tobacco. If you need help quitting, ask your health care provider.  Do not use street drugs.  Do not share needles.  Ask your health care provider for help if you need support or information about quitting drugs. Alcohol use  Do not drink alcohol if: ? Your health care provider tells you not to drink. ? You are pregnant, may be pregnant, or are planning to become pregnant.  If you drink alcohol: ? Limit how much you use to 0-1 drink a day. ? Limit intake if you are breastfeeding.  Be aware of how much alcohol is in your drink. In the [place] one drink equals one 12 oz bottle of beer (355 mL), one 5 oz glass of wine (148 mL), or one 1 oz glass of hard liquor (44 mL). General instructions  Schedule regular health, dental, and eye exams.  Stay current with your vaccines.  Tell your health care provider if: ? You often feel depressed. ? You have ever been abused or do not feel safe at home. Summary  Adopting a healthy lifestyle and getting preventive care are important in promoting health and wellness.  Follow your health care provider's instructions about healthy  diet, exercising, and getting tested or screened for diseases.  Follow your health care provider's instructions on monitoring your cholesterol and blood pressure. This information is not intended to replace advice given to you by your health care provider. Make sure you discuss any questions you have with your health care provider. Document Revised: 07/23/2018 Document Reviewed: 07/23/2018 Elsevier Patient Education  2021 Elsevier Inc.  

## 2020-09-05 NOTE — Progress Notes (Signed)
40 y.o. G0P0000 Married   White or Caucasian [place] female here for annual exam.    Last week started with a yeast infection. Had telemedicine calls and was treated with Diflucan. The problem improved a little but then did not resolve. She called back and was given Rx for 4 more Diflucan. She is still experiencing vulvar irritation.    Patient's last menstrual period was 08/13/2020 (exact date).          Sexually active: Yes.    The current method of family planning is OCP (estrogen/progesterone).    Exercising: Yes.    cardio, weights, spin bike Smoker:  no  Health Maintenance: Pap:  2019 neg per patient History of abnormal Pap:  no MMG:  none Colonoscopy:  none  BMD:   none TDaP:  2020 Gardasil:   completed Covid-19: pfizer Hep C testing: not done Screening Labs: with PCP   reports that she has never smoked. She has never used smokeless tobacco. She reports previous alcohol use. She reports that she does not use drugs.  Past Medical History:  Diagnosis Date  . Anxiety   . Depression   . Rosacea     Past Surgical History:  Procedure Laterality Date  . AUGMENTATION MAMMAPLASTY     breast enhancement & removal    Current Outpatient Medications  Medication Sig Dispense Refill  . Azelaic Acid (FINACEA) 15 % cream After skin is thoroughly washed and patted dry, gently but thoroughly massage a thin film of azelaic acid cream into the affected area twice daily, in the morning and evening. 30 g 3  . drospirenone-ethinyl estradiol (YAZ) 3-0.02 MG tablet TAKE 1 TABLET DAILY 84 tablet 3  . fluconazole (DIFLUCAN) 150 MG tablet Take 150 mg by mouth once.    . fluticasone (FLONASE) 50 MCG/ACT nasal spray Place into both nostrils daily.    . Multiple Vitamin (MULTIVITAMIN) tablet Take 1 tablet by mouth daily.    . Omega-3 Fatty Acids (FISH OIL) 1000 MG CAPS Take by mouth.    . RESTASIS 0.05 % ophthalmic emulsion     . sertraline (ZOLOFT) 25 MG tablet Take 25 mg by mouth daily.      No current facility-administered medications for this visit.    Family History  Problem Relation Age of Onset  . Diabetes Mother   . Depression Father   . Anxiety disorder Father     Review of Systems  Constitutional: Negative.   HENT: Negative.   Eyes: Negative.   Respiratory: Negative.   Cardiovascular: Negative.   Gastrointestinal: Negative.   Endocrine: Negative.   Genitourinary:       Vaginal discharge  Musculoskeletal: Negative.   Skin: Negative.   Allergic/Immunologic: Negative.   Neurological: Negative.   Hematological: Negative.   Psychiatric/Behavioral: Negative.     Exam:   Ht 5' 3.25" (1.607 m)   Wt 112 lb (50.8 kg)   LMP 08/13/2020 (Exact Date)   BMI 19.68 kg/m   Height: 5' 3.25" (160.7 cm)  General appearance: alert, cooperative and appears stated age, no acute distress Head: Normocephalic, without obvious abnormality Neck: no adenopathy, thyroid normal to inspection and palpation Lungs: clear to auscultation bilaterally Breasts: No axillary or supraclavicular adenopathy, Normal to palpation without dominant masses. Had Augmentation surgery (through navel, but had implants removed 1 year later) Heart: regular rate and rhythm Abdomen: soft, non-tender; no masses,  no organomegaly Extremities: extremities normal, no edema Skin: No rashes or lesions Lymph nodes: Cervical, supraclavicular, and axillary nodes normal.  No abnormal inguinal nodes palpated Neurologic: Grossly normal   Pelvic: External genitalia:  no lesions              Urethra:  normal appearing urethra with no masses, tenderness or lesions              Bartholins and Skenes: normal                 Vagina: normal appearing vagina, appropriate for age, normal appearing discharge, no lesions              Cervix: neg cervical motion tenderness, no visible lesions             Bimanual Exam:   Uterus:  normal size, contour, position, consistency, mobility, non-tender              Adnexa:  no mass, fullness, tenderness                 Joy, CMA Chaperone was present for exam.  A:  Well woman exam with routine gynecological exam - Plan: Cytology - PAP( [place])  Vulvar itching - Plan: WET PREP FOR TRICH, YEAST, CLUE  Surveillance of previously prescribed contraceptive pill - Plan: drospirenone-ethinyl estradiol (YAZ) 3-0.02 MG tablet  Candidal vulvovaginitis - Plan: Candidiasis, PCR, terconazole (TERAZOL 7) 0.4 % vaginal cream  Screen for STD (sexually transmitted disease) - Plan: HIV Antibody (routine testing w rflx), RPR  Elevated blood sugar - Plan: Hemoglobin A1c  Recurrent/resistent yeast infection  P:   Pap :cotesting today/GC/CT  Labs:as above, pt desires testing for Diabetes because had elevated blood sugar with lab work with PCP. A1C drawn  Medications: continue YAZ

## 2020-09-05 NOTE — Telephone Encounter (Signed)
Pharmacist placed a note on Rx stating Rx should be sent to mail order . Rx sent to mail order.

## 2020-09-06 DIAGNOSIS — F331 Major depressive disorder, recurrent, moderate: Secondary | ICD-10-CM | POA: Diagnosis not present

## 2020-09-06 LAB — HEMOGLOBIN A1C
Hgb A1c MFr Bld: 5.3 % of total Hgb (ref <5.7)
Mean Plasma Glucose: 105 mg/dL
eAG (mmol/L): 5.8 mmol/L

## 2020-09-06 LAB — HIV ANTIBODY (ROUTINE TESTING W REFLEX): HIV 1&2 Ab, 4th Generation: NONREACTIVE

## 2020-09-06 LAB — RPR: RPR Ser Ql: NONREACTIVE

## 2020-09-07 LAB — CYTOLOGY - PAP
Chlamydia: NEGATIVE
Comment: NEGATIVE
Comment: NEGATIVE
Comment: NORMAL
Diagnosis: UNDETERMINED — AB
High risk HPV: NEGATIVE
Neisseria Gonorrhea: NEGATIVE

## 2020-09-09 ENCOUNTER — Encounter: Payer: Self-pay | Admitting: Nurse Practitioner

## 2020-09-09 ENCOUNTER — Telehealth: Payer: Self-pay | Admitting: *Deleted

## 2020-09-09 ENCOUNTER — Other Ambulatory Visit: Payer: Self-pay | Admitting: Nurse Practitioner

## 2020-09-09 DIAGNOSIS — B373 Candidiasis of vulva and vagina: Secondary | ICD-10-CM

## 2020-09-09 DIAGNOSIS — B3731 Acute candidiasis of vulva and vagina: Secondary | ICD-10-CM

## 2020-09-09 LAB — CANDIDIASIS, PCR
C. albicans, DNA: DETECTED — AB
C. glabrata, DNA: NOT DETECTED
C. parapsilosis, DNA: NOT DETECTED
C. tropicalis, DNA: NOT DETECTED

## 2020-09-09 MED ORDER — NYSTATIN-TRIAMCINOLONE 100000-0.1 UNIT/GM-% EX OINT: 1.0000 "application " | TOPICAL_OINTMENT | Freq: Two times a day (BID) | CUTANEOUS | 0 refills | Status: DC

## 2020-09-09 NOTE — Telephone Encounter (Signed)
Patient called and left a message about test results, however I saw patient did read my chart message. Patient asked about wet prep result and I explained this was done in office. I told her the yeast culture is still pending. Patient was seen and treated on 09/05/20 with Terazol 7 cream and reports she doesn't feel any better with vulvar irritation. Even report slight odor this am, she schedule appointment for 09/12/20 again to see you because she is not any better. Patient asked if you would be willing send something stronger or any other recommendations?

## 2020-09-09 NOTE — Progress Notes (Deleted)
GYNECOLOGY  VISIT  CC:   ***  HPI: 40 y.o. [id number] Married   White or Caucasian [place] female here for ***.     GYNECOLOGIC HISTORY: Patient's last menstrual period was 08/13/2020 (exact date). Contraception: *** Menopausal hormone therapy: ***  Patient Active Problem List   Diagnosis Date Noted  . Rosacea 03/05/2019  . Anxiety 03/05/2019    Past Medical History:  Diagnosis Date  . Anxiety   . Depression   . Rosacea     Past Surgical History:  Procedure Laterality Date  . AUGMENTATION MAMMAPLASTY     breast enhancement & removal    MEDS:   Current Outpatient Medications on File Prior to Visit  Medication Sig Dispense Refill  . Azelaic Acid (FINACEA) 15 % cream After skin is thoroughly washed and patted dry, gently but thoroughly massage a thin film of azelaic acid cream into the affected area twice daily, in the morning and evening. 30 g 3  . drospirenone-ethinyl estradiol (YAZ) 3-0.02 MG tablet Take 1 tablet by mouth daily. 84 tablet 4  . fluconazole (DIFLUCAN) 150 MG tablet Take 150 mg by mouth once.    . fluticasone (FLONASE) 50 MCG/ACT nasal spray Place into both nostrils daily.    . Multiple Vitamin (MULTIVITAMIN) tablet Take 1 tablet by mouth daily.    . Omega-3 Fatty Acids (FISH OIL) 1000 MG CAPS Take by mouth.    . RESTASIS 0.05 % ophthalmic emulsion     . sertraline (ZOLOFT) 25 MG tablet Take 25 mg by mouth daily.    Marland Kitchen terconazole (TERAZOL 7) 0.4 % vaginal cream Place 1 applicator vaginally at bedtime. 45 g 0   No current facility-administered medications on file prior to visit.    ALLERGIES: Patient has no known allergies.  Family History  Problem Relation Age of Onset  . Diabetes Mother   . Depression Father   . Anxiety disorder Father     SH:  ***  Review of Systems  PHYSICAL EXAMINATION:    LMP 08/13/2020 (Exact Date)     General appearance: alert, cooperative, no acute distress  CV:  {Exam; heart brief:31539} Lungs:  {pe lungs  ob:314451::"clear to auscultation, no wheezes, rales or rhonchi, symmetric air entry"} Breasts: {Exam; breast:13139::"normal appearance, no masses or tenderness"} Abdomen: soft, non-tender; bowel sounds normal; no masses,  no organomegaly Lymph:  no inguinal LAD noted  Pelvic: External genitalia:  no lesions              Urethra:  normal appearing urethra with no masses, tenderness or lesions              Bartholins and Skenes: normal                 Vagina: normal appearing vagina               Cervix: {CHL AMB PHY EX CERVIX NORM DEFAULT:[phone]::"no lesions"}              Bimanual Exam:  Uterus:  {CHL AMB PHY EX UTERUS NORM DEFAULT:[phone]::"normal size, contour, position, consistency, mobility, non-tender"}              Adnexa: {CHL AMB PHY EX ADNEXA NO MASS DEFAULT:[phone]::"no mass, fullness, tenderness"}               Chaperone, ***, CMA, was present for exam.  Assessment: ***  Plan: ***   {NUMBERS; -10-45 JOINT ROM:10287} minutes of total time was spent for this patient encounter, including preparation, face-to-face  counseling with the patient and coordination of care, and documentation of the encounter.

## 2020-09-12 ENCOUNTER — Ambulatory Visit: Payer: BC Managed Care – PPO | Admitting: Nurse Practitioner

## 2020-09-13 DIAGNOSIS — F331 Major depressive disorder, recurrent, moderate: Secondary | ICD-10-CM | POA: Diagnosis not present

## 2020-09-13 NOTE — Telephone Encounter (Signed)
Kelly sent my chart message with patient discuss the below, encounter will be closed.

## 2020-09-16 DIAGNOSIS — F341 Dysthymic disorder: Secondary | ICD-10-CM | POA: Diagnosis not present

## 2020-09-16 DIAGNOSIS — F411 Generalized anxiety disorder: Secondary | ICD-10-CM | POA: Diagnosis not present

## 2020-09-16 DIAGNOSIS — F3341 Major depressive disorder, recurrent, in partial remission: Secondary | ICD-10-CM | POA: Diagnosis not present

## 2020-09-20 DIAGNOSIS — F3341 Major depressive disorder, recurrent, in partial remission: Secondary | ICD-10-CM | POA: Diagnosis not present

## 2020-09-20 DIAGNOSIS — F341 Dysthymic disorder: Secondary | ICD-10-CM | POA: Diagnosis not present

## 2020-09-20 DIAGNOSIS — F411 Generalized anxiety disorder: Secondary | ICD-10-CM | POA: Diagnosis not present

## 2020-09-20 DIAGNOSIS — F331 Major depressive disorder, recurrent, moderate: Secondary | ICD-10-CM | POA: Diagnosis not present

## 2020-09-21 ENCOUNTER — Ambulatory Visit: Payer: BC Managed Care – PPO | Admitting: Internal Medicine

## 2020-09-27 DIAGNOSIS — F331 Major depressive disorder, recurrent, moderate: Secondary | ICD-10-CM | POA: Diagnosis not present

## 2020-10-11 DIAGNOSIS — F331 Major depressive disorder, recurrent, moderate: Secondary | ICD-10-CM | POA: Diagnosis not present

## 2020-10-12 DIAGNOSIS — M9903 Segmental and somatic dysfunction of lumbar region: Secondary | ICD-10-CM | POA: Diagnosis not present

## 2020-10-12 DIAGNOSIS — M62830 Muscle spasm of back: Secondary | ICD-10-CM | POA: Diagnosis not present

## 2020-10-12 DIAGNOSIS — M5416 Radiculopathy, lumbar region: Secondary | ICD-10-CM | POA: Diagnosis not present

## 2020-10-12 DIAGNOSIS — M9905 Segmental and somatic dysfunction of pelvic region: Secondary | ICD-10-CM | POA: Diagnosis not present

## 2020-10-13 ENCOUNTER — Other Ambulatory Visit: Payer: Self-pay

## 2020-10-14 ENCOUNTER — Ambulatory Visit: Payer: BC Managed Care – PPO | Admitting: Internal Medicine

## 2020-10-14 ENCOUNTER — Encounter: Payer: Self-pay | Admitting: Internal Medicine

## 2020-10-14 VITALS — BP 100/70 | HR 65 | Temp 98.4°F | Wt 115.4 lb

## 2020-10-14 DIAGNOSIS — N649 Disorder of breast, unspecified: Secondary | ICD-10-CM

## 2020-10-14 DIAGNOSIS — M5442 Lumbago with sciatica, left side: Secondary | ICD-10-CM

## 2020-10-14 DIAGNOSIS — N644 Mastodynia: Secondary | ICD-10-CM

## 2020-10-14 NOTE — Patient Instructions (Signed)
-  Nice seeing you today!!  -For your back: icing, as needed ibuprofen, back stretches daily. If no improvement, will consider PT referral.  -Mammogram will be requested.

## 2020-10-14 NOTE — Progress Notes (Signed)
Established Patient Office Visit     This visit occurred during the SARS-CoV-2 public health emergency.  Safety protocols were in place, including screening questions prior to the visit, additional usage of staff PPE, and extensive cleaning of exam room while observing appropriate contact time as indicated for disinfecting solutions.    CC/Reason for Visit: Discuss some acute concerns  HPI: Marie Hanson is a 40 y.o. female who is coming in today for the above mentioned reasons. Past Medical History is significant for: Generalized anxiety disorder on Zoloft, insomnia and rosacea. She has had chronic numbness of her left great toe for about a year. Over the past couple weeks she has noticed numbness of her bilateral buttocks but worse on the left side that radiates down the back and side of her thigh and down into her big toe. She wonders what she can do. In addition, she has had some left nipple pain and enlargement. She does not believe she can be pregnant as she is on birth control and her husband has had 2 vasectomies.   Past Medical/Surgical History: Past Medical History:  Diagnosis Date  . Anxiety   . Depression   . Rosacea     Past Surgical History:  Procedure Laterality Date  . AUGMENTATION MAMMAPLASTY     breast enhancement & removal    Social History:  reports that she has never smoked. She has never used smokeless tobacco. She reports previous alcohol use. She reports that she does not use drugs.  Allergies: No Known Allergies  Family History:  Family History  Problem Relation Age of Onset  . Diabetes Mother   . Depression Father   . Anxiety disorder Father      Current Outpatient Medications:  .  Azelaic Acid (FINACEA) 15 % cream, After skin is thoroughly washed and patted dry, gently but thoroughly massage a thin film of azelaic acid cream into the affected area twice daily, in the morning and evening., Disp: 30 g, Rfl: 3 .  drospirenone-ethinyl  estradiol (YAZ) 3-0.02 MG tablet, Take 1 tablet by mouth daily., Disp: 84 tablet, Rfl: 4 .  fluticasone (FLONASE) 50 MCG/ACT nasal spray, Place into both nostrils daily., Disp: , Rfl:  .  Multiple Vitamin (MULTIVITAMIN) tablet, Take 1 tablet by mouth daily., Disp: , Rfl:  .  Omega-3 Fatty Acids (FISH OIL) 1000 MG CAPS, Take by mouth., Disp: , Rfl:  .  RESTASIS 0.05 % ophthalmic emulsion, , Disp: , Rfl:  .  sertraline (ZOLOFT) 25 MG tablet, Take 25 mg by mouth daily., Disp: , Rfl:   Review of Systems:  Constitutional: Denies fever, chills, diaphoresis, appetite change and fatigue.  HEENT: Denies photophobia, eye pain, redness, hearing loss, ear pain, congestion, sore throat, rhinorrhea, sneezing, mouth sores, trouble swallowing, neck pain, neck stiffness and tinnitus.   Respiratory: Denies SOB, DOE, cough, chest tightness,  and wheezing.   Cardiovascular: Denies chest pain, palpitations and leg swelling.  Gastrointestinal: Denies nausea, vomiting, abdominal pain, diarrhea, constipation, blood in stool and abdominal distention.  Genitourinary: Denies dysuria, urgency, frequency, hematuria, flank pain and difficulty urinating.  Endocrine: Denies: hot or cold intolerance, sweats, changes in hair or nails, polyuria, polydipsia. Musculoskeletal: Denies myalgias, back pain, joint swelling, arthralgias and gait problem.  Skin: Denies pallor, rash and wound.  Neurological: Denies dizziness, seizures, syncope, weakness, light-headedness, numbness and headaches.  Hematological: Denies adenopathy. Easy bruising, personal or family bleeding history  Psychiatric/Behavioral: Denies suicidal ideation, mood changes, confusion, nervousness, sleep disturbance and  agitation    Physical Exam: Vitals:   10/14/20 0755  BP: 100/70  Pulse: 65  Temp: 98.4 F (36.9 C)  TempSrc: Oral  SpO2: 97%  Weight: 115 lb 6.4 oz (52.3 kg)    Body mass index is 20.28 kg/m.   Constitutional: NAD, calm,  comfortable Eyes: PERRL, lids and conjunctivae normal ENMT: Mucous membranes are moist.  Cardiovascular:No extremity edema. 2+ pedal pulses. Skin: Both nipples are enlarged and painful to touch but it is especially worse on the left side. Neurologic: Grossly intact and nonfocal Psychiatric: Normal judgment and insight. Alert and oriented x 3. Normal mood.    Impression and Plan:  Acute bilateral low back pain with left-sided sciatica -Have advised as needed icing, ibuprofen, back stretches that she has been provided today. -If no improvement can consider referral to physical therapy, if she fails conservative management will consider MRI for further anatomic evaluation.  Breast pain, left Lesion of left nipple -She will be sent for screening and diagnostic imaging of bilateral breast but with more focus on the left.   Patient Instructions  -Nice seeing you today!!  -For your back: icing, as needed ibuprofen, back stretches daily. If no improvement, will consider PT referral.  -Mammogram will be requested.     [name], MD [facility] at [facility]

## 2020-10-17 DIAGNOSIS — M9905 Segmental and somatic dysfunction of pelvic region: Secondary | ICD-10-CM | POA: Diagnosis not present

## 2020-10-17 DIAGNOSIS — M9903 Segmental and somatic dysfunction of lumbar region: Secondary | ICD-10-CM | POA: Diagnosis not present

## 2020-10-17 DIAGNOSIS — M5416 Radiculopathy, lumbar region: Secondary | ICD-10-CM | POA: Diagnosis not present

## 2020-10-17 DIAGNOSIS — M62830 Muscle spasm of back: Secondary | ICD-10-CM | POA: Diagnosis not present

## 2020-10-18 DIAGNOSIS — F331 Major depressive disorder, recurrent, moderate: Secondary | ICD-10-CM | POA: Diagnosis not present

## 2020-10-19 ENCOUNTER — Encounter: Payer: Self-pay | Admitting: Internal Medicine

## 2020-10-19 DIAGNOSIS — M5416 Radiculopathy, lumbar region: Secondary | ICD-10-CM | POA: Diagnosis not present

## 2020-10-19 DIAGNOSIS — M62830 Muscle spasm of back: Secondary | ICD-10-CM | POA: Diagnosis not present

## 2020-10-19 DIAGNOSIS — M9903 Segmental and somatic dysfunction of lumbar region: Secondary | ICD-10-CM | POA: Diagnosis not present

## 2020-10-19 DIAGNOSIS — M9905 Segmental and somatic dysfunction of pelvic region: Secondary | ICD-10-CM | POA: Diagnosis not present

## 2020-10-20 DIAGNOSIS — G518 Other disorders of facial nerve: Secondary | ICD-10-CM | POA: Diagnosis not present

## 2020-10-20 DIAGNOSIS — M791 Myalgia, unspecified site: Secondary | ICD-10-CM | POA: Diagnosis not present

## 2020-10-20 DIAGNOSIS — M542 Cervicalgia: Secondary | ICD-10-CM | POA: Diagnosis not present

## 2020-10-20 DIAGNOSIS — G44229 Chronic tension-type headache, not intractable: Secondary | ICD-10-CM | POA: Diagnosis not present

## 2020-10-25 DIAGNOSIS — F331 Major depressive disorder, recurrent, moderate: Secondary | ICD-10-CM | POA: Diagnosis not present

## 2020-10-26 DIAGNOSIS — M62830 Muscle spasm of back: Secondary | ICD-10-CM | POA: Diagnosis not present

## 2020-10-26 DIAGNOSIS — M9903 Segmental and somatic dysfunction of lumbar region: Secondary | ICD-10-CM | POA: Diagnosis not present

## 2020-10-26 DIAGNOSIS — M5416 Radiculopathy, lumbar region: Secondary | ICD-10-CM | POA: Diagnosis not present

## 2020-10-26 DIAGNOSIS — M9905 Segmental and somatic dysfunction of pelvic region: Secondary | ICD-10-CM | POA: Diagnosis not present

## 2020-11-01 DIAGNOSIS — F331 Major depressive disorder, recurrent, moderate: Secondary | ICD-10-CM | POA: Diagnosis not present

## 2020-11-02 DIAGNOSIS — M9905 Segmental and somatic dysfunction of pelvic region: Secondary | ICD-10-CM | POA: Diagnosis not present

## 2020-11-02 DIAGNOSIS — M62830 Muscle spasm of back: Secondary | ICD-10-CM | POA: Diagnosis not present

## 2020-11-02 DIAGNOSIS — M9903 Segmental and somatic dysfunction of lumbar region: Secondary | ICD-10-CM | POA: Diagnosis not present

## 2020-11-02 DIAGNOSIS — M5416 Radiculopathy, lumbar region: Secondary | ICD-10-CM | POA: Diagnosis not present

## 2020-11-07 ENCOUNTER — Ambulatory Visit
Admission: RE | Admit: 2020-11-07 | Discharge: 2020-11-07 | Disposition: A | Discharge status: Home / Self Care | Payer: BC Managed Care – PPO | Source: Ambulatory Visit | Attending: Internal Medicine | Admitting: Internal Medicine

## 2020-11-07 ENCOUNTER — Other Ambulatory Visit: Payer: Self-pay

## 2020-11-07 DIAGNOSIS — N644 Mastodynia: Secondary | ICD-10-CM

## 2020-11-07 DIAGNOSIS — N6489 Other specified disorders of breast: Secondary | ICD-10-CM | POA: Diagnosis not present

## 2020-11-07 DIAGNOSIS — R922 Inconclusive mammogram: Secondary | ICD-10-CM | POA: Diagnosis not present

## 2020-11-07 DIAGNOSIS — N649 Disorder of breast, unspecified: Secondary | ICD-10-CM

## 2020-11-07 HISTORY — DX: Mastodynia: N64.4

## 2020-11-15 DIAGNOSIS — F331 Major depressive disorder, recurrent, moderate: Secondary | ICD-10-CM | POA: Diagnosis not present

## 2020-11-16 DIAGNOSIS — M9903 Segmental and somatic dysfunction of lumbar region: Secondary | ICD-10-CM | POA: Diagnosis not present

## 2020-11-16 DIAGNOSIS — M62830 Muscle spasm of back: Secondary | ICD-10-CM | POA: Diagnosis not present

## 2020-11-16 DIAGNOSIS — M9905 Segmental and somatic dysfunction of pelvic region: Secondary | ICD-10-CM | POA: Diagnosis not present

## 2020-11-16 DIAGNOSIS — M5416 Radiculopathy, lumbar region: Secondary | ICD-10-CM | POA: Diagnosis not present

## 2020-11-22 DIAGNOSIS — F331 Major depressive disorder, recurrent, moderate: Secondary | ICD-10-CM | POA: Diagnosis not present

## 2020-12-01 ENCOUNTER — Other Ambulatory Visit: Payer: BC Managed Care – PPO

## 2020-12-06 DIAGNOSIS — F331 Major depressive disorder, recurrent, moderate: Secondary | ICD-10-CM | POA: Diagnosis not present

## 2020-12-20 DIAGNOSIS — F331 Major depressive disorder, recurrent, moderate: Secondary | ICD-10-CM | POA: Diagnosis not present

## 2020-12-29 DIAGNOSIS — M542 Cervicalgia: Secondary | ICD-10-CM | POA: Diagnosis not present

## 2020-12-29 DIAGNOSIS — G518 Other disorders of facial nerve: Secondary | ICD-10-CM | POA: Diagnosis not present

## 2020-12-29 DIAGNOSIS — M791 Myalgia, unspecified site: Secondary | ICD-10-CM | POA: Diagnosis not present

## 2020-12-29 DIAGNOSIS — G44229 Chronic tension-type headache, not intractable: Secondary | ICD-10-CM | POA: Diagnosis not present

## 2020-12-30 DIAGNOSIS — F3341 Major depressive disorder, recurrent, in partial remission: Secondary | ICD-10-CM | POA: Diagnosis not present

## 2020-12-30 DIAGNOSIS — F341 Dysthymic disorder: Secondary | ICD-10-CM | POA: Diagnosis not present

## 2020-12-30 DIAGNOSIS — F411 Generalized anxiety disorder: Secondary | ICD-10-CM | POA: Diagnosis not present

## 2021-01-02 DIAGNOSIS — M62830 Muscle spasm of back: Secondary | ICD-10-CM | POA: Diagnosis not present

## 2021-01-02 DIAGNOSIS — M5416 Radiculopathy, lumbar region: Secondary | ICD-10-CM | POA: Diagnosis not present

## 2021-01-02 DIAGNOSIS — M9903 Segmental and somatic dysfunction of lumbar region: Secondary | ICD-10-CM | POA: Diagnosis not present

## 2021-01-02 DIAGNOSIS — M9905 Segmental and somatic dysfunction of pelvic region: Secondary | ICD-10-CM | POA: Diagnosis not present

## 2021-01-06 DIAGNOSIS — F411 Generalized anxiety disorder: Secondary | ICD-10-CM | POA: Diagnosis not present

## 2021-01-16 DIAGNOSIS — F411 Generalized anxiety disorder: Secondary | ICD-10-CM | POA: Diagnosis not present

## 2021-01-19 DIAGNOSIS — F411 Generalized anxiety disorder: Secondary | ICD-10-CM | POA: Diagnosis not present

## 2021-01-19 DIAGNOSIS — F3341 Major depressive disorder, recurrent, in partial remission: Secondary | ICD-10-CM | POA: Diagnosis not present

## 2021-01-19 DIAGNOSIS — F3181 Bipolar II disorder: Secondary | ICD-10-CM | POA: Diagnosis not present

## 2021-01-19 DIAGNOSIS — F341 Dysthymic disorder: Secondary | ICD-10-CM | POA: Diagnosis not present

## 2021-01-21 ENCOUNTER — Other Ambulatory Visit: Payer: Self-pay | Admitting: Internal Medicine

## 2021-01-23 DIAGNOSIS — F411 Generalized anxiety disorder: Secondary | ICD-10-CM | POA: Diagnosis not present

## 2021-01-24 ENCOUNTER — Encounter: Payer: Self-pay | Admitting: Internal Medicine

## 2021-01-26 MED ORDER — AZELAIC ACID 15 % EX GEL: CUTANEOUS | 3 refills | Status: DC

## 2021-02-03 DIAGNOSIS — F411 Generalized anxiety disorder: Secondary | ICD-10-CM | POA: Diagnosis not present

## 2021-02-06 DIAGNOSIS — M5416 Radiculopathy, lumbar region: Secondary | ICD-10-CM | POA: Diagnosis not present

## 2021-02-06 DIAGNOSIS — M9903 Segmental and somatic dysfunction of lumbar region: Secondary | ICD-10-CM | POA: Diagnosis not present

## 2021-02-06 DIAGNOSIS — M9901 Segmental and somatic dysfunction of cervical region: Secondary | ICD-10-CM | POA: Diagnosis not present

## 2021-02-06 DIAGNOSIS — M542 Cervicalgia: Secondary | ICD-10-CM | POA: Diagnosis not present

## 2021-02-14 DIAGNOSIS — R41840 Attention and concentration deficit: Secondary | ICD-10-CM | POA: Diagnosis not present

## 2021-02-14 DIAGNOSIS — F411 Generalized anxiety disorder: Secondary | ICD-10-CM | POA: Diagnosis not present

## 2021-02-16 DIAGNOSIS — F3341 Major depressive disorder, recurrent, in partial remission: Secondary | ICD-10-CM | POA: Diagnosis not present

## 2021-02-16 DIAGNOSIS — F411 Generalized anxiety disorder: Secondary | ICD-10-CM | POA: Diagnosis not present

## 2021-02-16 DIAGNOSIS — F341 Dysthymic disorder: Secondary | ICD-10-CM | POA: Diagnosis not present

## 2021-02-16 DIAGNOSIS — F3181 Bipolar II disorder: Secondary | ICD-10-CM | POA: Diagnosis not present

## 2021-02-17 DIAGNOSIS — H04123 Dry eye syndrome of bilateral lacrimal glands: Secondary | ICD-10-CM | POA: Diagnosis not present

## 2021-02-17 DIAGNOSIS — H10413 Chronic giant papillary conjunctivitis, bilateral: Secondary | ICD-10-CM | POA: Diagnosis not present

## 2021-02-23 DIAGNOSIS — H04123 Dry eye syndrome of bilateral lacrimal glands: Secondary | ICD-10-CM | POA: Diagnosis not present

## 2021-02-23 DIAGNOSIS — H02054 Trichiasis without entropian left upper eyelid: Secondary | ICD-10-CM | POA: Diagnosis not present

## 2021-02-23 DIAGNOSIS — H10413 Chronic giant papillary conjunctivitis, bilateral: Secondary | ICD-10-CM | POA: Diagnosis not present

## 2021-03-13 DIAGNOSIS — F411 Generalized anxiety disorder: Secondary | ICD-10-CM | POA: Diagnosis not present

## 2021-03-27 DIAGNOSIS — F411 Generalized anxiety disorder: Secondary | ICD-10-CM | POA: Diagnosis not present

## 2021-03-29 DIAGNOSIS — F341 Dysthymic disorder: Secondary | ICD-10-CM | POA: Diagnosis not present

## 2021-03-29 DIAGNOSIS — F411 Generalized anxiety disorder: Secondary | ICD-10-CM | POA: Diagnosis not present

## 2021-03-29 DIAGNOSIS — R41840 Attention and concentration deficit: Secondary | ICD-10-CM | POA: Diagnosis not present

## 2021-03-29 DIAGNOSIS — F3341 Major depressive disorder, recurrent, in partial remission: Secondary | ICD-10-CM | POA: Diagnosis not present

## 2021-04-12 DIAGNOSIS — F411 Generalized anxiety disorder: Secondary | ICD-10-CM | POA: Diagnosis not present

## 2021-04-26 DIAGNOSIS — F331 Major depressive disorder, recurrent, moderate: Secondary | ICD-10-CM | POA: Diagnosis not present

## 2021-05-01 DIAGNOSIS — M542 Cervicalgia: Secondary | ICD-10-CM | POA: Diagnosis not present

## 2021-05-01 DIAGNOSIS — M791 Myalgia, unspecified site: Secondary | ICD-10-CM | POA: Diagnosis not present

## 2021-05-01 DIAGNOSIS — G518 Other disorders of facial nerve: Secondary | ICD-10-CM | POA: Diagnosis not present

## 2021-05-01 DIAGNOSIS — G44229 Chronic tension-type headache, not intractable: Secondary | ICD-10-CM | POA: Diagnosis not present

## 2021-05-09 DIAGNOSIS — F331 Major depressive disorder, recurrent, moderate: Secondary | ICD-10-CM | POA: Diagnosis not present

## 2021-05-11 DIAGNOSIS — L578 Other skin changes due to chronic exposure to nonionizing radiation: Secondary | ICD-10-CM | POA: Diagnosis not present

## 2021-05-11 DIAGNOSIS — L718 Other rosacea: Secondary | ICD-10-CM | POA: Diagnosis not present

## 2021-05-26 ENCOUNTER — Encounter: Payer: BC Managed Care – PPO | Admitting: Internal Medicine

## 2021-05-29 DIAGNOSIS — F411 Generalized anxiety disorder: Secondary | ICD-10-CM | POA: Diagnosis not present

## 2021-05-29 DIAGNOSIS — R41840 Attention and concentration deficit: Secondary | ICD-10-CM | POA: Diagnosis not present

## 2021-05-29 DIAGNOSIS — F3341 Major depressive disorder, recurrent, in partial remission: Secondary | ICD-10-CM | POA: Diagnosis not present

## 2021-05-29 DIAGNOSIS — F341 Dysthymic disorder: Secondary | ICD-10-CM | POA: Diagnosis not present

## 2021-06-07 ENCOUNTER — Encounter: Payer: Self-pay | Admitting: Internal Medicine

## 2021-06-07 ENCOUNTER — Ambulatory Visit (INDEPENDENT_AMBULATORY_CARE_PROVIDER_SITE_OTHER): Payer: BC Managed Care – PPO | Admitting: Internal Medicine

## 2021-06-07 ENCOUNTER — Other Ambulatory Visit: Payer: Self-pay

## 2021-06-07 VITALS — BP 104/70 | HR 60 | Temp 97.8°F | Ht 63.0 in | Wt 112.2 lb

## 2021-06-07 DIAGNOSIS — F909 Attention-deficit hyperactivity disorder, unspecified type: Secondary | ICD-10-CM | POA: Diagnosis not present

## 2021-06-07 DIAGNOSIS — F331 Major depressive disorder, recurrent, moderate: Secondary | ICD-10-CM | POA: Diagnosis not present

## 2021-06-07 DIAGNOSIS — L719 Rosacea, unspecified: Secondary | ICD-10-CM | POA: Diagnosis not present

## 2021-06-07 DIAGNOSIS — Z3041 Encounter for surveillance of contraceptive pills: Secondary | ICD-10-CM

## 2021-06-07 DIAGNOSIS — Z0000 Encounter for general adult medical examination without abnormal findings: Secondary | ICD-10-CM

## 2021-06-07 LAB — CBC WITH DIFFERENTIAL/PLATELET
Basophils Absolute: 0 10*3/uL (ref 0.0–0.1)
Basophils Relative: 0.8 % (ref 0.0–3.0)
Eosinophils Absolute: 0 10*3/uL (ref 0.0–0.7)
Eosinophils Relative: 0.9 % (ref 0.0–5.0)
HCT: 42.2 % (ref 36.0–46.0)
Hemoglobin: 13.8 g/dL (ref 12.0–15.0)
Lymphocytes Relative: 38.7 % (ref 12.0–46.0)
Lymphs Abs: 2 10*3/uL (ref 0.7–4.0)
MCHC: 32.8 g/dL (ref 30.0–36.0)
MCV: 91.4 fl (ref 78.0–100.0)
Monocytes Absolute: 0.2 10*3/uL (ref 0.1–1.0)
Monocytes Relative: 4.4 % (ref 3.0–12.0)
Neutro Abs: 2.8 10*3/uL (ref 1.4–7.7)
Neutrophils Relative %: 55.2 % (ref 43.0–77.0)
Platelets: 193 10*3/uL (ref 150.0–400.0)
RBC: 4.61 Mil/uL (ref 3.87–5.11)
RDW: 13.8 % (ref 11.5–15.5)
WBC: 5.1 10*3/uL (ref 4.0–10.5)

## 2021-06-07 LAB — LIPID PANEL
Cholesterol: 157 mg/dL (ref 0–200)
HDL: 92.3 mg/dL (ref >39.00)
LDL Cholesterol: 54 mg/dL (ref 0–99)
NonHDL: 64.91
Total CHOL/HDL Ratio: 2
Triglycerides: 54 mg/dL (ref 0.0–149.0)
VLDL: 10.8 mg/dL (ref 0.0–40.0)

## 2021-06-07 LAB — COMPREHENSIVE METABOLIC PANEL
ALT: 22 U/L (ref 0–35)
AST: 19 U/L (ref 0–37)
Albumin: 4.4 g/dL (ref 3.5–5.2)
Alkaline Phosphatase: 46 U/L (ref 39–117)
BUN: 16 mg/dL (ref 6–23)
CO2: 27 mEq/L (ref 19–32)
Calcium: 9.1 mg/dL (ref 8.4–10.5)
Chloride: 102 mEq/L (ref 96–112)
Creatinine, Ser: 1.04 mg/dL (ref 0.40–1.20)
GFR: 67.83 mL/min (ref >60.00)
Glucose, Bld: 99 mg/dL (ref 70–99)
Potassium: 4.4 mEq/L (ref 3.5–5.1)
Sodium: 136 mEq/L (ref 135–145)
Total Bilirubin: 0.5 mg/dL (ref 0.2–1.2)
Total Protein: 7.2 g/dL (ref 6.0–8.3)

## 2021-06-07 LAB — VITAMIN B12: Vitamin B-12: 300 pg/mL (ref 211–911)

## 2021-06-07 LAB — VITAMIN D 25 HYDROXY (VIT D DEFICIENCY, FRACTURES): VITD: 82.04 ng/mL (ref 30.00–100.00)

## 2021-06-07 LAB — TSH: TSH: 1.26 u[IU]/mL (ref 0.35–5.50)

## 2021-06-07 MED ORDER — AZELAIC ACID 15 % EX GEL: CUTANEOUS | 3 refills | Status: DC

## 2021-06-07 MED ORDER — DROSPIRENONE-ETHINYL ESTRADIOL 3-0.02 MG PO TABS: 1.0000 | ORAL_TABLET | Freq: Every day | ORAL | 4 refills | Status: DC

## 2021-06-07 NOTE — Progress Notes (Signed)
Established Patient Office Visit     This visit occurred during the SARS-CoV-2 public health emergency.  Safety protocols were in place, including screening questions prior to the visit, additional usage of staff PPE, and extensive cleaning of exam room while observing appropriate contact time as indicated for disinfecting solutions.    CC/Reason for Visit: Annual preventive exam  HPI: Marie Hanson is a 40 y.o. female who is coming in today for the above mentioned reasons. Past Medical History is significant for: Generalized anxiety disorder, rosacea, recent diagnosis of ADHD by psychiatry now on Adzenys (amphetamine).  She has routine eye and dental care.  She feels overall well.  She had a flu vaccine in September, she declines COVID booster.  She had a mammogram in spring 2022, she had a Pap smear in 2021.   Past Medical/Surgical History: Past Medical History:  Diagnosis Date   Anxiety    Depression    Nipple pain x's 2 months   Rosacea     Past Surgical History:  Procedure Laterality Date   AUGMENTATION MAMMAPLASTY     breast enhancement & removal    Social History:  reports that she has never smoked. She has never used smokeless tobacco. She reports that she does not currently use alcohol. She reports that she does not use drugs.  Allergies: No Known Allergies  Family History:  Family History  Problem Relation Age of Onset   Diabetes Mother    Depression Father    Anxiety disorder Father      Current Outpatient Medications:    ADZENYS XR-ODT 6.3 MG TBED, Take 1 tablet by mouth daily., Disp: , Rfl:    fluticasone (FLONASE) 50 MCG/ACT nasal spray, Place into both nostrils daily., Disp: , Rfl:    Multiple Vitamin (MULTIVITAMIN) tablet, Take 1 tablet by mouth daily., Disp: , Rfl:    Omega-3 Fatty Acids (FISH OIL) 1000 MG CAPS, Take by mouth., Disp: , Rfl:    RESTASIS 0.05 % ophthalmic emulsion, , Disp: , Rfl:    Azelaic Acid (FINACEA) 15 % gel, After  skin is thoroughly washed and patted dry, gently but thoroughly massage a thin film of azelaic acid cream into the affected area twice daily, in the morning and evening., Disp: 30 g, Rfl: 3   drospirenone-ethinyl estradiol (YAZ) 3-0.02 MG tablet, Take 1 tablet by mouth daily., Disp: 84 tablet, Rfl: 4  Review of Systems:  Constitutional: Denies fever, chills, diaphoresis, appetite change and fatigue.  HEENT: Denies photophobia, eye pain, redness, hearing loss, ear pain, congestion, sore throat, rhinorrhea, sneezing, mouth sores, trouble swallowing, neck pain, neck stiffness and tinnitus.   Respiratory: Denies SOB, DOE, cough, chest tightness,  and wheezing.   Cardiovascular: Denies chest pain, palpitations and leg swelling.  Gastrointestinal: Denies nausea, vomiting, abdominal pain, diarrhea, constipation, blood in stool and abdominal distention.  Genitourinary: Denies dysuria, urgency, frequency, hematuria, flank pain and difficulty urinating.  Endocrine: Denies: hot or cold intolerance, sweats, changes in hair or nails, polyuria, polydipsia. Musculoskeletal: Denies myalgias, back pain, joint swelling, arthralgias and gait problem.  Skin: Denies pallor, rash and wound.  Neurological: Denies dizziness, seizures, syncope, weakness, light-headedness, numbness and headaches.  Hematological: Denies adenopathy. Easy bruising, personal or family bleeding history  Psychiatric/Behavioral: Denies suicidal ideation, mood changes, confusion, nervousness, sleep disturbance and agitation    Physical Exam: Vitals:   06/07/21 0700  BP: 104/70  Pulse: 60  Temp: 97.8 F (36.6 C)  TempSrc: Oral  SpO2: 99%  Weight:  112 lb 3.2 oz (50.9 kg)  Height: 5\' 3"  (1.6 m)    Body mass index is 19.88 kg/m.   Constitutional: NAD, calm, comfortable Eyes: PERRL, lids and conjunctivae normal ENMT: Mucous membranes are moist. Posterior pharynx clear of any exudate or lesions. Normal dentition. Tympanic membrane is  pearly white, no erythema or bulging. Neck: normal, supple, no masses, no thyromegaly Respiratory: clear to auscultation bilaterally, no wheezing, no crackles. Normal respiratory effort. No accessory muscle use.  Cardiovascular: Regular rate and rhythm, no murmurs / rubs / gallops. No extremity edema. 2+ pedal pulses. No carotid bruits.  Abdomen: no tenderness, no masses palpated. No hepatosplenomegaly. Bowel sounds positive.  Musculoskeletal: no clubbing / cyanosis. No joint deformity upper and lower extremities. Good ROM, no contractures. Normal muscle tone.  Skin: no rashes, lesions, ulcers. No induration Neurologic: CN 2-12 grossly intact. Sensation intact, DTR normal. Strength 5/5 in all 4.  Psychiatric: Normal judgment and insight. Alert and oriented x 3. Normal mood.    Impression and Plan:  Encounter for preventive health examination  - Plan: CBC with Differential/Platelet, Comprehensive metabolic panel, Lipid panel, TSH, Vitamin B12, VITAMIN D 25 Hydroxy (Vit-D Deficiency, Fractures) -Recommend routine eye and dental care. -Immunizations: All immunizations are up-to-date with the exception of COVID booster which she declines despite counseling -Healthy lifestyle discussed in detail. -Labs to be updated today. -Colon cancer screening: Commence age 70 -Breast cancer screening: Had mammogram in March 2022 -Cervical cancer screening: 2021 by GYN -Lung cancer screening: Not applicable -Prostate cancer screening: Not applicable -DEXA: Not applicable   Surveillance of previously prescribed contraceptive pill  - Plan: drospirenone-ethinyl estradiol (YAZ) 3-0.02 MG tablet  Attention deficit hyperactivity disorder (ADHD), unspecified ADHD type -Managed by psychiatry.  Rosacea -On azaleic acid.   Patient Instructions  -Nice seeing you today!!  -Lab work today; will notify you once results are available.  -Schedule follow up in 1 year or sooner as needed.   08-30-1996, MD [facility] at [facility]

## 2021-06-07 NOTE — Addendum Note (Signed)
Addended by: Kandra Nicolas on: 06/07/2021 07:41 AM   Modules accepted: Orders

## 2021-06-07 NOTE — Patient Instructions (Signed)
-  Nice seeing you today!!  -Lab work today; will notify you once results are available.  -Schedule follow up in 1 year or sooner as needed. 

## 2021-06-29 ENCOUNTER — Encounter: Payer: Self-pay | Admitting: Internal Medicine

## 2021-06-29 ENCOUNTER — Telehealth: Payer: BC Managed Care – PPO | Admitting: Internal Medicine

## 2021-06-29 VITALS — Wt 115.0 lb

## 2021-06-29 DIAGNOSIS — J302 Other seasonal allergic rhinitis: Secondary | ICD-10-CM | POA: Diagnosis not present

## 2021-06-29 MED ORDER — MONTELUKAST SODIUM 10 MG PO TABS: 10.0000 mg | ORAL_TABLET | Freq: Every day | ORAL | 1 refills | Status: DC

## 2021-06-29 NOTE — Progress Notes (Signed)
Virtual Visit via Video Note  I connected with Marie Hanson on 06/29/21 at  9:30 AM EST by a video enabled telemedicine application and verified that I am speaking with the correct person using two identifiers.  Location patient: home Location provider: work office Persons participating in the virtual visit: patient, provider  I discussed the limitations of evaluation and management by telemedicine and the availability of in person appointments. The patient expressed understanding and agreed to proceed.   HPI: She has scheduled this visit to discuss her seasonal allergies.  The fall has been particularly difficult on her.  Her allergy symptoms increase when she goes outside.  She gets a headache, runny nose, feels like her nose is congested particularly the right side with a burning sensation.  She uses an antihistamine daily, Flonase daily, has also tried saline nasal spray and nasal gel without improvement.  She is scheduled to see an allergist in December.   ROS: Constitutional: Denies fever, chills, diaphoresis, appetite change and fatigue.  HEENT: Denies photophobia, eye pain, redness, , mouth sores, trouble swallowing, neck pain, neck stiffness and tinnitus.   Respiratory: Denies  DOE, cough, chest tightness,  and wheezing.   Cardiovascular: Denies chest pain, palpitations and leg swelling.  Gastrointestinal: Denies nausea, vomiting, abdominal pain, diarrhea, constipation, blood in stool and abdominal distention.  Genitourinary: Denies dysuria, urgency, frequency, hematuria, flank pain and difficulty urinating.  Endocrine: Denies: hot or cold intolerance, sweats, changes in hair or nails, polyuria, polydipsia. Musculoskeletal: Denies myalgias, back pain, joint swelling, arthralgias and gait problem.  Skin: Denies pallor, rash and wound.  Neurological: Denies dizziness, seizures, syncope, weakness, light-headedness, numbness and headaches.  Hematological: Denies  adenopathy. Easy bruising, personal or family bleeding history  Psychiatric/Behavioral: Denies suicidal ideation, mood changes, confusion, nervousness, sleep disturbance and agitation   Past Medical History:  Diagnosis Date   Anxiety    Depression    Nipple pain x's 2 months   Rosacea     Past Surgical History:  Procedure Laterality Date   AUGMENTATION MAMMAPLASTY     breast enhancement & removal    Family History  Problem Relation Age of Onset   Diabetes Mother    Depression Father    Anxiety disorder Father     SOCIAL HX:   reports that she has never smoked. She has never used smokeless tobacco. She reports that she does not currently use alcohol. She reports that she does not use drugs.   Current Outpatient Medications:    ADZENYS XR-ODT 6.3 MG TBED, Take 1 tablet by mouth daily., Disp: , Rfl:    Azelaic Acid (FINACEA) 15 % gel, After skin is thoroughly washed and patted dry, gently but thoroughly massage a thin film of azelaic acid cream into the affected area twice daily, in the morning and evening., Disp: 30 g, Rfl: 3   drospirenone-ethinyl estradiol (YAZ) 3-0.02 MG tablet, Take 1 tablet by mouth daily., Disp: 84 tablet, Rfl: 4   fluticasone (FLONASE) 50 MCG/ACT nasal spray, Place into both nostrils daily., Disp: , Rfl:    montelukast (SINGULAIR) 10 MG tablet, Take 1 tablet (10 mg total) by mouth at bedtime., Disp: 90 tablet, Rfl: 1   Multiple Vitamin (MULTIVITAMIN) tablet, Take 1 tablet by mouth daily., Disp: , Rfl:    Omega-3 Fatty Acids (FISH OIL) 1000 MG CAPS, Take by mouth., Disp: , Rfl:    RESTASIS 0.05 % ophthalmic emulsion, , Disp: , Rfl:   EXAM:   VITALS per patient if  applicable: None reported  GENERAL: alert, oriented, appears well and in no acute distress  HEENT: atraumatic, conjunttiva clear, no obvious abnormalities on inspection of external nose and ears  NECK: normal movements of the head and neck  LUNGS: on inspection no signs of respiratory  distress, breathing rate appears normal, no obvious gross increased work of breathing, gasping or wheezing  CV: no obvious cyanosis  MS: moves all visible extremities without noticeable abnormality  PSYCH/NEURO: pleasant and cooperative, no obvious depression or anxiety, speech and thought processing grossly intact  ASSESSMENT AND PLAN:   Seasonal allergies  - Plan: montelukast (SINGULAIR) 10 MG tablet -In addition to continued use of daily antihistamines and Flonase, have advised she add twice daily guaifenesin and will prescribe some Singulair given the severity of her symptoms. -She is scheduled to see allergist in December.     I discussed the assessment and treatment plan with the patient. The patient was provided an opportunity to ask questions and all were answered. The patient agreed with the plan and demonstrated an understanding of the instructions.   The patient was advised to call back or seek an in-person evaluation if the symptoms worsen or if the condition fails to improve as anticipated.    Marie Jan, MD  [facility] at [facility]

## 2021-07-03 DIAGNOSIS — J3489 Other specified disorders of nose and nasal sinuses: Secondary | ICD-10-CM | POA: Diagnosis not present

## 2021-07-03 DIAGNOSIS — J3089 Other allergic rhinitis: Secondary | ICD-10-CM | POA: Diagnosis not present

## 2021-07-03 DIAGNOSIS — J45998 Other asthma: Secondary | ICD-10-CM | POA: Diagnosis not present

## 2021-07-03 DIAGNOSIS — J302 Other seasonal allergic rhinitis: Secondary | ICD-10-CM | POA: Diagnosis not present

## 2021-07-11 DIAGNOSIS — J3489 Other specified disorders of nose and nasal sinuses: Secondary | ICD-10-CM | POA: Diagnosis not present

## 2021-07-11 DIAGNOSIS — J45998 Other asthma: Secondary | ICD-10-CM | POA: Diagnosis not present

## 2021-07-11 DIAGNOSIS — J3089 Other allergic rhinitis: Secondary | ICD-10-CM | POA: Diagnosis not present

## 2021-07-11 DIAGNOSIS — J302 Other seasonal allergic rhinitis: Secondary | ICD-10-CM | POA: Diagnosis not present

## 2021-07-14 DIAGNOSIS — H16223 Keratoconjunctivitis sicca, not specified as Sjogren's, bilateral: Secondary | ICD-10-CM | POA: Diagnosis not present

## 2021-07-14 DIAGNOSIS — H1045 Other chronic allergic conjunctivitis: Secondary | ICD-10-CM | POA: Diagnosis not present

## 2021-07-20 ENCOUNTER — Encounter: Payer: Self-pay | Admitting: Internal Medicine

## 2021-07-20 ENCOUNTER — Ambulatory Visit: Payer: BC Managed Care – PPO | Admitting: Internal Medicine

## 2021-07-20 VITALS — BP 120/72 | HR 76 | Temp 98.7°F | Ht 63.0 in | Wt 112.2 lb

## 2021-07-20 DIAGNOSIS — M5442 Lumbago with sciatica, left side: Secondary | ICD-10-CM | POA: Diagnosis not present

## 2021-07-20 MED ORDER — PREDNISONE 10 MG (21) PO TBPK: ORAL_TABLET | ORAL | 0 refills | Status: DC

## 2021-07-20 MED ORDER — CYCLOBENZAPRINE HCL 5 MG PO TABS: 5.0000 mg | ORAL_TABLET | Freq: Every evening | ORAL | 1 refills | Status: DC | PRN

## 2021-07-20 NOTE — Patient Instructions (Signed)
-  Nice seeing you today!!  -Take prednisone as directed for 6 days.  -Take flexeril at bedtime as needed for pain/spasms.

## 2021-07-20 NOTE — Progress Notes (Signed)
Established Patient Office Visit     This visit occurred during the SARS-CoV-2 public health emergency.  Safety protocols were in place, including screening questions prior to the visit, additional usage of staff PPE, and extensive cleaning of exam room while observing appropriate contact time as indicated for disinfecting solutions.    CC/Reason for Visit: Low back pain with sciatica  HPI: Marie Hanson is a 40 y.o. female who is coming in today for the above mentioned reasons.  She is having significant left lower back pain with sciatica.  She is having numbness of her left buttock area that radiates down the back of her thigh with some numbness and tingling.  Past Medical/Surgical History: Past Medical History:  Diagnosis Date   Anxiety    Depression    Nipple pain x's 2 months   Rosacea     Past Surgical History:  Procedure Laterality Date   AUGMENTATION MAMMAPLASTY     breast enhancement & removal    Social History:  reports that she has never smoked. She has never used smokeless tobacco. She reports that she does not currently use alcohol. She reports that she does not use drugs.  Allergies: No Known Allergies  Family History:  Family History  Problem Relation Age of Onset   Diabetes Mother    Depression Father    Anxiety disorder Father      Current Outpatient Medications:    ADZENYS XR-ODT 6.3 MG TBED, Take 1 tablet by mouth daily., Disp: , Rfl:    Azelaic Acid (FINACEA) 15 % gel, After skin is thoroughly washed and patted dry, gently but thoroughly massage a thin film of azelaic acid cream into the affected area twice daily, in the morning and evening., Disp: 30 g, Rfl: 3   cyclobenzaprine (FLEXERIL) 5 MG tablet, Take 1 tablet (5 mg total) by mouth at bedtime as needed for muscle spasms., Disp: 30 tablet, Rfl: 1   drospirenone-ethinyl estradiol (YAZ) 3-0.02 MG tablet, Take 1 tablet by mouth daily., Disp: 84 tablet, Rfl: 4   fluticasone (FLONASE)  50 MCG/ACT nasal spray, Place into both nostrils daily., Disp: , Rfl:    montelukast (SINGULAIR) 10 MG tablet, Take 1 tablet (10 mg total) by mouth at bedtime., Disp: 90 tablet, Rfl: 1   Multiple Vitamin (MULTIVITAMIN) tablet, Take 1 tablet by mouth daily., Disp: , Rfl:    Omega-3 Fatty Acids (FISH OIL) 1000 MG CAPS, Take by mouth., Disp: , Rfl:    predniSONE (STERAPRED UNI-PAK 21 TAB) 10 MG (21) TBPK tablet, Take as directed, Disp: 21 tablet, Rfl: 0   RESTASIS 0.05 % ophthalmic emulsion, , Disp: , Rfl:   Review of Systems:  Constitutional: Denies fever, chills, diaphoresis, appetite change and fatigue.  HEENT: Denies photophobia, eye pain, redness, hearing loss, ear pain, congestion, sore throat, rhinorrhea, sneezing, mouth sores, trouble swallowing, neck pain, neck stiffness and tinnitus.   Respiratory: Denies SOB, DOE, cough, chest tightness,  and wheezing.   Cardiovascular: Denies chest pain, palpitations and leg swelling.  Gastrointestinal: Denies nausea, vomiting, abdominal pain, diarrhea, constipation, blood in stool and abdominal distention.  Genitourinary: Denies dysuria, urgency, frequency, hematuria, flank pain and difficulty urinating.  Endocrine: Denies: hot or cold intolerance, sweats, changes in hair or nails, polyuria, polydipsia. Musculoskeletal: Denies myalgias,  joint swelling, arthralgias and gait problem.  Skin: Denies pallor, rash and wound.  Neurological: Denies dizziness, seizures, syncope, weakness, light-headedness and headaches.  Hematological: Denies adenopathy. Easy bruising, personal or family bleeding history  Psychiatric/Behavioral: Denies suicidal ideation, mood changes, confusion, nervousness, sleep disturbance and agitation    Physical Exam: Vitals:   07/20/21 1521  BP: 120/72  Pulse: 76  Temp: 98.7 F (37.1 C)  TempSrc: Oral  SpO2: 97%  Weight: 112 lb 3.2 oz (50.9 kg)  Height: 5\' 3"  (1.6 m)    Body mass index is 19.88 kg/m.   Constitutional:  NAD, calm, comfortable Eyes: PERRL, lids and conjunctivae normal ENMT: Mucous membranes are moist.  Psychiatric: Normal judgment and insight. Alert and oriented x 3. Normal mood.    Impression and Plan:  Acute left-sided low back pain with left-sided sciatica  - Plan: predniSONE (STERAPRED UNI-PAK 21 TAB) 10 MG (21) TBPK tablet, cyclobenzaprine (FLEXERIL) 5 MG tablet -No red flag symptoms, do not believe imaging is necessary at this time.  Has been provided with back exercises, she will try massage therapy, ice therapy, prednisone taper as well as muscle relaxers as needed.  Time spent: 21 minutes reviewing chart, interviewing and examining patient and formulating plan of care.   Patient Instructions  -Nice seeing you today!!  -Take prednisone as directed for 6 days.  -Take flexeril at bedtime as needed for pain/spasms.    , MD [facility]

## 2021-07-25 ENCOUNTER — Ambulatory Visit: Payer: BC Managed Care – PPO | Admitting: Internal Medicine

## 2021-08-02 ENCOUNTER — Ambulatory Visit: Payer: BC Managed Care – PPO | Admitting: Allergy

## 2021-08-04 DIAGNOSIS — F331 Major depressive disorder, recurrent, moderate: Secondary | ICD-10-CM | POA: Diagnosis not present

## 2021-08-25 ENCOUNTER — Telehealth: Payer: BC Managed Care – PPO | Admitting: Family

## 2021-08-25 ENCOUNTER — Telehealth: Payer: BC Managed Care – PPO

## 2021-08-25 DIAGNOSIS — B3731 Acute candidiasis of vulva and vagina: Secondary | ICD-10-CM

## 2021-08-25 MED ORDER — TERCONAZOLE 0.4 % VA CREA: 1.0000 | TOPICAL_CREAM | Freq: Every day | VAGINAL | 0 refills | Status: DC

## 2021-08-25 MED ORDER — FLUCONAZOLE 150 MG PO TABS: 150.0000 mg | ORAL_TABLET | ORAL | 0 refills | Status: DC | PRN

## 2021-08-25 NOTE — Progress Notes (Signed)
Virtual Visit Consent   Marie Hanson, you are scheduled for a virtual visit with a [facility] provider today.     Just as with appointments in the office, your consent must be obtained to participate.  Your consent will be active for this visit and any virtual visit you may have with one of our providers in the next 365 days.     If you have a MyChart account, a copy of this consent can be sent to you electronically.  All virtual visits are billed to your insurance company just like a traditional visit in the office.    As this is a virtual visit, video technology does not allow for your provider to perform a traditional examination.  This may limit your provider's ability to fully assess your condition.  If your provider identifies any concerns that need to be evaluated in person or the need to arrange testing (such as labs, EKG, etc.), we will make arrangements to do so.     Although advances in technology are sophisticated, we cannot ensure that it will always work on either your end or our end.  If the connection with a video visit is poor, the visit may have to be switched to a telephone visit.  With either a video or telephone visit, we are not always able to ensure that we have a secure connection.     I need to obtain your verbal consent now.   Are you willing to proceed with your visit today?    Marie Hanson has provided verbal consent on 08/25/2021 for a virtual visit (video or telephone).   Evelina Dun, FNP   Date: 08/25/2021 10:01 AM   Virtual Visit via Video Note   I, Evelina Dun, connected with  Marie Hanson  ([id number], December 02, 1980) on 08/25/21 at 10:00 AM EST by a video-enabled telemedicine application and verified that I am speaking with the correct person using two identifiers.  Location: Patient: Virtual Visit Location Patient: Home Provider: Virtual Visit Location Provider: Home Office   I discussed the limitations of evaluation and  management by telemedicine and the availability of in person appointments. The patient expressed understanding and agreed to proceed.    History of Present Illness: Marie Hanson is a 41 y.o. who identifies as a female who was assigned female at birth, and is being seen today for yeast infection.  HPI: Vaginal Itching The patient's primary symptoms include genital itching and vaginal discharge. The patient's pertinent negatives include no genital odor, genital rash or vaginal bleeding. This is a new problem. The current episode started 1 to 4 weeks ago. The pain is mild. The vaginal discharge was white. There has been no bleeding. Nothing aggravates the symptoms.   Problems:  Patient Active Problem List   Diagnosis Date Noted   Rosacea 03/05/2019   Anxiety 03/05/2019    Allergies: No Known Allergies Medications:  Current Outpatient Medications:    ADZENYS XR-ODT 6.3 MG TBED, Take 1 tablet by mouth daily., Disp: , Rfl:    Azelaic Acid (FINACEA) 15 % gel, After skin is thoroughly washed and patted dry, gently but thoroughly massage a thin film of azelaic acid cream into the affected area twice daily, in the morning and evening., Disp: 30 g, Rfl: 3   drospirenone-ethinyl estradiol (YAZ) 3-0.02 MG tablet, Take 1 tablet by mouth daily., Disp: 84 tablet, Rfl: 4   fluconazole (DIFLUCAN) 150 MG tablet, Take 1 tablet (150 mg total) by mouth every three (  3) days as needed., Disp: 2 tablet, Rfl: 0   fluticasone (FLONASE) 50 MCG/ACT nasal spray, Place into both nostrils daily., Disp: , Rfl:    Multiple Vitamin (MULTIVITAMIN) tablet, Take 1 tablet by mouth daily., Disp: , Rfl:    Omega-3 Fatty Acids (FISH OIL) 1000 MG CAPS, Take by mouth., Disp: , Rfl:    RESTASIS 0.05 % ophthalmic emulsion, , Disp: , Rfl:    terconazole (TERAZOL 7) 0.4 % vaginal cream, Place 1 applicator vaginally at bedtime., Disp: 45 g, Rfl: 0  Observations/Objective: Patient is well-developed, well-nourished in no acute  distress.  Resting comfortably  at home.  Head is normocephalic, atraumatic.  No labored breathing. Speech is clear and coherent with logical content.  Patient is alert and oriented at baseline.   Assessment and Plan: 1. Vagina, candidiasis - fluconazole (DIFLUCAN) 150 MG tablet; Take 1 tablet (150 mg total) by mouth every three (3) days as needed.  Dispense: 2 tablet; Refill: 0  Keep clean and dry Avoid scratching Daily yogurt Follow up if symptoms worsen or do not improve   Follow Up Instructions: I discussed the assessment and treatment plan with the patient. The patient was provided an opportunity to ask questions and all were answered. The patient agreed with the plan and demonstrated an understanding of the instructions.  A copy of instructions were sent to the patient via MyChart unless otherwise noted below.    The patient was advised to call back or seek an in-person evaluation if the symptoms worsen or if the condition fails to improve as anticipated.  Time:  I spent 10 minutes with the patient via telehealth technology discussing the above problems/concerns.    Evelina Dun, FNP

## 2021-08-29 DIAGNOSIS — F331 Major depressive disorder, recurrent, moderate: Secondary | ICD-10-CM | POA: Diagnosis not present

## 2021-09-05 DIAGNOSIS — R41840 Attention and concentration deficit: Secondary | ICD-10-CM | POA: Diagnosis not present

## 2021-09-05 DIAGNOSIS — Z79899 Other long term (current) drug therapy: Secondary | ICD-10-CM | POA: Diagnosis not present

## 2021-09-19 DIAGNOSIS — L2089 Other atopic dermatitis: Secondary | ICD-10-CM | POA: Diagnosis not present

## 2021-09-27 DIAGNOSIS — F331 Major depressive disorder, recurrent, moderate: Secondary | ICD-10-CM | POA: Diagnosis not present

## 2021-10-11 IMAGING — MR MR CERVICAL SPINE W/O CM
4 of 5 series · 30 of 48 positions shown · non-contrast
Comparison: None.

CLINICAL DATA: Neck pain worsening over the last several years.
Bilateral arm numbness and weakness.

EXAM:
MRI CERVICAL SPINE WITHOUT CONTRAST
TECHNIQUE: Multiplanar, multisequence MR imaging of the cervical spine was
performed. No intravenous contrast was administered.

[Series 3: T2 · sagittal · 3.0mm · 0.66mm/px · 8 of 14 slices shown (1 of 2)]
[im 1/14]
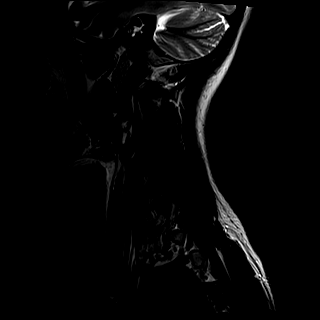
[im 2/14]
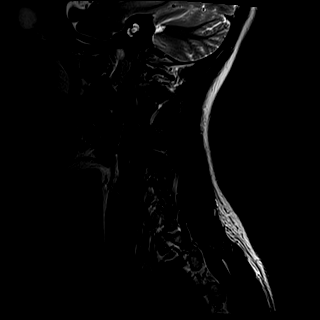
[im 4/14]
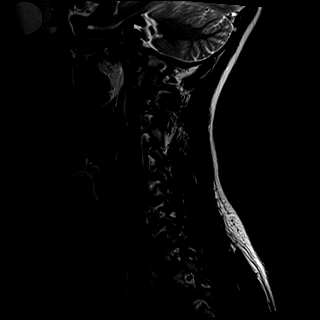
[im 6/14]
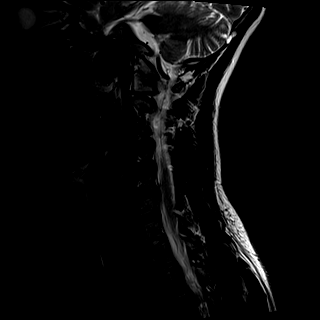
[im 8/14]
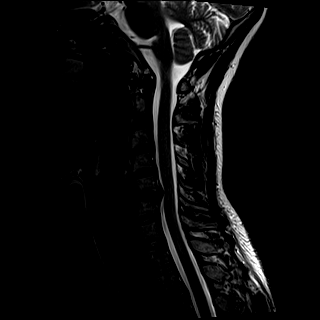
[im 10/14]
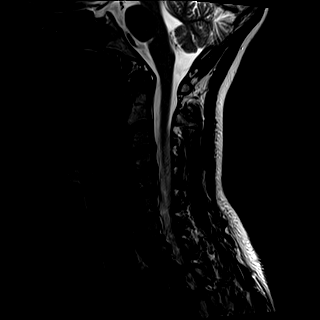
[im 12/14]
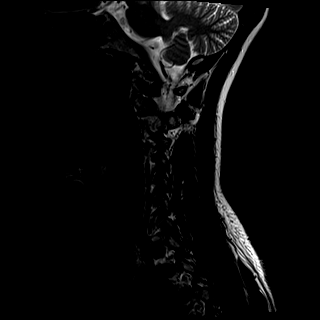
[im 14/14]
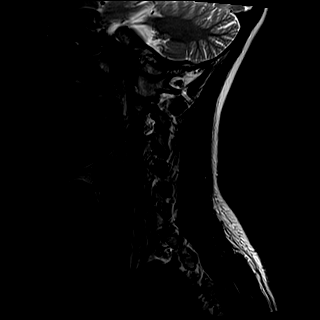

[Series 4: T1 · sagittal · 3.0mm · 0.41mm/px · 7 of 14 slices shown]
[im 1/14]
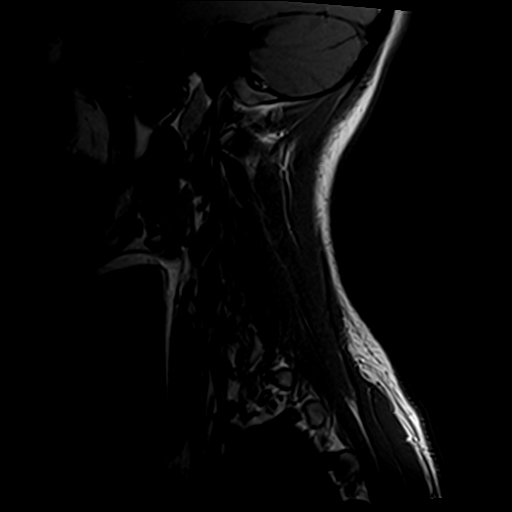
[im 3/14]
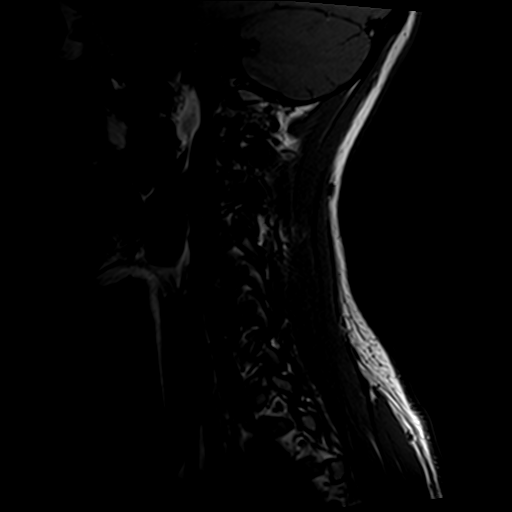
[im 5/14]
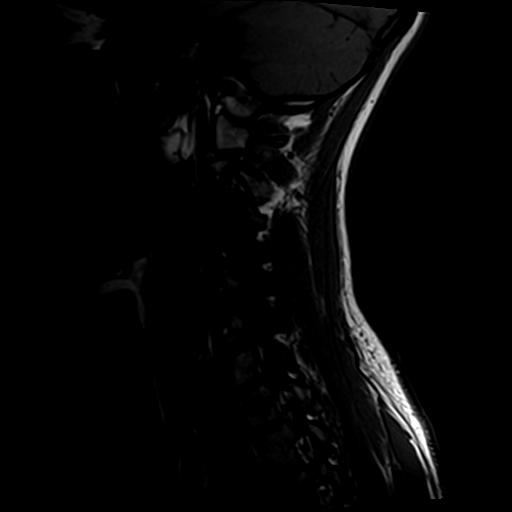
[im 7/14]
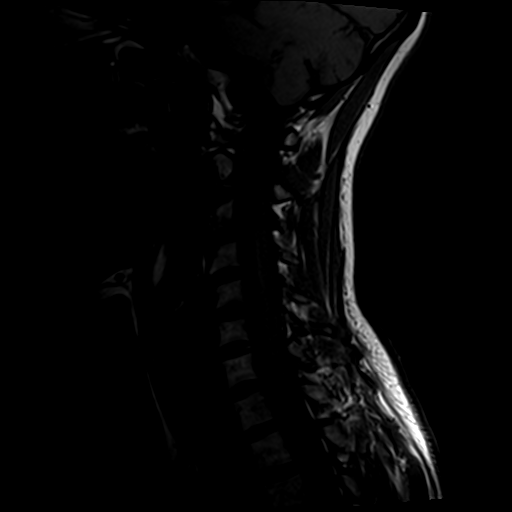
[im 9/14]
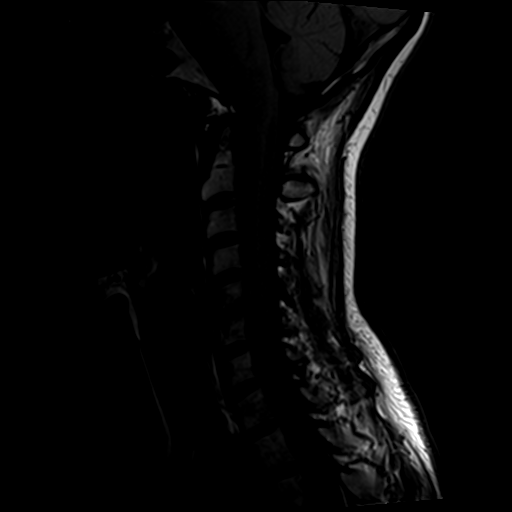
[im 11/14]
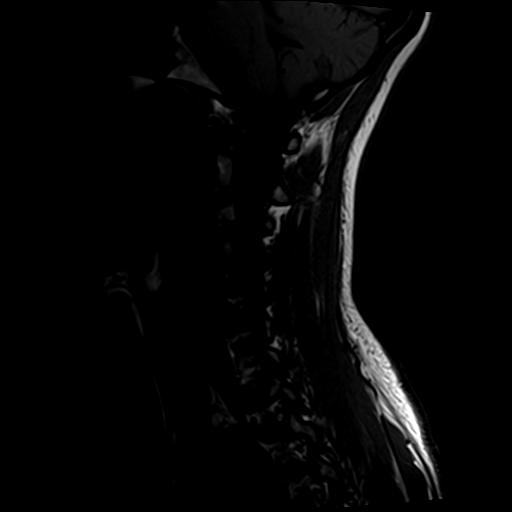
[im 14/14]
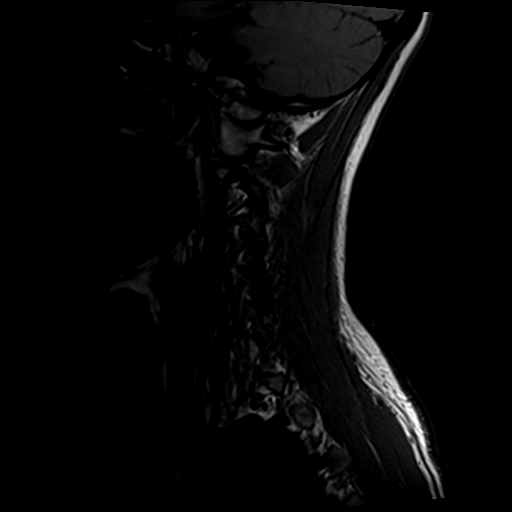

[Series 5: tir sag · sagittal · 3.0mm · 0.41mm/px · 6 of 14 slices shown]
[im 1/14]
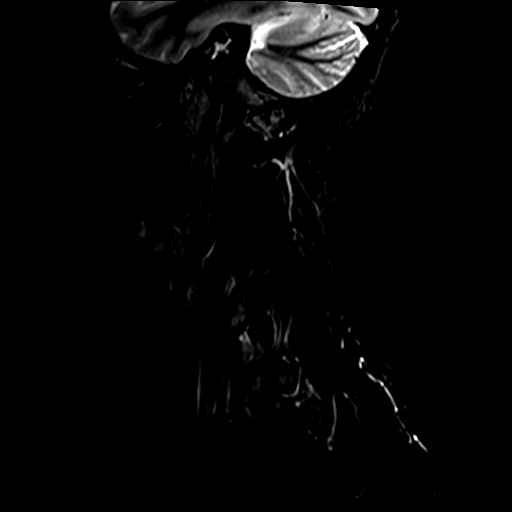
[im 3/14]
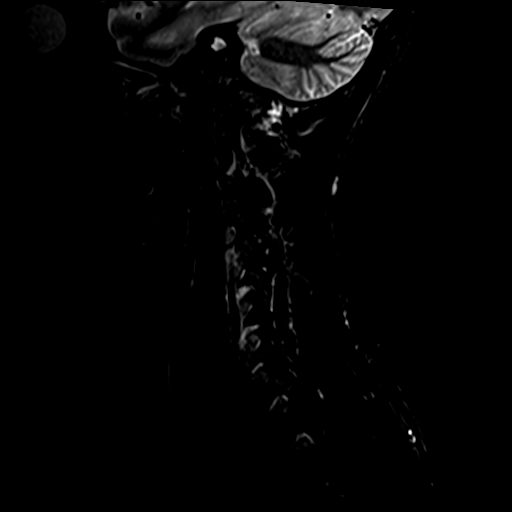
[im 5/14]
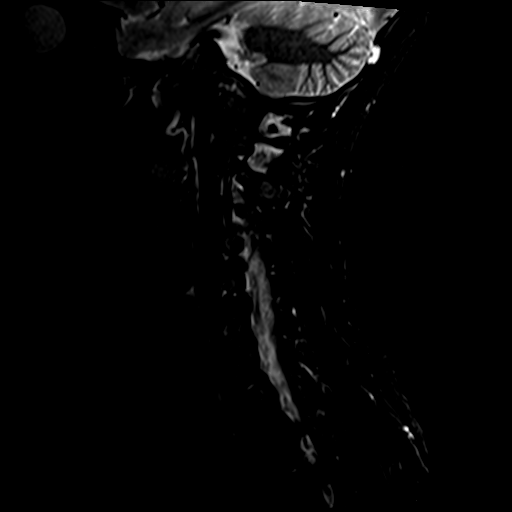
[im 7/14]
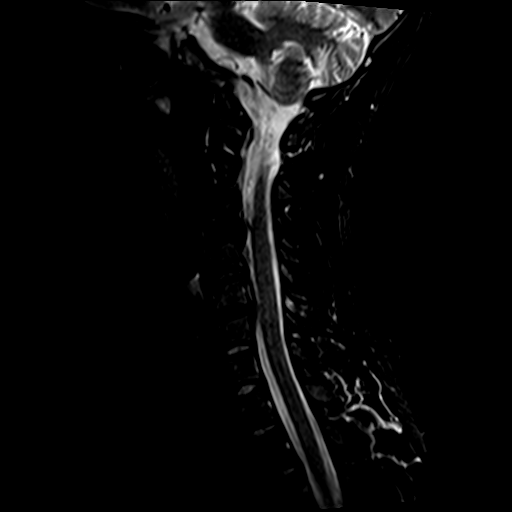
[im 9/14]
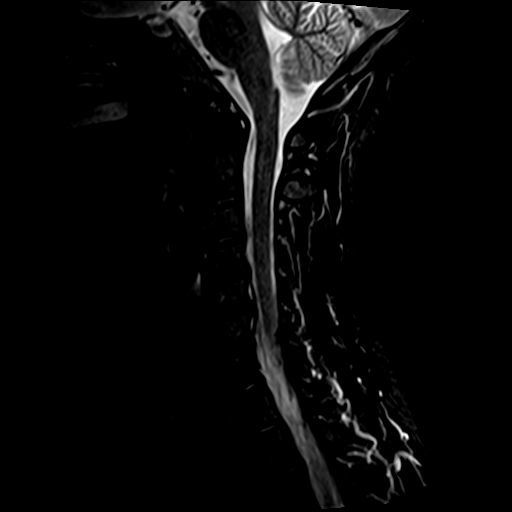
[im 11/14]
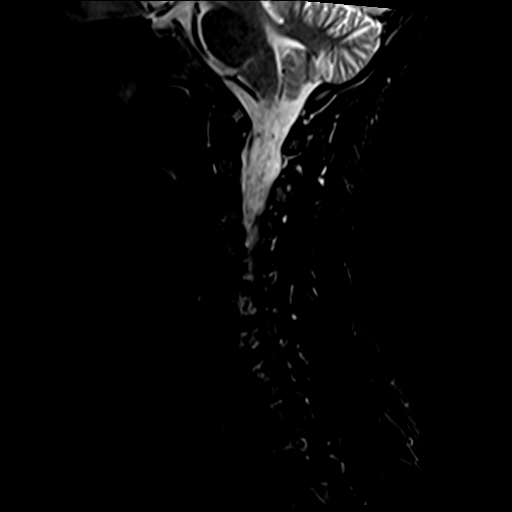

[Series 7: T2 · axial · 3.0mm · 0.70mm/px · z∈[-38,+50]mm · 9 of 25 slices shown (2 of 2)]
[im 1/25]
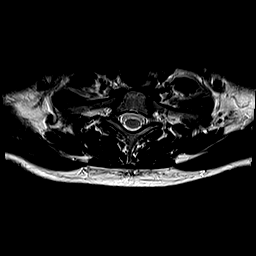
[im 5/25]
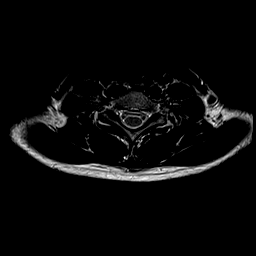
[im 9/25]
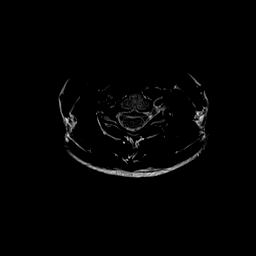
[im 11/25]
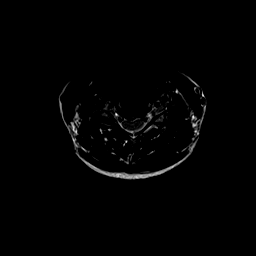
[im 13/25]
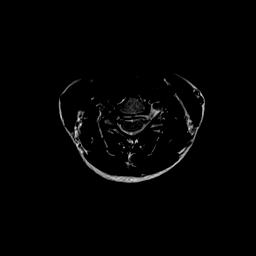
[im 15/25]
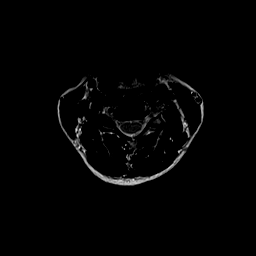
[im 17/25]
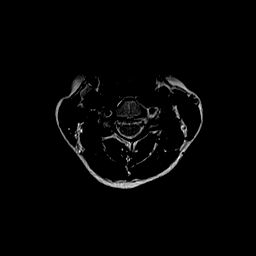
[im 21/25]
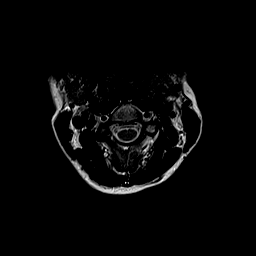
[im 25/25]
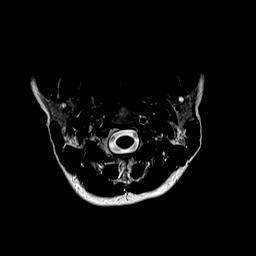

[30 of 48 positions shown; findings below may reference images not displayed]

FINDINGS: Alignment: Normal

Vertebrae: Normal

Cord: No primary cord lesion.  See below regarding stenosis.

Posterior Fossa, vertebral arteries, paraspinal tissues: Normal

Disc levels:

No abnormality at the foramen magnum, C1-2 or C2-3.

C3-4: Mild uncovertebral prominence.  No compressive stenosis.

C4-5: Mild uncovertebral prominence.  No compressive stenosis.

C5-6: Disc degeneration with a right paracentral disc herniation.
This effaces the ventral subarachnoid space and contacts the ventral
cord causing slight indentation. Subarachnoid space remains present
dorsal to the cord however. AP diameter in the midline measures 9
mm. No foraminal encroachment.

C6-7 and C7-T1: Normal interspaces.
IMPRESSION: C5-6: Right paracentral disc herniation that indents the thecal sac
and contacts the right ventral cord. No compressive stenosis. No
foraminal encroachment.

Mild uncovertebral hypertrophy at C3-4 and C4-5 but without
compressive stenosis.

## 2021-11-08 ENCOUNTER — Encounter: Payer: Self-pay | Admitting: Internal Medicine

## 2021-11-08 DIAGNOSIS — M5442 Lumbago with sciatica, left side: Secondary | ICD-10-CM

## 2021-12-05 ENCOUNTER — Ambulatory Visit: Payer: Managed Care, Other (non HMO) | Admitting: Physical Therapy

## 2021-12-28 ENCOUNTER — Telehealth: Payer: Managed Care, Other (non HMO) | Admitting: Physician Assistant

## 2021-12-28 DIAGNOSIS — H0015 Chalazion left lower eyelid: Secondary | ICD-10-CM | POA: Diagnosis not present

## 2021-12-28 MED ORDER — POLYMYXIN B-TRIMETHOPRIM 10000-0.1 UNIT/ML-% OP SOLN: 1.0000 [drp] | OPHTHALMIC | 0 refills | Status: DC

## 2021-12-28 NOTE — Progress Notes (Signed)
Virtual Visit Consent   Marie Hanson, you are scheduled for a virtual visit with a [facility] provider today. Just as with appointments in the office, your consent must be obtained to participate. Your consent will be active for this visit and any virtual visit you may have with one of our providers in the next 365 days. If you have a MyChart account, a copy of this consent can be sent to you electronically.  As this is a virtual visit, video technology does not allow for your provider to perform a traditional examination. This may limit your provider's ability to fully assess your condition. If your provider identifies any concerns that need to be evaluated in person or the need to arrange testing (such as labs, EKG, etc.), we will make arrangements to do so. Although advances in technology are sophisticated, we cannot ensure that it will always work on either your end or our end. If the connection with a video visit is poor, the visit may have to be switched to a telephone visit. With either a video or telephone visit, we are not always able to ensure that we have a secure connection.  By engaging in this virtual visit, you consent to the provision of healthcare and authorize for your insurance to be billed (if applicable) for the services provided during this visit. Depending on your insurance coverage, you may receive a charge related to this service.  I need to obtain your verbal consent now. Are you willing to proceed with your visit today? Marie Hanson has provided verbal consent on 12/28/2021 for a virtual visit (video or telephone). [name], PA-C  Date: 12/28/2021 12:21 PM  Virtual Visit via Video Note   I, [name], connected with  Donnita Farina  ([id number], 1980/10/19) on 12/28/21 at 12:15 PM EDT by a video-enabled telemedicine application and verified that I am speaking with the correct person using two identifiers.  Location: Patient:  Virtual Visit Location Patient: Home Provider: Virtual Visit Location Provider: Home Office   I discussed the limitations of evaluation and management by telemedicine and the availability of in person appointments. The patient expressed understanding and agreed to proceed.    History of Present Illness: Marie Hanson is a 41 y.o. who identifies as a female who was assigned female at birth, and is being seen today for possible pink eye.  HPI: Conjunctivitis  The current episode started 2 days ago. The onset was sudden. The problem has been gradually worsening. The problem is mild. Nothing relieves the symptoms. Nothing aggravates the symptoms. Associated symptoms include eye itching and eye discharge. Pertinent negatives include no fever, no decreased vision, no double vision, no photophobia, no URI, no eye pain and no eye redness. The eye pain is mild. The left eye is affected. The eye pain is not associated with movement. The eyelid exhibits swelling.   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  Problems:  Patient Active Problem List   Diagnosis Date Noted   Rosacea 03/05/2019   Anxiety 03/05/2019    Allergies: No Known Allergies Medications:  Current Outpatient Medications:    trimethoprim-polymyxin b (POLYTRIM) ophthalmic solution, Place 1 drop into the left eye every 4 (four) hours. X 5 days, Disp: 10 mL, Rfl: 0   ADZENYS XR-ODT 6.3 MG TBED, Take 1 tablet by mouth daily., Disp: , Rfl:    Azelaic Acid (FINACEA) 15 % gel, After skin is thoroughly washed and patted dry,  gently but thoroughly massage a thin film of azelaic acid cream into the affected area twice daily, in the morning and evening., Disp: 30 g, Rfl: 3   drospirenone-ethinyl estradiol (YAZ) 3-0.02 MG tablet, Take 1 tablet by mouth daily., Disp: 84 tablet, Rfl: 4   fluconazole (DIFLUCAN) 150 MG tablet, Take 1 tablet (150 mg total) by mouth every three (3) days as needed., Disp: 2 tablet, Rfl:  0   fluticasone (FLONASE) 50 MCG/ACT nasal spray, Place into both nostrils daily., Disp: , Rfl:    Multiple Vitamin (MULTIVITAMIN) tablet, Take 1 tablet by mouth daily., Disp: , Rfl:    Omega-3 Fatty Acids (FISH OIL) 1000 MG CAPS, Take by mouth., Disp: , Rfl:    RESTASIS 0.05 % ophthalmic emulsion, , Disp: , Rfl:    terconazole (TERAZOL 7) 0.4 % vaginal cream, Place 1 applicator vaginally at bedtime., Disp: 45 g, Rfl: 0  Observations/Objective: Patient is well-developed, well-nourished in no acute distress.  Resting comfortably at home.  Head is normocephalic, atraumatic.  No labored breathing.  Speech is clear and coherent with logical content.  Patient is alert and oriented at baseline.  Left eye is slightly injected with mild swelling noted in the left lower lid on the medial side near lacrimal duct  Assessment and Plan: 1. Chalazion left lower eyelid - trimethoprim-polymyxin b (POLYTRIM) ophthalmic solution; Place 1 drop into the left eye every 4 (four) hours. X 5 days  Dispense: 10 mL; Refill: 0  - Polytrim prescribed - Warm compresses - Good hand hygiene - Seek in person evaluation if symptoms continue or if symptoms worsen  Follow Up Instructions: I discussed the assessment and treatment plan with the patient. The patient was provided an opportunity to ask questions and all were answered. The patient agreed with the plan and demonstrated an understanding of the instructions.  A copy of instructions were sent to the patient via MyChart unless otherwise noted below.    The patient was advised to call back or seek an in-person evaluation if the symptoms worsen or if the condition fails to improve as anticipated.  Time:  I spent 8 minutes with the patient via telehealth technology discussing the above problems/concerns.    [name], PA-C

## 2021-12-28 NOTE — Patient Instructions (Signed)
Marie Hanson, thank you for joining Marie Loveless, PA-C for today's virtual visit.  While this provider is not your primary care provider (PCP), if your PCP is located in our provider database this encounter information will be shared with them immediately following your visit.  Consent: (Patient) Marie Hanson provided verbal consent for this virtual visit at the beginning of the encounter.  Current Medications:  Current Outpatient Medications:    trimethoprim-polymyxin b (POLYTRIM) ophthalmic solution, Place 1 drop into the left eye every 4 (four) hours. X 5 days, Disp: 10 mL, Rfl: 0   ADZENYS XR-ODT 6.3 MG TBED, Take 1 tablet by mouth daily., Disp: , Rfl:    Azelaic Acid (FINACEA) 15 % gel, After skin is thoroughly washed and patted dry, gently but thoroughly massage a thin film of azelaic acid cream into the affected area twice daily, in the morning and evening., Disp: 30 g, Rfl: 3   drospirenone-ethinyl estradiol (YAZ) 3-0.02 MG tablet, Take 1 tablet by mouth daily., Disp: 84 tablet, Rfl: 4   fluconazole (DIFLUCAN) 150 MG tablet, Take 1 tablet (150 mg total) by mouth every three (3) days as needed., Disp: 2 tablet, Rfl: 0   fluticasone (FLONASE) 50 MCG/ACT nasal spray, Place into both nostrils daily., Disp: , Rfl:    Multiple Vitamin (MULTIVITAMIN) tablet, Take 1 tablet by mouth daily., Disp: , Rfl:    Omega-3 Fatty Acids (FISH OIL) 1000 MG CAPS, Take by mouth., Disp: , Rfl:    RESTASIS 0.05 % ophthalmic emulsion, , Disp: , Rfl:    terconazole (TERAZOL 7) 0.4 % vaginal cream, Place 1 applicator vaginally at bedtime., Disp: 45 g, Rfl: 0   Medications ordered in this encounter:  Meds ordered this encounter  Medications   trimethoprim-polymyxin b (POLYTRIM) ophthalmic solution    Sig: Place 1 drop into the left eye every 4 (four) hours. X 5 days    Dispense:  10 mL    Refill:  0    Order Specific Question:   Supervising Provider    Answer:   Marie Hanson, Marie Hanson [[id number]]      *If you need refills on other medications prior to your next appointment, please contact your pharmacy*  Follow-Up: Call back or seek an in-person evaluation if the symptoms worsen or if the condition fails to improve as anticipated.  Other Instructions Chalazion  A chalazion is a swelling or lump on the eyelid. It can affect the upper eyelid or the lower eyelid. What are the causes? This condition may be caused by: Long-lasting (chronic) inflammation of the eyelid glands. A blocked oil gland in the eyelid. What are the signs or symptoms? Symptoms of this condition include: Swelling of the eyelid that: May spread to areas around the eye. May be painful. A hard lump on the eyelid. Blurry vision. The lump may make it hard to see out of the eye. How is this diagnosed? This condition is diagnosed with an examination of the eye. How is this treated? This condition is treated by applying a warm, moist cloth (warm compress) to the eyelid. If the condition does not improve, it may be treated with: Medicine that is applied to the eye. Oral medicines. Medicine that is injected into the chalazion. Surgery. Follow these instructions at home: Managing pain and swelling Apply a warm compress to the eyelid for 10-15 minutes, 4 to 6 times a day. This will help to open any blocked glands and to reduce redness and swelling. Take and apply over-the-counter  and prescription medicines only as told by your health care provider. General instructions Do not touch the chalazion. Do not try to remove the pus. Do not squeeze the chalazion or stick it with a pin or needle. Do not rub your eyes. Wash your hands often with soap and water for at least 20 seconds. Dry your hands with a clean towel. Keep your face, scalp, and eyebrows clean. Avoid wearing eye makeup. Keep all follow-up visits. This is important. Contact a health care provider if: Your eyelid is getting worse. You have a fever. The  chalazion does not break open (rupture) or go away on its own and your eyelid has not improved for 4 weeks. Get help right away if: You have pain in your eye. Your vision worsens. The chalazion becomes painful or red. The chalazion gets bigger. Summary A chalazion is a swelling or lump on the upper or lower eyelid. It may be caused by chronic inflammation or a blocked oil gland. Apply a warm compress to the eyelid for 10-15 minutes, 4 to 6 times a day. Keep your face, scalp, and eyebrows clean. This information is not intended to replace advice given to you by your health care provider. Make sure you discuss any questions you have with your health care provider. Document Revised: 10/05/2020 Document Reviewed: 10/05/2020 Elsevier Patient Education  2023 [employer]    If you have been instructed to have an in-person evaluation today at a local Urgent Care facility, please use the link below. It will take you to a list of all of our available [facility], including address, phone number and hours of operation. Please do not delay care.  [facility]  If you or a family member do not have a primary care provider, use the link below to schedule a visit and establish care. When you choose a [facility] primary care physician or advanced practice provider, you gain a long-term partner in health. Find a Primary Care Provider  Learn more about [facility]'s in-office and virtual care options: [facility] - Get Care Now

## 2022-03-28 ENCOUNTER — Ambulatory Visit (INDEPENDENT_AMBULATORY_CARE_PROVIDER_SITE_OTHER): Payer: Managed Care, Other (non HMO) | Admitting: Internal Medicine

## 2022-03-28 ENCOUNTER — Ambulatory Visit: Payer: Managed Care, Other (non HMO) | Admitting: Internal Medicine

## 2022-03-28 ENCOUNTER — Encounter: Payer: Self-pay | Admitting: Internal Medicine

## 2022-03-28 VITALS — BP 102/78 | HR 70 | Temp 98.4°F | Wt 108.6 lb

## 2022-03-28 DIAGNOSIS — F411 Generalized anxiety disorder: Secondary | ICD-10-CM | POA: Diagnosis not present

## 2022-03-28 DIAGNOSIS — R1031 Right lower quadrant pain: Secondary | ICD-10-CM | POA: Diagnosis not present

## 2022-03-28 DIAGNOSIS — M545 Low back pain, unspecified: Secondary | ICD-10-CM

## 2022-03-28 DIAGNOSIS — G8929 Other chronic pain: Secondary | ICD-10-CM

## 2022-03-28 DIAGNOSIS — M25531 Pain in right wrist: Secondary | ICD-10-CM

## 2022-03-28 MED ORDER — ESCITALOPRAM OXALATE 5 MG PO TABS: 5.0000 mg | ORAL_TABLET | Freq: Every day | ORAL | 1 refills | Status: DC

## 2022-03-28 MED ORDER — MELOXICAM 7.5 MG PO TABS: 7.5000 mg | ORAL_TABLET | Freq: Every day | ORAL | 0 refills | Status: DC

## 2022-03-28 NOTE — Progress Notes (Signed)
Established Patient Office Visit     CC/Reason for Visit: Discuss multiple complaints  HPI: Marie Hanson is a 41 y.o. female who is coming in today for the above mentioned reasons.  She has generalized anxiety disorder.  Has been off medication for some time.  She feels like she would like to start Lexapro as it has worked for her in the past.  She has been feeling more "moody".  She also has a myriad of muscle and joint aches.  Mainly right lower back, right groin area radiating all the way down into her knee.  Right wrist with pain over thumb index and middle finger.  She has been wearing a wrist brace which helps.  Past Medical/Surgical History: Past Medical History:  Diagnosis Date   Anxiety    Depression    Nipple pain x's 2 months   Rosacea     Past Surgical History:  Procedure Laterality Date   AUGMENTATION MAMMAPLASTY     breast enhancement & removal    Social History:  reports that she has never smoked. She has never used smokeless tobacco. She reports that she does not currently use alcohol. She reports that she does not use drugs.  Allergies: No Known Allergies  Family History:  Family History  Problem Relation Age of Onset   Diabetes Mother    Depression Father    Anxiety disorder Father      Current Outpatient Medications:    Azelaic Acid (FINACEA) 15 % gel, After skin is thoroughly washed and patted dry, gently but thoroughly massage a thin film of azelaic acid cream into the affected area twice daily, in the morning and evening., Disp: 30 g, Rfl: 3   doxycycline (VIBRAMYCIN) 100 MG capsule, Take 50 mg by mouth daily., Disp: , Rfl:    drospirenone-ethinyl estradiol (YAZ) 3-0.02 MG tablet, Take 1 tablet by mouth daily., Disp: 84 tablet, Rfl: 4   escitalopram (LEXAPRO) 5 MG tablet, Take 1 tablet (5 mg total) by mouth daily., Disp: 90 tablet, Rfl: 1   fluticasone (FLONASE) 50 MCG/ACT nasal spray, Place into both nostrils daily., Disp: , Rfl:     meloxicam (MOBIC) 7.5 MG tablet, Take 1 tablet (7.5 mg total) by mouth daily., Disp: 30 tablet, Rfl: 0   Multiple Vitamin (MULTIVITAMIN) tablet, Take 1 tablet by mouth daily., Disp: , Rfl:    Omega-3 Fatty Acids (FISH OIL) 1000 MG CAPS, Take by mouth., Disp: , Rfl:    RESTASIS 0.05 % ophthalmic emulsion, , Disp: , Rfl:   Review of Systems:  Constitutional: Denies fever, chills, diaphoresis, appetite change and fatigue.  HEENT: Denies photophobia, eye pain, redness, hearing loss, ear pain, congestion, sore throat, rhinorrhea, sneezing, mouth sores, trouble swallowing, neck pain, neck stiffness and tinnitus.   Respiratory: Denies SOB, DOE, cough, chest tightness,  and wheezing.   Cardiovascular: Denies chest pain, palpitations and leg swelling.  Gastrointestinal: Denies nausea, vomiting, abdominal pain, diarrhea, constipation, blood in stool and abdominal distention.  Genitourinary: Denies dysuria, urgency, frequency, hematuria, flank pain and difficulty urinating.  Endocrine: Denies: hot or cold intolerance, sweats, changes in hair or nails, polyuria, polydipsia. Musculoskeletal: Positive for myalgias, back pain, joint swelling, arthralgias. Skin: Denies pallor, rash and wound.  Neurological: Denies dizziness, seizures, syncope, weakness, light-headedness, numbness and headaches.  Hematological: Denies adenopathy. Easy bruising, personal or family bleeding history  Psychiatric/Behavioral: Denies suicidal ideation, mood changes, confusion, nervousness, sleep disturbance and agitation    Physical Exam: Vitals:   03/28/22 1003  BP: 102/78  Pulse: 70  Temp: 98.4 F (36.9 C)  TempSrc: Oral  SpO2: 99%  Weight: 108 lb 9.6 oz (49.3 kg)    Body mass index is 19.24 kg/m.   Constitutional: NAD, calm, comfortable Eyes: PERRL, lids and conjunctivae normal ENMT: Mucous membranes are moist.  Psychiatric: Normal judgment and insight. Alert and oriented x 3. Normal mood.    Impression and  Plan:  GAD (generalized anxiety disorder)  - Plan: escitalopram (LEXAPRO) 5 MG tablet Flowsheet Row Office Visit from 03/28/2022 in [facility] [place]  PHQ-9 Total Score 6     -Resume Lexapro 5 mg, follow-up in 8 to 12 weeks.   Groin pain, right - Plan: meloxicam (MOBIC) 7.5 MG tablet, Ambulatory referral to Sports Medicine  Chronic right-sided low back pain without sciatica - Plan: Ambulatory referral to Sports Medicine  Right wrist pain - Plan: Ambulatory referral to Sports Medicine  -For her different muscle and joint aches, I will initiate referral to sports medicine.  I think her right wrist may be some low-grade carpal tunnel so have advised that she resume wearing her wrist splint.  It sounds like she might possibly have some hip flexor tendinitis.  I will give her a 10-day run of meloxicam while she waits to see sports medicine.    Time spent:30 minutes reviewing chart, interviewing and examining patient and formulating plan of care.     Lelon Frohlich, MD [facility]

## 2022-04-03 ENCOUNTER — Ambulatory Visit: Payer: Managed Care, Other (non HMO) | Admitting: Sports Medicine

## 2022-04-04 ENCOUNTER — Ambulatory Visit: Payer: Managed Care, Other (non HMO) | Admitting: Radiology

## 2022-06-10 ENCOUNTER — Other Ambulatory Visit: Payer: Self-pay | Admitting: Internal Medicine

## 2022-06-11 ENCOUNTER — Other Ambulatory Visit: Payer: Self-pay | Admitting: Internal Medicine

## 2022-06-11 DIAGNOSIS — F411 Generalized anxiety disorder: Secondary | ICD-10-CM

## 2022-06-12 ENCOUNTER — Encounter: Payer: Self-pay | Admitting: Internal Medicine

## 2022-06-12 ENCOUNTER — Ambulatory Visit (INDEPENDENT_AMBULATORY_CARE_PROVIDER_SITE_OTHER): Payer: Managed Care, Other (non HMO) | Admitting: Internal Medicine

## 2022-06-12 VITALS — BP 98/68 | HR 53 | Temp 97.9°F | Ht 64.0 in | Wt 116.6 lb

## 2022-06-12 DIAGNOSIS — Z1239 Encounter for other screening for malignant neoplasm of breast: Secondary | ICD-10-CM | POA: Diagnosis not present

## 2022-06-12 DIAGNOSIS — F419 Anxiety disorder, unspecified: Secondary | ICD-10-CM

## 2022-06-12 DIAGNOSIS — Z0000 Encounter for general adult medical examination without abnormal findings: Secondary | ICD-10-CM | POA: Diagnosis not present

## 2022-06-12 LAB — COMPREHENSIVE METABOLIC PANEL
ALT: 21 U/L (ref 0–35)
AST: 16 U/L (ref 0–37)
Albumin: 4.1 g/dL (ref 3.5–5.2)
Alkaline Phosphatase: 51 U/L (ref 39–117)
BUN: 16 mg/dL (ref 6–23)
CO2: 26 mEq/L (ref 19–32)
Calcium: 9.1 mg/dL (ref 8.4–10.5)
Chloride: 101 mEq/L (ref 96–112)
Creatinine, Ser: 1.06 mg/dL (ref 0.40–1.20)
GFR: 65.82 mL/min (ref >60.00)
Glucose, Bld: 98 mg/dL (ref 70–99)
Potassium: 4.7 mEq/L (ref 3.5–5.1)
Sodium: 135 mEq/L (ref 135–145)
Total Bilirubin: 0.5 mg/dL (ref 0.2–1.2)
Total Protein: 7.1 g/dL (ref 6.0–8.3)

## 2022-06-12 LAB — CBC WITH DIFFERENTIAL/PLATELET
Basophils Absolute: 0.1 10*3/uL (ref 0.0–0.1)
Basophils Relative: 0.8 % (ref 0.0–3.0)
Eosinophils Absolute: 0.1 10*3/uL (ref 0.0–0.7)
Eosinophils Relative: 0.8 % (ref 0.0–5.0)
HCT: 41.2 % (ref 36.0–46.0)
Hemoglobin: 13.6 g/dL (ref 12.0–15.0)
Lymphocytes Relative: 22.6 % (ref 12.0–46.0)
Lymphs Abs: 1.5 10*3/uL (ref 0.7–4.0)
MCHC: 32.9 g/dL (ref 30.0–36.0)
MCV: 91.8 fl (ref 78.0–100.0)
Monocytes Absolute: 0.2 10*3/uL (ref 0.1–1.0)
Monocytes Relative: 3.8 % (ref 3.0–12.0)
Neutro Abs: 4.7 10*3/uL (ref 1.4–7.7)
Neutrophils Relative %: 72 % (ref 43.0–77.0)
Platelets: 219 10*3/uL (ref 150.0–400.0)
RBC: 4.49 Mil/uL (ref 3.87–5.11)
RDW: 13.8 % (ref 11.5–15.5)
WBC: 6.5 10*3/uL (ref 4.0–10.5)

## 2022-06-12 LAB — HEMOGLOBIN A1C: Hgb A1c MFr Bld: 5.5 % (ref 4.6–6.5)

## 2022-06-12 LAB — LIPID PANEL
Cholesterol: 170 mg/dL (ref 0–200)
HDL: 109 mg/dL (ref >39.00)
LDL Cholesterol: 45 mg/dL (ref 0–99)
NonHDL: 61.48
Total CHOL/HDL Ratio: 2
Triglycerides: 84 mg/dL (ref 0.0–149.0)
VLDL: 16.8 mg/dL (ref 0.0–40.0)

## 2022-06-12 LAB — VITAMIN B12: Vitamin B-12: 374 pg/mL (ref 211–911)

## 2022-06-12 LAB — VITAMIN D 25 HYDROXY (VIT D DEFICIENCY, FRACTURES): VITD: 58.46 ng/mL (ref 30.00–100.00)

## 2022-06-12 LAB — TSH: TSH: 0.76 u[IU]/mL (ref 0.35–5.50)

## 2022-06-12 NOTE — Progress Notes (Signed)
Established Patient Office Visit     CC/Reason for Visit: Annual preventive exam  HPI: Marie Hanson is a 41 y.o. female who is coming in today for the above mentioned reasons. Past Medical History is significant for: GAD, rosacea.  She is doing well.  She has routine eye and dental care.  She exercises routinely combination of cardio and strength training.  She recently had her flu vaccination.   Past Medical/Surgical History: Past Medical History:  Diagnosis Date   Anxiety    Depression    Nipple pain x's 2 months   Rosacea     Past Surgical History:  Procedure Laterality Date   AUGMENTATION MAMMAPLASTY     breast enhancement & removal    Social History:  reports that she has never smoked. She has never used smokeless tobacco. She reports that she does not currently use alcohol. She reports that she does not use drugs.  Allergies: No Known Allergies  Family History:  Family History  Problem Relation Age of Onset   Diabetes Mother    Depression Father    Anxiety disorder Father      Current Outpatient Medications:    Azelaic Acid 15 % gel, AFTER SKIN IS THOROUGHLY WASHED AND PATTED DRY, GENTLY AND THOROUGHLY APPLY A THIN FILM TO AFFECTED AREA IN THE MORNING AND AT NIGHT, Disp: 50 g, Rfl: 2   doxycycline (VIBRAMYCIN) 100 MG capsule, Take 50 mg by mouth daily., Disp: , Rfl:    drospirenone-ethinyl estradiol (YAZ) 3-0.02 MG tablet, Take 1 tablet by mouth daily., Disp: 84 tablet, Rfl: 4   escitalopram (LEXAPRO) 5 MG tablet, TAKE 1 TABLET (5 MG TOTAL) BY MOUTH DAILY., Disp: 90 tablet, Rfl: 1   meloxicam (MOBIC) 7.5 MG tablet, Take 1 tablet (7.5 mg total) by mouth daily., Disp: 30 tablet, Rfl: 0   mometasone (NASONEX) 50 MCG/ACT nasal spray, Place 2 sprays into the nose daily., Disp: , Rfl:    Multiple Vitamin (MULTIVITAMIN) tablet, Take 1 tablet by mouth daily., Disp: , Rfl:    Omega-3 Fatty Acids (FISH OIL) 1000 MG CAPS, Take by mouth., Disp: , Rfl:     RESTASIS 0.05 % ophthalmic emulsion, , Disp: , Rfl:   Review of Systems:  Constitutional: Denies fever, chills, diaphoresis, appetite change and fatigue.  HEENT: Denies photophobia, eye pain, redness, hearing loss, ear pain, congestion, sore throat, rhinorrhea, sneezing, mouth sores, trouble swallowing, neck pain, neck stiffness and tinnitus.   Respiratory: Denies SOB, DOE, cough, chest tightness,  and wheezing.   Cardiovascular: Denies chest pain, palpitations and leg swelling.  Gastrointestinal: Denies nausea, vomiting, abdominal pain, diarrhea, constipation, blood in stool and abdominal distention.  Genitourinary: Denies dysuria, urgency, frequency, hematuria, flank pain and difficulty urinating.  Endocrine: Denies: hot or cold intolerance, sweats, changes in hair or nails, polyuria, polydipsia. Musculoskeletal: Denies myalgias, back pain, joint swelling, arthralgias and gait problem.  Skin: Denies pallor, rash and wound.  Neurological: Denies dizziness, seizures, syncope, weakness, light-headedness, numbness and headaches.  Hematological: Denies adenopathy. Easy bruising, personal or family bleeding history  Psychiatric/Behavioral: Denies suicidal ideation, mood changes, confusion, nervousness, sleep disturbance and agitation    Physical Exam: Vitals:   06/12/22 0757  BP: 98/68  Pulse: (!) 53  Temp: 97.9 F (36.6 C)  TempSrc: Oral  SpO2: 96%  Weight: 116 lb 9.6 oz (52.9 kg)  Height: 5\' 4"  (1.626 m)    Body mass index is 20.01 kg/m.   Constitutional: NAD, calm, comfortable Eyes: PERRL, lids  and conjunctivae normal ENMT: Mucous membranes are moist. Posterior pharynx clear of any exudate or lesions. Normal dentition. Tympanic membrane is pearly white, no erythema or bulging. Neck: normal, supple, no masses, no thyromegaly Respiratory: clear to auscultation bilaterally, no wheezing, no crackles. Normal respiratory effort. No accessory muscle use.  Cardiovascular: Regular rate  and rhythm, no murmurs / rubs / gallops. No extremity edema. 2+ pedal pulses. No carotid bruits.  Abdomen: no tenderness, no masses palpated. No hepatosplenomegaly. Bowel sounds positive.  Musculoskeletal: no clubbing / cyanosis. No joint deformity upper and lower extremities. Good ROM, no contractures. Normal muscle tone.  Skin: no rashes, lesions, ulcers. No induration Neurologic: CN 2-12 grossly intact. Sensation intact, DTR normal. Strength 5/5 in all 4.  Psychiatric: Normal judgment and insight. Alert and oriented x 3. Normal mood.    Impression and Plan:  Encounter for preventive health examination  Anxiety - Plan: CBC with Differential/Platelet, Comprehensive metabolic panel, Hemoglobin A1c, Lipid panel, TSH, Vitamin B12, VITAMIN D 25 Hydroxy (Vit-D Deficiency, Fractures), VITAMIN D 25 Hydroxy (Vit-D Deficiency, Fractures), Vitamin B12, TSH, Lipid panel, Hemoglobin A1c, Comprehensive metabolic panel, CBC with Differential/Platelet    -Recommend routine eye and dental care. -Immunizations: All immunizations are up-to-date -Healthy lifestyle discussed in detail. -Labs to be updated today. -Colon cancer screening: Commence age 68 -Breast cancer screening: Mammogram requested -Cervical cancer screening: 08/2020 -Lung cancer screening: Not applicable -Prostate cancer screening: Not applicable -DEXA: Not applicable     Tayelor Osborne Philip Aspen, MD [facility]

## 2022-06-12 NOTE — Addendum Note (Signed)
Addended by: Westley Hummer B on: 06/12/2022 08:51 AM   Modules accepted: Orders

## 2022-06-18 ENCOUNTER — Ambulatory Visit (INDEPENDENT_AMBULATORY_CARE_PROVIDER_SITE_OTHER): Payer: Managed Care, Other (non HMO) | Admitting: Family Medicine

## 2022-06-18 ENCOUNTER — Ambulatory Visit (INDEPENDENT_AMBULATORY_CARE_PROVIDER_SITE_OTHER): Payer: Managed Care, Other (non HMO)

## 2022-06-18 ENCOUNTER — Ambulatory Visit: Payer: Self-pay

## 2022-06-18 VITALS — BP 112/80 | HR 57 | Ht 64.0 in | Wt 114.0 lb

## 2022-06-18 DIAGNOSIS — M5416 Radiculopathy, lumbar region: Secondary | ICD-10-CM

## 2022-06-18 DIAGNOSIS — M25571 Pain in right ankle and joints of right foot: Secondary | ICD-10-CM | POA: Diagnosis not present

## 2022-06-18 DIAGNOSIS — M25551 Pain in right hip: Secondary | ICD-10-CM

## 2022-06-18 DIAGNOSIS — M79672 Pain in left foot: Secondary | ICD-10-CM

## 2022-06-18 DIAGNOSIS — G8929 Other chronic pain: Secondary | ICD-10-CM

## 2022-06-18 DIAGNOSIS — R29898 Other symptoms and signs involving the musculoskeletal system: Secondary | ICD-10-CM | POA: Diagnosis not present

## 2022-06-18 DIAGNOSIS — I7300 Raynaud's syndrome without gangrene: Secondary | ICD-10-CM | POA: Insufficient documentation

## 2022-06-18 DIAGNOSIS — M79671 Pain in right foot: Secondary | ICD-10-CM

## 2022-06-18 NOTE — Progress Notes (Unsigned)
I, Philbert Riser, LAT, ATC acting as a scribe for [name], MD.  Subjective:    CC: R groin, R wrist, & low back pain  HPI: Pt is a 41 y/o female c/o pain in multiple areas of her body.  Bilat feet/ankle pain ongoing for 6 months, L>R. Pt locates pain to L posterior calcaneous and onto the Achilles tendon. On the R, pt locates pain to the talocrural joint w/ mechanical symptoms.  R groin pain x 1 year. Pt c/o tightness and pain/cracking in this region. Pt locates pain to the medial aspect of her R thigh.  Pt also c/o bilat buttock pain and numbness x 1 year.   Radiating pain: no LE numbness/tingling: yes LE weakness: no Aggravates: prolonged sitting Treatments tried: stretching, yoga, prednisone, IBU, meloxicam  Dx imaging: 01/16/20 C-spine MRI  Pertinent review of Systems: No fevers or chills.  Positive for episodes of fingers and toes getting cold and white-blue then red  Relevant historical information: Anxiety   Objective:    Vitals:   06/18/22 1428  BP: 112/80  Pulse: (!) 57  SpO2: 100%   General: Well Developed, well nourished, and in no acute distress.   MSK: L-spine: Normal appearing Nontender spinal midline.  Normal lumbar motion negative slump test.  Lower extremity strength is intact. Reflexes are intact.  Hips bilaterally normal-appearing normal motion without pain. Right hip abduction strength is diminished 4/5.  External rotation strength is intact. Left hip abduction strength intact external rotation strength diminished 4/5.  Right knee normal-appearing normal motion normal strength stable ligamentous exam nontender.  Right ankle: Normal-appearing normal motion nontender stable ligamentous exam intact strength.  Right foot and ankle broad forefoot with slightly high arch.  Otherwise normal-appearing nontender normal motion.  Normal strength.  At times during the exam her toes and fingers would occasionally become more whitish in  hue. Capillary refill and pulses were intact.  Lab and Radiology Results  X-ray images right ankle pelvis and lumbar spine obtained today personally and independently interpreted.  Lumbar spine: Minimal facet DJD L5-S1.  Otherwise normal-appearing lumbar spine.  No acute fractures are present.  No AVN present.  No acute fractures.  Pelvis: Trace ossific fragments present at acetabular rim bilateral hips.  This could represent labrum tears.  No significant hip DJD present.  Right ankle: No acute fractures.  No significant degenerative changes.  Await formal radiology review   Impression and Recommendations:    Assessment and Plan: 41 y.o. female with  Left foot and heel numbness.  This is thought to be most likely intermittent lumbar radiculopathy.  Dermatomal pattern is thought to be L5. This also occurs with some low back pain.  Core strength with physical therapy is a good starting point.  We talked about shoe fitment.  She wonders if custom orthotics will be helpful am not confident that is necessary at this point but certainly could be something we do in the future.  Right ankle pain unclear etiology.  Ankle x-ray looks pretty normal to me.  Again physical therapy is reasonable here.  Right knee pain thought to be related to some hip abductor weakness with right hip causing knee pain patellofemoral pain.  Work on quad strength and hip abduction strength.  Right hip pain.  She does have some calcific change of bilateral acetabular rims which could be due to some impingement or labral tearing.  She is preserved and pain-free hip range of motion so this is less likely.  She does  have weak hip abductor right and weak hip rotator left.  Plan for trial of PT.  Recheck in 4 to 6 weeks.  PDMP not reviewed this encounter. Orders Placed This Encounter  Procedures   DG Lumbar Spine 2-3 Views    Standing Status:   Future    Number of Occurrences:   1    Standing Expiration Date:    06/19/2023    Order Specific Question:   Reason for Exam (SYMPTOM  OR DIAGNOSIS REQUIRED)    Answer:   poss left rad    Order Specific Question:   Is patient pregnant?    Answer:   No    Order Specific Question:   Preferred imaging location?    Answer:   [facility]   DG Pelvis 1-2 Views    Standing Status:   Future    Number of Occurrences:   1    Standing Expiration Date:   06/19/2023    Order Specific Question:   Reason for Exam (SYMPTOM  OR DIAGNOSIS REQUIRED)    Answer:   hip pain    Order Specific Question:   Is patient pregnant?    Answer:   No    Order Specific Question:   Preferred imaging location?    Answer:   [facility]   DG Ankle Complete Right    Standing Status:   Future    Number of Occurrences:   1    Standing Expiration Date:   06/19/2023    Order Specific Question:   Reason for Exam (SYMPTOM  OR DIAGNOSIS REQUIRED)    Answer:   right ankle pain    Order Specific Question:   Is patient pregnant?    Answer:   No    Order Specific Question:   Preferred imaging location?    Answer:   [facility]   Ambulatory referral to Physical Therapy    Referral Priority:   Routine    Referral Type:   Physical Medicine    Referral Reason:   Specialty Services Required    Requested Specialty:   Physical Therapy    Number of Visits Requested:   1   No orders of the defined types were placed in this encounter.   Discussed warning signs or symptoms. Please see discharge instructions. Patient expresses understanding.   The above documentation has been reviewed and is accurate and complete Lynne Leader, M.D.

## 2022-06-18 NOTE — Patient Instructions (Addendum)
Thank you for coming in today.   Please get an Xray today before you leave   I've referred you to Physical Therapy.  Let us know if you don't hear from them in one week.   Fleet Feet. ?Supination Control shoes.   Recheck in 4-6 weeks.

## 2022-06-20 ENCOUNTER — Ambulatory Visit: Payer: Managed Care, Other (non HMO) | Attending: Family Medicine | Admitting: Physical Therapy

## 2022-06-20 DIAGNOSIS — R293 Abnormal posture: Secondary | ICD-10-CM | POA: Insufficient documentation

## 2022-06-20 DIAGNOSIS — R29898 Other symptoms and signs involving the musculoskeletal system: Secondary | ICD-10-CM | POA: Diagnosis not present

## 2022-06-20 DIAGNOSIS — M6281 Muscle weakness (generalized): Secondary | ICD-10-CM | POA: Insufficient documentation

## 2022-06-20 DIAGNOSIS — M5416 Radiculopathy, lumbar region: Secondary | ICD-10-CM | POA: Diagnosis present

## 2022-06-20 NOTE — Therapy (Signed)
OUTPATIENT PHYSICAL THERAPY THORACOLUMBAR EVALUATION   Patient Name: Marie Hanson MRN: [id number] DOB:1981/07/07, 41 y.o., female Today's Date: 06/20/2022   PT End of Session - 06/20/22 1449     Visit Number 1    Date for PT Re-Evaluation 08/29/22    PT Start Time 1233    PT Stop Time 1312    PT Time Calculation (min) 39 min    Activity Tolerance Patient tolerated treatment well    Behavior During Therapy [facility] for tasks assessed/performed             Past Medical History:  Diagnosis Date   Anxiety    Depression    Nipple pain x's 2 months   Rosacea    Past Surgical History:  Procedure Laterality Date   AUGMENTATION MAMMAPLASTY     breast enhancement & removal   Patient Active Problem List   Diagnosis Date Noted   Raynaud's phenomenon 06/18/2022   Rosacea 03/05/2019   Anxiety 03/05/2019    PCP: Philip Aspen, Gaynelle Adu  REFERRING PROVIDER: Rodolph Bong  REFERRING DIAG: Diagnosis M54.16 (ICD-10-CM) - Lumbar radiculitis R29.898 (ICD-10-CM) - Weakness of right hip  Rationale for Evaluation and Treatment: Rehabilitation  THERAPY DIAG:  Abnormal posture  Muscle weakness (generalized)  Radiculopathy, lumbar region  ONSET DATE: 06/18/2022  SUBJECTIVE:                                                                                                                                                                                           SUBJECTIVE STATEMENT: Patient reports sciatica starting over a year ago. She also reports R groin pain, knee pain and ankle pain. Numbness on L heel.  PERTINENT HISTORY:  Assessment and Plan: 42 y.o. female with Left foot and heel numbness.  This is thought to be most likely intermittent lumbar radiculopathy.  Dermatomal pattern is thought to be L5. This also occurs with some low back pain.  Core strength with physical therapy is a good starting point.  We talked about shoe fitment.  She wonders if custom orthotics will  be helpful am not confident that is necessary at this point but certainly could be something we do in the future.  PAIN:  Are you having pain? Yes: NPRS scale: 4/10 Pain location: groin Pain description: popping, also N/T in lower back and R leg Aggravating factors: spin bike, sitting Relieving factors: lying down.  PRECAUTIONS: None  WEIGHT BEARING RESTRICTIONS: No  FALLS:  Has patient fallen in last 6 months? No  LIVING ENVIRONMENT: Lives with: lives with their spouse Lives in: House/apartment Stairs: Yes: Internal: 14 steps; on left going up Has  following equipment at home: None  OCCUPATION: N/A, [employer] classes, spin bike, light weights, cardio, HIIT  PLOF: Independent  PATIENT GOALS: Reduce pain, improve ROM, keep working out.  NEXT MD VISIT:   OBJECTIVE:   DIAGNOSTIC FINDINGS:  Lab and Radiology Results   X-ray images right ankle pelvis and lumbar spine obtained today personally and independently interpreted.   Lumbar spine: Minimal facet DJD L5-S1.  Otherwise normal-appearing lumbar spine.  No acute fractures are present.  No AVN present.  No acute fractures.   Pelvis: Trace ossific fragments present at acetabular rim bilateral hips.  This could represent labrum tears.  No significant hip DJD present.   Right ankle: No acute fractures.  No significant degenerative changes  PATIENT SURVEYS:  FOTO 65  SCREENING FOR RED FLAGS: Bowel or bladder incontinence: No Spinal tumors: No Cauda equina syndrome: No Compression fracture: No Abdominal aneurysm: No  COGNITION: Overall cognitive status: Within functional limits for tasks assessed     SENSATION: Light touch: Impaired   MUSCLE LENGTH: Hamstrings: Right 90+ deg; Left 90+ deg Thomas test: WNL B  POSTURE: decreased lumbar lordosis, decreased thoracic kyphosis, and left pelvic obliquity  PALPATION: R LE 1/2" shorter than L. Mildly TTP R ITB. B proximal adductors tight.  LUMBAR ROM:   AROM eval   Flexion 100%  Extension 100  Right lateral flexion 100  Left lateral flexion 100  Right rotation 100  Left rotation 100   (Blank rows = not tested)  LOWER EXTREMITY ROM:   WNL throughout   LOWER EXTREMITY MMT:  All MMT was at least 4+/5 with the exceptions listed below.  MMT Right eval Left eval  Hip flexion    Hip extension 3+ 3+  Hip abduction 3+ 3+  Hip adduction    Hip internal rotation 3 4  Hip external rotation 3 3  Knee flexion    Knee extension    Ankle dorsiflexion    Ankle plantarflexion    Ankle inversion    Ankle eversion     (Blank rows = not tested)  LUMBAR SPECIAL TESTS:  Straight leg raise test: Negative  LE TESTS: Flexion IR, axial compression test-(-) B  GAIT: Distance walked: 107' in clinic Assistive device utilized: None Level of assistance: Complete Independence Comments: No obvious abnormalities  TODAY'S TREATMENT:                                                                                                                              DATE: 06/20/22 Patient education    PATIENT EDUCATION:  Education details: POC Person educated: Patient Education method: Explanation Education comprehension: verbalized understanding  HOME EXERCISE PROGRAM: TBD  ASSESSMENT:  CLINICAL IMPRESSION: Patient is a 41 y.o. who was seen today for physical therapy evaluation and treatment for multiple symptoms. She reports  R groin pain and crepitus, numbness into feet, L heel pain. X ray does show some degenerative changes at L5-S1, which may be responsible for  her radiating pain. She reports H/O sciatica, but was neg for pain or spasm in B piriformis. She is weak in B hips, particularly gluts and abd, and reports generally weak core. 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 She will benefit from PT for hip and trunk strengthening and stabilization, motor control re-education, and movement re-education to minimize any impingement and pain.  OBJECTIVE  IMPAIRMENTS: decreased balance, decreased coordination, decreased endurance, decreased ROM, decreased strength, increased muscle spasms, improper body mechanics, postural dysfunction, and pain.   ACTIVITY LIMITATIONS: lifting, bending, squatting, and locomotion level  PARTICIPATION LIMITATIONS: community activity  PERSONAL FACTORS: Behavior pattern and Past/current experiences are also affecting patient's functional outcome.   REHAB POTENTIAL: Good  CLINICAL DECISION MAKING: Stable/uncomplicated  EVALUATION COMPLEXITY: Moderate   GOALS: Goals reviewed with patient? Yes  SHORT TERM GOALS: Target date: 07/18/2022  I with basic HEP Baseline: Goal status: INITIAL  LONG TERM GOALS: Target date: 08/29/2022  I with final HEP Baseline:  Goal status: INITIAL  2.  Patient will achieve at least a 71 on FOTO. Baseline: 65 Goal status: INITIAL  3.  Patient will be able to perform her normal workout activities with pain, tingling, numbness of <3/10 Baseline: 5 Goal status: INITIAL  4.  Increase B hip strength to at least 4/5 Baseline: 3-(3+)/5 Goal status: INITIAL PLAN:  PT FREQUENCY: 2x/week  PT DURATION: 10 weeks  PLANNED INTERVENTIONS: Therapeutic exercises, Therapeutic activity, Neuromuscular re-education, Balance training, Gait training, Patient/Family education, Self Care, Joint mobilization, Dry Needling, Electrical stimulation, Cryotherapy, Moist heat, Taping, Ionotophoresis 4mg /ml Dexamethasone, and Manual therapy.  PLAN FOR NEXT SESSION: HEP   , DPT 06/20/2022, 2:53 PM

## 2022-06-21 NOTE — Progress Notes (Signed)
Right ankle x-ray looks normal to radiology.  No arthritis, no fractures no joint swelling.  Good-looking x-ray.  If needed MRI could be used in the future to see this more accurately.

## 2022-06-21 NOTE — Progress Notes (Signed)
Pelvis x-ray looks normal.  No fractures or significant hip arthritis is seen.

## 2022-06-21 NOTE — Progress Notes (Signed)
Lumbar spine MRI shows a little bit of arthritis at the lower portion of the spine.  Physical therapy should be quite helpful.

## 2022-06-25 ENCOUNTER — Ambulatory Visit: Payer: Managed Care, Other (non HMO) | Admitting: Physical Therapy

## 2022-06-25 ENCOUNTER — Encounter: Payer: Self-pay | Admitting: Physical Therapy

## 2022-06-25 DIAGNOSIS — R293 Abnormal posture: Secondary | ICD-10-CM | POA: Diagnosis not present

## 2022-06-25 DIAGNOSIS — M5416 Radiculopathy, lumbar region: Secondary | ICD-10-CM

## 2022-06-25 DIAGNOSIS — M6281 Muscle weakness (generalized): Secondary | ICD-10-CM

## 2022-06-25 NOTE — Therapy (Signed)
OUTPATIENT PHYSICAL THERAPY THORACOLUMBAR EVALUATION   Patient Name: Marie Hanson MRN: [id number] DOB:Nov 11, 1980, 41 y.o., female Today's Date: 06/25/2022   PT End of Session - 06/25/22 1058     Visit Number 2    Date for PT Re-Evaluation 08/29/22    PT Start Time 1100    PT Stop Time 1145    PT Time Calculation (min) 45 min    Activity Tolerance Patient tolerated treatment well    Behavior During Therapy [facility] for tasks assessed/performed             Past Medical History:  Diagnosis Date   Anxiety    Depression    Nipple pain x's 2 months   Rosacea    Past Surgical History:  Procedure Laterality Date   AUGMENTATION MAMMAPLASTY     breast enhancement & removal   Patient Active Problem List   Diagnosis Date Noted   Raynaud's phenomenon 06/18/2022   Rosacea 03/05/2019   Anxiety 03/05/2019    PCP: Philip Aspen, Gaynelle Adu  REFERRING PROVIDER: Rodolph Bong  REFERRING DIAG: Diagnosis M54.16 (ICD-10-CM) - Lumbar radiculitis R29.898 (ICD-10-CM) - Weakness of right hip  Rationale for Evaluation and Treatment: Rehabilitation  THERAPY DIAG:  Abnormal posture  Muscle weakness (generalized)  Radiculopathy, lumbar region  ONSET DATE: 06/18/2022  SUBJECTIVE:                                                                                                                                                                                           SUBJECTIVE STATEMENT: Pretty good, fells better today than the last time she was here. Mote strength training for the glutes, got fitted for new shoes/   PERTINENT HISTORY:  Assessment and Plan: 41 y.o. female with Left foot and heel numbness.  This is thought to be most likely intermittent lumbar radiculopathy.  Dermatomal pattern is thought to be L5. This also occurs with some low back pain.  Core strength with physical therapy is a good starting point.  We talked about shoe fitment.  She wonders if custom orthotics  will be helpful am not confident that is necessary at this point but certainly could be something we do in the future.  PAIN:  Are you having pain? Yes: NPRS scale: 3/10 Pain location: groin Pain description: Dull  Aggravating factors: spin bike, sitting Relieving factors: lying down.  PRECAUTIONS: None  WEIGHT BEARING RESTRICTIONS: No  FALLS:  Has patient fallen in last 6 months? No  LIVING ENVIRONMENT: Lives with: lives with their spouse Lives in: House/apartment Stairs: Yes: Internal: 14 steps; on left going up Has following equipment at home: None  OCCUPATION: N/A, Barre classes, spin bike, light weights, cardio, HIIT  PLOF: Independent  PATIENT GOALS: Reduce pain, improve ROM, keep working out.  NEXT MD VISIT:   OBJECTIVE:   DIAGNOSTIC FINDINGS:  Lab and Radiology Results   X-ray images right ankle pelvis and lumbar spine obtained today personally and independently interpreted.   Lumbar spine: Minimal facet DJD L5-S1.  Otherwise normal-appearing lumbar spine.  No acute fractures are present.  No AVN present.  No acute fractures.   Pelvis: Trace ossific fragments present at acetabular rim bilateral hips.  This could represent labrum tears.  No significant hip DJD present.   Right ankle: No acute fractures.  No significant degenerative changes  PATIENT SURVEYS:  FOTO 65  SCREENING FOR RED FLAGS: Bowel or bladder incontinence: No Spinal tumors: No Cauda equina syndrome: No Compression fracture: No Abdominal aneurysm: No  COGNITION: Overall cognitive status: Within functional limits for tasks assessed     SENSATION: Light touch: Impaired   MUSCLE LENGTH: Hamstrings: Right 90+ deg; Left 90+ deg Thomas test: WNL B  POSTURE: decreased lumbar lordosis, decreased thoracic kyphosis, and left pelvic obliquity  PALPATION: R LE 1/2" shorter than L. Mildly TTP R ITB. B proximal adductors tight.  LUMBAR ROM:   AROM eval  Flexion 100%  Extension 100   Right lateral flexion 100  Left lateral flexion 100  Right rotation 100  Left rotation 100   (Blank rows = not tested)  LOWER EXTREMITY ROM:   WNL throughout   LOWER EXTREMITY MMT:  All MMT was at least 4+/5 with the exceptions listed below.  MMT Right eval Left eval  Hip flexion    Hip extension 3+ 3+  Hip abduction 3+ 3+  Hip adduction    Hip internal rotation 3 4  Hip external rotation 3 3  Knee flexion    Knee extension    Ankle dorsiflexion    Ankle plantarflexion    Ankle inversion    Ankle eversion     (Blank rows = not tested)  LUMBAR SPECIAL TESTS:  Straight leg raise test: Negative  LE TESTS: Flexion IR, axial compression test-(-) B  GAIT: Distance walked: 68' in clinic Assistive device utilized: None Level of assistance: Complete Independence Comments: No obvious abnormalities  TODAY'S TREATMENT:                                                                                                                              DATE: 06/25/2022 L1.5 x 2 min each Bike L2.5 x 3 min 20lb resisted gait 4 way x 4 each  Hip Ext & Abd 5lb x10 each  Hip bridges LE on Pball Bridges, K2C, Oblq Dead bugs LE only  Bridge & 10/19/22 x10 each S2S OHP yellow ball 2x10   PATIENT EDUCATION:  Education details: POC Person educated: Patient Education method: Explanation Education comprehension: verbalized understanding  HOME EXERCISE PROGRAM: Access Code: [email or web address] URL: [email or web address] Date: 06/25/2022 Prepared by: Debroah Baller  Exercises - Marching Bridge  - 1 x daily - 7 x weekly - 3 sets - 10 reps - Supine Dead Bug with Leg Extension  - 1 x daily - 7 x weekly - 3 sets - 10 reps - Squat  - 1 x daily - 7 x weekly - 3 sets - 10 reps  ASSESSMENT:  CLINICAL IMPRESSION: Pt enters doing well. Session consisted of hip and core strength with functional and isolated interventions. Core weakness present with dead bugs. Cues for core engagement  required with brides. Pt tolerated session well and gives good effort.  OBJECTIVE IMPAIRMENTS: decreased balance, decreased coordination, decreased endurance, decreased ROM, decreased strength, increased muscle spasms, improper body mechanics, postural dysfunction, and pain.   ACTIVITY LIMITATIONS: lifting, bending, squatting, and locomotion level  PARTICIPATION LIMITATIONS: community activity  PERSONAL FACTORS: Behavior pattern and Past/current experiences are also affecting patient's functional outcome.   REHAB POTENTIAL: Good  CLINICAL DECISION MAKING: Stable/uncomplicated  EVALUATION COMPLEXITY: Moderate   GOALS: Goals reviewed with patient? Yes  SHORT TERM GOALS: Target date: 07/18/2022  I with basic HEP Baseline: Goal status: INITIAL  LONG TERM GOALS: Target date: 08/29/2022  I with final HEP Baseline:  Goal status: INITIAL  2.  Patient will achieve at least a 71 on FOTO. Baseline: 65 Goal status: INITIAL  3.  Patient will be able to perform her normal workout activities with pain, tingling, numbness of <3/10 Baseline: 5 Goal status: INITIAL  4.  Increase B hip strength to at least 4/5 Baseline: 3-(3+)/5 Goal status: INITIAL PLAN:  PT FREQUENCY: 2x/week  PT DURATION: 10 weeks  PLANNED INTERVENTIONS: Therapeutic exercises, Therapeutic activity, Neuromuscular re-education, Balance training, Gait training, Patient/Family education, Self Care, Joint mobilization, Dry Needling, Electrical stimulation, Cryotherapy, Moist heat, Taping, Ionotophoresis 4mg /ml Dexamethasone, and Manual therapy.  PLAN FOR NEXT SESSION: Core and hip strength   , PTA 06/25/2022, 10:59 AM

## 2022-06-27 ENCOUNTER — Ambulatory Visit: Payer: Managed Care, Other (non HMO) | Admitting: Physical Therapy

## 2022-06-27 NOTE — Therapy (Signed)
OUTPATIENT PHYSICAL THERAPY THORACOLUMBAR TREATMENT   Patient Name: Marie Hanson MRN: [id number] DOB:Mar 20, 1981, 41 y.o., female Today's Date: 06/28/2022   PT End of Session - 06/28/22 1145     Visit Number 3    Date for PT Re-Evaluation 08/29/22    PT Start Time 1145    PT Stop Time 1230    PT Time Calculation (min) 45 min    Activity Tolerance Patient tolerated treatment well    Behavior During Therapy [facility] for tasks assessed/performed             Past Medical History:  Diagnosis Date   Anxiety    Depression    Nipple pain x's 2 months   Rosacea    Past Surgical History:  Procedure Laterality Date   AUGMENTATION MAMMAPLASTY     breast enhancement & removal   Patient Active Problem List   Diagnosis Date Noted   Raynaud's phenomenon 06/18/2022   Rosacea 03/05/2019   Anxiety 03/05/2019    PCP: Philip Aspen, Gaynelle Adu  REFERRING PROVIDER: Rodolph Bong  REFERRING DIAG: Diagnosis M54.16 (ICD-10-CM) - Lumbar radiculitis R29.898 (ICD-10-CM) - Weakness of right hip  Rationale for Evaluation and Treatment: Rehabilitation  THERAPY DIAG:  Radiculopathy, lumbar region  Muscle weakness (generalized)  Abnormal posture  ONSET DATE: 06/18/2022  SUBJECTIVE:                                                                                                                                                                                           SUBJECTIVE STATEMENT: I am feeling much better, very slight pain.   PERTINENT HISTORY:  Assessment and Plan: 41 y.o. female with Left foot and heel numbness.  This is thought to be most likely intermittent lumbar radiculopathy.  Dermatomal pattern is thought to be L5. This also occurs with some low back pain.  Core strength with physical therapy is a good starting point.  We talked about shoe fitment.  She wonders if custom orthotics will be helpful am not confident that is necessary at this point but certainly could be  something we do in the future.  PAIN:  Are you having pain? Yes: NPRS scale: 1/10 Pain location: groin Pain description: Dull  Aggravating factors: spin bike, sitting Relieving factors: lying down.  PRECAUTIONS: None  WEIGHT BEARING RESTRICTIONS: No  FALLS:  Has patient fallen in last 6 months? No  LIVING ENVIRONMENT: Lives with: lives with their spouse Lives in: House/apartment Stairs: Yes: Internal: 14 steps; on left going up Has following equipment at home: None  OCCUPATION: N/A, Barre classes, spin bike, light weights, cardio, HIIT  PLOF: Independent  PATIENT  GOALS: Reduce pain, improve ROM, keep working out.  NEXT MD VISIT:   OBJECTIVE:   DIAGNOSTIC FINDINGS:  Lab and Radiology Results   X-ray images right ankle pelvis and lumbar spine obtained today personally and independently interpreted.   Lumbar spine: Minimal facet DJD L5-S1.  Otherwise normal-appearing lumbar spine.  No acute fractures are present.  No AVN present.  No acute fractures.   Pelvis: Trace ossific fragments present at acetabular rim bilateral hips.  This could represent labrum tears.  No significant hip DJD present.   Right ankle: No acute fractures.  No significant degenerative changes  PATIENT SURVEYS:  FOTO 65  SCREENING FOR RED FLAGS: Bowel or bladder incontinence: No Spinal tumors: No Cauda equina syndrome: No Compression fracture: No Abdominal aneurysm: No  COGNITION: Overall cognitive status: Within functional limits for tasks assessed     SENSATION: Light touch: Impaired   MUSCLE LENGTH: Hamstrings: Right 90+ deg; Left 90+ deg Thomas test: WNL B  POSTURE: decreased lumbar lordosis, decreased thoracic kyphosis, and left pelvic obliquity  PALPATION: R LE 1/2" shorter than L. Mildly TTP R ITB. B proximal adductors tight.  LUMBAR ROM:   AROM eval  Flexion 100%  Extension 100  Right lateral flexion 100  Left lateral flexion 100  Right rotation 100  Left rotation  100   (Blank rows = not tested)  LOWER EXTREMITY ROM:   WNL throughout   LOWER EXTREMITY MMT:  All MMT was at least 4+/5 with the exceptions listed below.  MMT Right eval Left eval  Hip flexion    Hip extension 3+ 3+  Hip abduction 3+ 3+  Hip adduction    Hip internal rotation 3 4  Hip external rotation 3 3  Knee flexion    Knee extension    Ankle dorsiflexion    Ankle plantarflexion    Ankle inversion    Ankle eversion     (Blank rows = not tested)  LUMBAR SPECIAL TESTS:  Straight leg raise test: Negative  LE TESTS: Flexion IR, axial compression test-(-) B  GAIT: Distance walked: 47' in clinic Assistive device utilized: None Level of assistance: Complete Independence Comments: No obvious abnormalities  TODAY'S TREATMENT:                                                                                                                              DATE: 06/28/22 Leg ext 10# 2x10 HS curls 20# 2x10 Leg press 20# x10, 30# x10 Step ups 10"  Bridges with ball squeeze 2x10  Clamshells SL 2x10  Lateral band walks  S2S yellow ball OHP 2x10  Double leg lifts 2x10  AR press 10# 2x10 blackTB crunches 2x10  06/25/2022 L1.5 x 2 min each Bike L2.5 x 3 min 20lb resisted gait 4 way x 4 each  Hip Ext & Abd 5lb x10 each  Hip bridges LE on [place] Bridges, K2C, Oblq Dead bugs LE only  Bridge & 09-Nov-2022 x10 each S2S  OHP yellow ball 2x10   PATIENT EDUCATION:  Education details: POC Person educated: Patient Education method: Explanation Education comprehension: verbalized understanding  HOME EXERCISE PROGRAM: Access Code: [email or web address] URL: [email or web address] Date: 06/25/2022 Prepared by: Cheri Fowler  Exercises - Marching Bridge  - 1 x daily - 7 x weekly - 3 sets - 10 reps - Supine Dead Bug with Leg Extension  - 1 x daily - 7 x weekly - 3 sets - 10 reps - Squat  - 1 x daily - 7 x weekly - 3 sets - 10 reps  ASSESSMENT:  CLINICAL IMPRESSION: Pt  enters doing well, states she is not having much pain anymore. Session consisted of hip and core strength with functional and isolated interventions. Has most difficulty with core exercises. Pt tolerated session well   OBJECTIVE IMPAIRMENTS: decreased balance, decreased coordination, decreased endurance, decreased ROM, decreased strength, increased muscle spasms, improper body mechanics, postural dysfunction, and pain.   ACTIVITY LIMITATIONS: lifting, bending, squatting, and locomotion level  PARTICIPATION LIMITATIONS: community activity  PERSONAL FACTORS: Behavior pattern and Past/current experiences are also affecting patient's functional outcome.   REHAB POTENTIAL: Good  CLINICAL DECISION MAKING: Stable/uncomplicated  EVALUATION COMPLEXITY: Moderate   GOALS: Goals reviewed with patient? Yes  SHORT TERM GOALS: Target date: 07/18/2022  I with basic HEP Baseline: Goal status: INITIAL  LONG TERM GOALS: Target date: 08/29/2022  I with final HEP Baseline:  Goal status: INITIAL  2.  Patient will achieve at least a 71 on FOTO. Baseline: 65 Goal status: INITIAL  3.  Patient will be able to perform her normal workout activities with pain, tingling, numbness of <3/10 Baseline: 5 Goal status: INITIAL  4.  Increase B hip strength to at least 4/5 Baseline: 3-(3+)/5 Goal status: INITIAL PLAN:  PT FREQUENCY: 2x/week  PT DURATION: 10 weeks  PLANNED INTERVENTIONS: Therapeutic exercises, Therapeutic activity, Neuromuscular re-education, Balance training, Gait training, Patient/Family education, Self Care, Joint mobilization, Dry Needling, Electrical stimulation, Cryotherapy, Moist heat, Taping, Ionotophoresis 4mg /ml Dexamethasone, and Manual therapy.  PLAN FOR NEXT SESSION: Core and hip strength   Cheri Fowler, PTA 06/28/2022, 12:29 PM

## 2022-06-28 ENCOUNTER — Ambulatory Visit: Payer: Managed Care, Other (non HMO)

## 2022-06-28 DIAGNOSIS — M5416 Radiculopathy, lumbar region: Secondary | ICD-10-CM

## 2022-06-28 DIAGNOSIS — R293 Abnormal posture: Secondary | ICD-10-CM

## 2022-06-28 DIAGNOSIS — M6281 Muscle weakness (generalized): Secondary | ICD-10-CM

## 2022-07-02 NOTE — Therapy (Signed)
OUTPATIENT PHYSICAL THERAPY THORACOLUMBAR TREATMENT   Patient Name: Marie Hanson MRN: [id number] DOB:1980-08-22, 41 y.o., female Today's Date: 07/03/2022   PT End of Session - 07/03/22 1104     Visit Number 4    Date for PT Re-Evaluation 08/29/22    PT Start Time 1100    Activity Tolerance Patient tolerated treatment well    Behavior During Therapy [facility] for tasks assessed/performed              Past Medical History:  Diagnosis Date   Anxiety    Depression    Nipple pain x's 2 months   Rosacea    Past Surgical History:  Procedure Laterality Date   AUGMENTATION MAMMAPLASTY     breast enhancement & removal   Patient Active Problem List   Diagnosis Date Noted   Raynaud's phenomenon 06/18/2022   Rosacea 03/05/2019   Anxiety 03/05/2019    PCP: Philip Aspen, Gaynelle Adu  REFERRING PROVIDER: Rodolph Bong  REFERRING DIAG: Diagnosis M54.16 (ICD-10-CM) - Lumbar radiculitis R29.898 (ICD-10-CM) - Weakness of right hip  Rationale for Evaluation and Treatment: Rehabilitation  THERAPY DIAG:  Radiculopathy, lumbar region  Muscle weakness (generalized)  Abnormal posture  ONSET DATE: 06/18/2022  SUBJECTIVE:                                                                                                                                                                                           SUBJECTIVE STATEMENT: I am good not having pain.   PERTINENT HISTORY:  Assessment and Plan: 41 y.o. female with Left foot and heel numbness.  This is thought to be most likely intermittent lumbar radiculopathy.  Dermatomal pattern is thought to be L5. This also occurs with some low back pain.  Core strength with physical therapy is a good starting point.  We talked about shoe fitment.  She wonders if custom orthotics will be helpful am not confident that is necessary at this point but certainly could be something we do in the future.  PAIN:  Are you having pain? Yes: NPRS  scale: 0/10 Pain location: groin Pain description: Dull  Aggravating factors: spin bike, sitting Relieving factors: lying down.  PRECAUTIONS: None  WEIGHT BEARING RESTRICTIONS: No  FALLS:  Has patient fallen in last 6 months? No  LIVING ENVIRONMENT: Lives with: lives with their spouse Lives in: House/apartment Stairs: Yes: Internal: 14 steps; on left going up Has following equipment at home: None  OCCUPATION: N/A, Barre classes, spin bike, light weights, cardio, HIIT  PLOF: Independent  PATIENT GOALS: Reduce pain, improve ROM, keep working out.  NEXT MD VISIT:   OBJECTIVE:  DIAGNOSTIC FINDINGS:  Lab and Radiology Results   X-ray images right ankle pelvis and lumbar spine obtained today personally and independently interpreted.   Lumbar spine: Minimal facet DJD L5-S1.  Otherwise normal-appearing lumbar spine.  No acute fractures are present.  No AVN present.  No acute fractures.   Pelvis: Trace ossific fragments present at acetabular rim bilateral hips.  This could represent labrum tears.  No significant hip DJD present.   Right ankle: No acute fractures.  No significant degenerative changes  PATIENT SURVEYS:  FOTO 65  SCREENING FOR RED FLAGS: Bowel or bladder incontinence: No Spinal tumors: No Cauda equina syndrome: No Compression fracture: No Abdominal aneurysm: No  COGNITION: Overall cognitive status: Within functional limits for tasks assessed     SENSATION: Light touch: Impaired   MUSCLE LENGTH: Hamstrings: Right 90+ deg; Left 90+ deg Thomas test: WNL B  POSTURE: decreased lumbar lordosis, decreased thoracic kyphosis, and left pelvic obliquity  PALPATION: R LE 1/2" shorter than L. Mildly TTP R ITB. B proximal adductors tight.  LUMBAR ROM:   AROM eval  Flexion 100%  Extension 100  Right lateral flexion 100  Left lateral flexion 100  Right rotation 100  Left rotation 100   (Blank rows = not tested)  LOWER EXTREMITY ROM:   WNL  throughout   LOWER EXTREMITY MMT:  All MMT was at least 4+/5 with the exceptions listed below.  MMT Right eval Left eval  Hip flexion    Hip extension 3+ 3+  Hip abduction 3+ 3+  Hip adduction    Hip internal rotation 3 4  Hip external rotation 3 3  Knee flexion    Knee extension    Ankle dorsiflexion    Ankle plantarflexion    Ankle inversion    Ankle eversion     (Blank rows = not tested)  LUMBAR SPECIAL TESTS:  Straight leg raise test: Negative  LE TESTS: Flexion IR, axial compression test-(-) B  GAIT: Distance walked: 72' in clinic Assistive device utilized: None Level of assistance: Complete Independence Comments: No obvious abnormalities  TODAY'S TREATMENT:                                                                                                                              DATE: July 11, 2022 NuStep L5 x33mins  Dead bugs x10 Bridge & March x10 each x5  SL bridge x10 each  Yellow ball elbow to knee crunches x10 each  [place] twists 2x10 w/yellow ball  Resisted trunk rotations blueTB x10 each side  Birddogs x10 AR press 15# 2x10  Rows and lats 20# 2x10 BlackTB ext 2x10    06/28/22 Leg ext 10# 2x10 HS curls 20# 2x10 Leg press 20# x10, 30# x10 Step ups 10"  Bridges with ball squeeze 2x10  Clamshells SL 2x10  Lateral band walks  S2S yellow ball OHP 2x10  Double leg lifts 2x10  AR press 10# 2x10 blackTB crunches 2x10  06/25/2022 L1.5 x  2 min each Bike L2.5 x 3 min 20lb resisted gait 4 way x 4 each  Hip Ext & Abd 5lb x10 each  Hip bridges LE on Pball Bridges, K2C, Oblq Dead bugs LE only  Bridge & 15-Nov-2022 x10 each S2S OHP yellow ball 2x10   PATIENT EDUCATION:  Education details: POC Person educated: Patient Education method: Explanation Education comprehension: verbalized understanding  HOME EXERCISE PROGRAM: Access Code: [email or web address] URL: [email or web address] Date: 06/25/2022 Prepared by: Debroah Baller  Exercises -  Marching Bridge  - 1 x daily - 7 x weekly - 3 sets - 10 reps - Supine Dead Bug with Leg Extension  - 1 x daily - 7 x weekly - 3 sets - 10 reps - Squat  - 1 x daily - 7 x weekly - 3 sets - 10 reps  ASSESSMENT:  CLINICAL IMPRESSION: Pt enters doing well, states she is not having much pain anymore. She states core exercises have really been helping and would like to work on some more that she could try at home. We focused mostly on core today, she tolerates session well.   OBJECTIVE IMPAIRMENTS: decreased balance, decreased coordination, decreased endurance, decreased ROM, decreased strength, increased muscle spasms, improper body mechanics, postural dysfunction, and pain.   ACTIVITY LIMITATIONS: lifting, bending, squatting, and locomotion level  PARTICIPATION LIMITATIONS: community activity  PERSONAL FACTORS: Behavior pattern and Past/current experiences are also affecting patient's functional outcome.   REHAB POTENTIAL: Good  CLINICAL DECISION MAKING: Stable/uncomplicated  EVALUATION COMPLEXITY: Moderate   GOALS: Goals reviewed with patient? Yes  SHORT TERM GOALS: Target date: 07/18/2022  I with basic HEP Baseline: Goal status: INITIAL  LONG TERM GOALS: Target date: 08/29/2022  I with final HEP Baseline:  Goal status: INITIAL  2.  Patient will achieve at least a 71 on FOTO. Baseline: 65 Goal status: INITIAL  3.  Patient will be able to perform her normal workout activities with pain, tingling, numbness of <3/10 Baseline: 5 Goal status: INITIAL  4.  Increase B hip strength to at least 4/5 Baseline: 3-(3+)/5 Goal status: INITIAL PLAN:  PT FREQUENCY: 2x/week  PT DURATION: 10 weeks  PLANNED INTERVENTIONS: Therapeutic exercises, Therapeutic activity, Neuromuscular re-education, Balance training, Gait training, Patient/Family education, Self Care, Joint mobilization, Dry Needling, Electrical stimulation, Cryotherapy, Moist heat, Taping, Ionotophoresis 4mg /ml  Dexamethasone, and Manual therapy.  PLAN FOR NEXT SESSION: Core and hip strength  , DPT 07/03/2022, 11:41 AM

## 2022-07-03 ENCOUNTER — Ambulatory Visit: Payer: Managed Care, Other (non HMO)

## 2022-07-03 DIAGNOSIS — M5416 Radiculopathy, lumbar region: Secondary | ICD-10-CM

## 2022-07-03 DIAGNOSIS — R293 Abnormal posture: Secondary | ICD-10-CM | POA: Diagnosis not present

## 2022-07-03 DIAGNOSIS — M6281 Muscle weakness (generalized): Secondary | ICD-10-CM

## 2022-07-09 ENCOUNTER — Ambulatory Visit: Payer: Managed Care, Other (non HMO) | Admitting: Physical Therapy

## 2022-07-11 ENCOUNTER — Other Ambulatory Visit: Payer: Self-pay | Admitting: Internal Medicine

## 2022-07-11 ENCOUNTER — Ambulatory Visit: Payer: Managed Care, Other (non HMO) | Admitting: Physical Therapy

## 2022-07-11 DIAGNOSIS — Z3041 Encounter for surveillance of contraceptive pills: Secondary | ICD-10-CM

## 2022-07-16 ENCOUNTER — Ambulatory Visit: Payer: Managed Care, Other (non HMO) | Attending: Family Medicine | Admitting: Physical Therapy

## 2022-07-16 ENCOUNTER — Encounter: Payer: Self-pay | Admitting: Physical Therapy

## 2022-07-16 DIAGNOSIS — M7550 Bursitis of unspecified shoulder: Secondary | ICD-10-CM | POA: Diagnosis present

## 2022-07-16 DIAGNOSIS — M6281 Muscle weakness (generalized): Secondary | ICD-10-CM | POA: Insufficient documentation

## 2022-07-16 DIAGNOSIS — M5416 Radiculopathy, lumbar region: Secondary | ICD-10-CM | POA: Diagnosis present

## 2022-07-16 DIAGNOSIS — M542 Cervicalgia: Secondary | ICD-10-CM | POA: Insufficient documentation

## 2022-07-16 DIAGNOSIS — M7590 Shoulder lesion, unspecified, unspecified shoulder: Secondary | ICD-10-CM | POA: Diagnosis present

## 2022-07-16 DIAGNOSIS — R293 Abnormal posture: Secondary | ICD-10-CM | POA: Insufficient documentation

## 2022-07-16 DIAGNOSIS — M25512 Pain in left shoulder: Secondary | ICD-10-CM | POA: Diagnosis present

## 2022-07-16 DIAGNOSIS — G8929 Other chronic pain: Secondary | ICD-10-CM | POA: Insufficient documentation

## 2022-07-16 NOTE — Therapy (Signed)
OUTPATIENT PHYSICAL THERAPY THORACOLUMBAR TREATMENT   Patient Name: Marie Hanson MRN: [id number] DOB:1980-11-30, 41 y.o., female Today's Date: 07/16/2022   PT End of Session - 07/16/22 1315     Visit Number 5    Date for PT Re-Evaluation 08/29/22    PT Start Time 1312    PT Stop Time 8315    PT Time Calculation (min) 43 min    Activity Tolerance Patient tolerated treatment well    Behavior During Therapy [facility] for tasks assessed/performed               Past Medical History:  Diagnosis Date   Anxiety    Depression    Nipple pain x's 2 months   Rosacea    Past Surgical History:  Procedure Laterality Date   AUGMENTATION MAMMAPLASTY     breast enhancement & removal   Patient Active Problem List   Diagnosis Date Noted   Raynaud's phenomenon 06/18/2022   Rosacea 03/05/2019   Anxiety 03/05/2019    PCP: Isaac Bliss, Nelida Meuse  REFERRING PROVIDER: Gregor Hams  REFERRING DIAG: Diagnosis M54.16 (ICD-10-CM) - Lumbar radiculitis R29.898 (ICD-10-CM) - Weakness of right hip  Rationale for Evaluation and Treatment: Rehabilitation  THERAPY DIAG:  Radiculopathy, lumbar region  Muscle weakness (generalized)  Abnormal posture  ONSET DATE: 06/18/2022  SUBJECTIVE:                                                                                                                                                                                           SUBJECTIVE STATEMENT: Patient reports she is feeling much better. Emphasizing glut and abdominal strength.  PERTINENT HISTORY:  Assessment and Plan: 41 y.o. female with Left foot and heel numbness.  This is thought to be most likely intermittent lumbar radiculopathy.  Dermatomal pattern is thought to be L5. This also occurs with some low back pain.  Core strength with physical therapy is a good starting point.  We talked about shoe fitment.  She wonders if custom orthotics will be helpful am not confident that is  necessary at this point but certainly could be something we do in the future.  PAIN:  Are you having pain? Yes: NPRS scale: 0/10 Pain location: groin Pain description: Dull  Aggravating factors: spin bike, sitting Relieving factors: lying down.  PRECAUTIONS: None  WEIGHT BEARING RESTRICTIONS: No  FALLS:  Has patient fallen in last 6 months? No  LIVING ENVIRONMENT: Lives with: lives with their spouse Lives in: House/apartment Stairs: Yes: Internal: 14 steps; on left going up Has following equipment at home: None  OCCUPATION: N/A, [employer] classes, spin bike, light weights, cardio, HIIT  PLOF: Independent  PATIENT GOALS: Reduce pain, improve ROM, keep working out.  NEXT MD VISIT:   OBJECTIVE:   DIAGNOSTIC FINDINGS:  Lab and Radiology Results   X-ray images right ankle pelvis and lumbar spine obtained today personally and independently interpreted.   Lumbar spine: Minimal facet DJD L5-S1.  Otherwise normal-appearing lumbar spine.  No acute fractures are present.  No AVN present.  No acute fractures.   Pelvis: Trace ossific fragments present at acetabular rim bilateral hips.  This could represent labrum tears.  No significant hip DJD present.   Right ankle: No acute fractures.  No significant degenerative changes  PATIENT SURVEYS:  FOTO 65  SCREENING FOR RED FLAGS: Bowel or bladder incontinence: No Spinal tumors: No Cauda equina syndrome: No Compression fracture: No Abdominal aneurysm: No  COGNITION: Overall cognitive status: Within functional limits for tasks assessed     SENSATION: Light touch: Impaired   MUSCLE LENGTH: Hamstrings: Right 90+ deg; Left 90+ deg Thomas test: WNL B  POSTURE: decreased lumbar lordosis, decreased thoracic kyphosis, and left pelvic obliquity  PALPATION: R LE 1/2" shorter than L. Mildly TTP R ITB. B proximal adductors tight.  LUMBAR ROM:   AROM eval  Flexion 100%  Extension 100  Right lateral flexion 100  Left lateral  flexion 100  Right rotation 100  Left rotation 100   (Blank rows = not tested)  LOWER EXTREMITY ROM:   WNL throughout   LOWER EXTREMITY MMT:  All MMT was at least 4+/5 with the exceptions listed below.  MMT Right eval Left eval  Hip flexion    Hip extension 3+ 3+  Hip abduction 3+ 3+  Hip adduction    Hip internal rotation 3 4  Hip external rotation 3 3  Knee flexion    Knee extension    Ankle dorsiflexion    Ankle plantarflexion    Ankle inversion    Ankle eversion     (Blank rows = not tested)  LUMBAR SPECIAL TESTS:  Straight leg raise test: Negative  LE TESTS: Flexion IR, axial compression test-(-) B  GAIT: Distance walked: 39' in clinic Assistive device utilized: None Level of assistance: Complete Independence Comments: No obvious abnormalities  TODAY'S TREATMENT:                                                                                                                              DATE: 07/16/22 Nu-Step L5 x 6 minutes Supine with physioball strengthening-Bridge, bridge with IR, bridge with ball roll side to side, bridge with knee flexion 10 each Supine on elbows, Maintain pelvic tilt while lifting knees to chest straight, then to R, then L. 2 x 5 Prone and side planks x 20 sec Standing shoulder ext, rows, ER against G tband, 2 x 10 of each.  14-Jul-2022 NuStep L5 x78mns  Dead bugs x10 Bridge & [id number] each x5  SL bridge x10 each  Yellow ball elbow to knee crunches x10 each  Russian twists 2x10 w/yellow ball  Resisted trunk rotations blueTB x10 each side  Birddogs x10 AR press 15# 2x10  Rows and lats 20# 2x10 BlackTB ext 2x10    06/28/22 Leg ext 10# 2x10 HS curls 20# 2x10 Leg press 20# x10, 30# x10 Step ups 10"  Bridges with ball squeeze 2x10  Clamshells SL 2x10  Lateral band walks  S2S yellow ball OHP 2x10  Double leg lifts 2x10  AR press 10# 2x10 blackTB crunches 2x10  06/25/2022 L1.5 x 2 min each Bike L2.5 x 3 min 20lb resisted  gait 4 way x 4 each  Hip Ext & Abd 5lb x10 each  Hip bridges LE on Pball Bridges, K2C, Oblq Dead bugs LE only  Bridge & 21-Nov-2022 x10 each S2S OHP yellow ball 2x10   PATIENT EDUCATION:  Education details: POC Person educated: Patient Education method: Explanation Education comprehension: verbalized understanding  HOME EXERCISE PROGRAM: Access Code: [email or web address] URL: [email or web address] Date: 06/25/2022 Prepared by: Cheri Fowler  Exercises - Marching Bridge  - 1 x daily - 7 x weekly - 3 sets - 10 reps - Supine Dead Bug with Leg Extension  - 1 x daily - 7 x weekly - 3 sets - 10 reps - Squat  - 1 x daily - 7 x weekly - 3 sets - 10 reps  ASSESSMENT:  CLINICAL IMPRESSION: Pt reports much improved pain and she feels she is getting stronger. Updated HEP with additional exercises for trunk stability and Lower body strength. She returns to Dr on [place].  OBJECTIVE IMPAIRMENTS: decreased balance, decreased coordination, decreased endurance, decreased ROM, decreased strength, increased muscle spasms, improper body mechanics, postural dysfunction, and pain.   ACTIVITY LIMITATIONS: lifting, bending, squatting, and locomotion level  PARTICIPATION LIMITATIONS: community activity  PERSONAL FACTORS: Behavior pattern and Past/current experiences are also affecting patient's functional outcome.   REHAB POTENTIAL: Good  CLINICAL DECISION MAKING: Stable/uncomplicated  EVALUATION COMPLEXITY: Moderate   GOALS: Goals reviewed with patient? Yes  SHORT TERM GOALS: Target date: 07/18/2022  I with basic HEP Baseline: Goal status: met  LONG TERM GOALS: Target date: 08/29/2022  I with final HEP Baseline:  Goal status: ongoing  2.  Patient will achieve at least a 71 on FOTO. Baseline: 65 Goal status: INITIAL  3.  Patient will be able to perform her normal workout activities with pain, tingling, numbness of <3/10 Baseline: 5 Goal status: met  4.  Increase B hip strength  to at least 4/5 Baseline: 3-(3+)/5 Goal status: INITIAL PLAN:  PT FREQUENCY: 2x/week  PT DURATION: 10 weeks  PLANNED INTERVENTIONS: Therapeutic exercises, Therapeutic activity, Neuromuscular re-education, Balance training, Gait training, Patient/Family education, Self Care, Joint mobilization, Dry Needling, Electrical stimulation, Cryotherapy, Moist heat, Taping, Ionotophoresis 18m/ml Dexamethasone, and Manual therapy.  PLAN FOR NEXT SESSION: 12/6-Re-assess status, possible D/C if appropriate.  SEthel RanaDPT 07/16/22 1:56 PM  07/16/2022, 1:56 PM

## 2022-07-16 NOTE — Progress Notes (Deleted)
   I, Philbert Riser, LAT, ATC acting as a scribe for [name], MD.  Marie Hanson is a 41 y.o. female who presents to [facility] at [facility] today for f/u left foot, right ankle, and right hip pain.  Patient was last seen by Dr. Denyse Amass on 06/18/2022 and was referred to PT, completing 5 visits.  Today, patient reports  Dx imaging: 06/18/2022 R ankle, pelvis, & L-spine x-ray 01/16/20 C-spine MRI   Pertinent review of systems: ***  Relevant historical information: ***   Exam:  LMP 05/25/2022  General: Well Developed, well nourished, and in no acute distress.   MSK: ***    Lab and Radiology Results No results found for this or any previous visit (from the past 72 hour(s)). No results found.     Assessment and Plan: 41 y.o. female with ***   PDMP not reviewed this encounter. No orders of the defined types were placed in this encounter.  No orders of the defined types were placed in this encounter.    Discussed warning signs or symptoms. Please see discharge instructions. Patient expresses understanding.   ***

## 2022-07-17 ENCOUNTER — Ambulatory Visit: Payer: Managed Care, Other (non HMO) | Admitting: Family Medicine

## 2022-07-18 ENCOUNTER — Ambulatory Visit: Payer: Managed Care, Other (non HMO) | Admitting: Physical Therapy

## 2022-07-24 NOTE — Progress Notes (Unsigned)
   I, Philbert Riser, LAT, ATC acting as a scribe for [name], MD.  Marie Hanson is a 41 y.o. female who presents to [facility] at [facility] today for f/u left foot, right ankle, and right hip pain.  Patient was last seen by Dr. Denyse Amass on 06/18/2022 and was referred to PT, completing 5 visits.  Today, patient reports  Dx imaging: 06/18/2022 R ankle, pelvis, & L-spine x-ray 01/16/20 C-spine MRI    Pertinent review of systems: ***  Relevant historical information: ***   Exam:  There were no vitals taken for this visit. General: Well Developed, well nourished, and in no acute distress.   MSK: ***    Lab and Radiology Results No results found for this or any previous visit (from the past 72 hour(s)). No results found.     Assessment and Plan: 41 y.o. female with ***   PDMP not reviewed this encounter. No orders of the defined types were placed in this encounter.  No orders of the defined types were placed in this encounter.    Discussed warning signs or symptoms. Please see discharge instructions. Patient expresses understanding.   ***

## 2022-07-25 ENCOUNTER — Ambulatory Visit: Payer: Managed Care, Other (non HMO) | Admitting: Family Medicine

## 2022-07-25 ENCOUNTER — Ambulatory Visit: Payer: Self-pay

## 2022-07-25 ENCOUNTER — Ambulatory Visit: Payer: Managed Care, Other (non HMO)

## 2022-07-25 VITALS — BP 124/86 | HR 52 | Ht 64.0 in | Wt 109.0 lb

## 2022-07-25 DIAGNOSIS — G8929 Other chronic pain: Secondary | ICD-10-CM | POA: Diagnosis not present

## 2022-07-25 DIAGNOSIS — M542 Cervicalgia: Secondary | ICD-10-CM | POA: Diagnosis not present

## 2022-07-25 DIAGNOSIS — M25512 Pain in left shoulder: Secondary | ICD-10-CM

## 2022-07-25 NOTE — Patient Instructions (Signed)
Thank you for coming in today.   Please get an Xray today before you leave   I've referred you to Physical Therapy.  Let us know if you don't hear from them in one week.   Keep me updated.   I think you will do well.   Recheck in 6 weeks.

## 2022-07-27 NOTE — Progress Notes (Signed)
X-ray images cervical spine show some evidence of muscle spasm.  No fractures or severe arthritis are visible.

## 2022-07-31 ENCOUNTER — Ambulatory Visit: Payer: Managed Care, Other (non HMO)

## 2022-07-31 DIAGNOSIS — M5416 Radiculopathy, lumbar region: Secondary | ICD-10-CM | POA: Diagnosis not present

## 2022-07-31 DIAGNOSIS — M7550 Bursitis of unspecified shoulder: Secondary | ICD-10-CM

## 2022-07-31 DIAGNOSIS — G8929 Other chronic pain: Secondary | ICD-10-CM

## 2022-07-31 DIAGNOSIS — M542 Cervicalgia: Secondary | ICD-10-CM

## 2022-07-31 NOTE — Therapy (Signed)
OUTPATIENT PHYSICAL THERAPY SHOULDER EVALUATION   Patient Name: Marie Hanson MRN: [id number] DOB:11-09-1980, 41 y.o., female Today's Date: 07/31/2022  END OF SESSION:  PT End of Session - 07/31/22 1501     Visit Number 1    Date for PT Re-Evaluation 09/25/22    PT Start Time 1500    PT Stop Time 1540    PT Time Calculation (min) 40 min    Activity Tolerance Patient tolerated treatment well    Behavior During Therapy [facility] for tasks assessed/performed             Past Medical History:  Diagnosis Date   Anxiety    Depression    Nipple pain x's 2 months   Rosacea    Past Surgical History:  Procedure Laterality Date   AUGMENTATION MAMMAPLASTY     breast enhancement & removal   Patient Active Problem List   Diagnosis Date Noted   Raynaud's phenomenon 06/18/2022   Rosacea 03/05/2019   Anxiety 03/05/2019    PCP: Philip Aspen  REFERRING PROVIDER: Earma Reading  REFERRING DIAG: J94.174, G89.29, M54.2  THERAPY DIAG:  Cervicalgia  Chronic left shoulder pain  Bursitis and tendinitis of shoulder region  Rationale for Evaluation and Treatment: Rehabilitation  ONSET DATE: 07/25/22  SUBJECTIVE:                                                                                                                                                                                      SUBJECTIVE STATEMENT: I am good I think the hip is pretty much resolved. I was skeptical about PT before but it worked so well so I asked my doctor to send me here for my neck issues.   PERTINENT HISTORY:   "Pt now is experiencing L shoulder and neck pain. This is a chronic issue that has recently worsened. Pt locates pain to the L-side of her neck, L trapz, and L shoulder."  PAIN:  Are you having pain? Yes: NPRS scale: 2/10 Pain location: L upper trap and shoulder Pain description: dull, sometimes it feels like a shooting, headaches Aggravating factors: working my arms too much,  push ups Relieving factors: stretching, theragun  PRECAUTIONS: None  WEIGHT BEARING RESTRICTIONS: No  FALLS:  Has patient fallen in last 6 months? No  LIVING ENVIRONMENT: Lives with: lives with their family Lives in: House/apartment Stairs: Yes: Internal: 14 steps; on left going up Has following equipment at home: None  OCCUPATION: N/A  PLOF: Independent  PATIENT GOALS:strengthening my neck and scapula area  NEXT MD VISIT:   OBJECTIVE:   DIAGNOSTIC FINDINGS:  IMPRESSION: Negative cervical spine radiographs.  Diagnostic Limited MSK Ultrasound of:  Left shoulder Biceps tendon intact normal-appearing Pectoralis muscle normal-appearing Subscapularis tendon normal-appearing Supraspinatus tendon is intact.  Moderate subacromial bursitis is present. Infraspinatus tendon is normal-appearing AC joint normal-appearing Impression: Subacromial bursitis  PATIENT SURVEYS:  FOTO 76  COGNITION: Overall cognitive status: Within functional limits for tasks assessed     SENSATION: WFL  POSTURE: Rounded shoulders  UPPER EXTREMITY ROM: grossly WFL   UPPER EXTREMITY MMT:  MMT Right eval Left eval  Shoulder flexion 4+ 4+  Shoulder extension    Shoulder abduction 4 3+  Shoulder adduction    Shoulder internal rotation 4 4  Shoulder external rotation 4 4  Middle trapezius    Lower trapezius    Elbow flexion 5 5  Elbow extension 5 5  Wrist flexion    Wrist extension    Wrist ulnar deviation    Wrist radial deviation    Wrist pronation    Wrist supination    Grip strength (lbs)    (Blank rows = not tested)  SHOULDER SPECIAL TESTS: Impingement tests: Neer impingement test: negative, Hawkins/Kennedy impingement test: positive , and Painful arc test: negative   PALPATION:  Tightness in L upper trap some trigger points noted    TODAY'S TREATMENT:                                                                                                                                          DATE: 07/31/22-EVAL   PATIENT EDUCATION: Education details: HEP, POC, dry needling Person educated: Patient Education method: Explanation Education comprehension: verbalized understanding  HOME EXERCISE PROGRAM: Access Code: [email or web address] URL: [email or web address] Date: 07/31/2022 Prepared by: Cassie Freer  Exercises - Doorway Pec Stretch at 90 Degrees Abduction  - 1 x daily - 7 x weekly - 30 hold - Seated Upper Trapezius Stretch  - 1 x daily - 7 x weekly - 30 hold - Seated Levator Scapulae Stretch  - 1 x daily - 7 x weekly - 30 hold  ASSESSMENT:  CLINICAL IMPRESSION: Patient is a 41 y.o. female who was seen today for physical therapy evaluation and treatment for neck and shoulder pain. She presents with some tightness and trigger points in her left upper trap. She has good strength and ROM overall. Patient will benefit from skilled PT to address her impairments to decrease her pain and increase her flexibility in her neck.   OBJECTIVE IMPAIRMENTS: increased fascial restrictions and pain.   REHAB POTENTIAL: Good  CLINICAL DECISION MAKING: Stable/uncomplicated  EVALUATION COMPLEXITY: Low  GOALS: Goals reviewed with patient? Yes  SHORT TERM GOALS: Target date: 08/28/22  Patient will be independent with initial HEP.  Goal status: INITIAL    LONG TERM GOALS: Target date: 09/25/22 Patient will be independent with advanced/ongoing HEP to improve outcomes and carryover.  Goal status: INITIAL  2.  Patient will report 75% improvement in neck and R shoulder pain to improve QOL.  Baseline:  2/10 Goal status: INITIAL  3.  Patient will demonstrate improved posture to decrease muscle imbalance. Baseline: UT tightness Goal status: INITIAL    PLAN:  PT FREQUENCY: 1x/week  PT DURATION: 8 weeks  PLANNED INTERVENTIONS: Therapeutic exercises, Therapeutic activity, Neuromuscular re-education, Balance training, Gait training, Patient/Family education,  Self Care, Joint mobilization, Dry Needling, Electrical stimulation, Cryotherapy, Moist heat, Traction, Ionotophoresis 4mg /ml Dexamethasone, and Manual therapy  PLAN FOR NEXT SESSION: UBE, neck stretching and strengthening, dry needling to UT and rhomboids if is here   Kathlene November, PT 07/31/2022, 3:46 PM

## 2022-08-01 ENCOUNTER — Telehealth: Payer: Managed Care, Other (non HMO) | Admitting: Internal Medicine

## 2022-08-02 ENCOUNTER — Encounter: Payer: Self-pay | Admitting: Internal Medicine

## 2022-08-02 ENCOUNTER — Telehealth (INDEPENDENT_AMBULATORY_CARE_PROVIDER_SITE_OTHER): Payer: Managed Care, Other (non HMO) | Admitting: Internal Medicine

## 2022-08-02 VITALS — Wt 115.0 lb

## 2022-08-02 DIAGNOSIS — R232 Flushing: Secondary | ICD-10-CM | POA: Diagnosis not present

## 2022-08-02 DIAGNOSIS — F339 Major depressive disorder, recurrent, unspecified: Secondary | ICD-10-CM | POA: Diagnosis not present

## 2022-08-02 DIAGNOSIS — J302 Other seasonal allergic rhinitis: Secondary | ICD-10-CM | POA: Diagnosis not present

## 2022-08-02 MED ORDER — AZELASTINE HCL 0.1 % NA SOLN: 2.0000 | Freq: Two times a day (BID) | NASAL | 12 refills | Status: DC

## 2022-08-02 MED ORDER — BUPROPION HCL ER (XL) 150 MG PO TB24: 150.0000 mg | ORAL_TABLET | Freq: Every day | ORAL | 1 refills | Status: DC

## 2022-08-02 NOTE — Progress Notes (Signed)
Virtual Visit via Video Note  I connected with Marie Hanson on 08/02/22 at 11:00 AM EST by a video enabled telemedicine application and verified that I am speaking with the correct person using two identifiers.  Location patient: home Location provider: work office Persons participating in the virtual visit: patient, provider  I discussed the limitations of evaluation and management by telemedicine and the availability of in person appointments. The patient expressed understanding and agreed to proceed.   HPI: She has scheduled this visit for 3 main purposes:  1.  She has been having hot flashes.  Because she is on OCPs she has not been able to gauge her menstrual cycles.  Her mother does not recall going through early menopause.  2.  She is requesting a refill of azelastine nasal spray.  3.  She has a history of depression.  She is wanting to go back on an antidepressant.  She did not feel well on Lexapro or sertraline.  She did well in the past on Wellbutrin.   ROS: Constitutional: Denies fever, chills, diaphoresis, appetite change. HEENT: Denies photophobia, eye pain, redness, hearing loss, ear pain, congestion, sore throat, rhinorrhea, sneezing, mouth sores, trouble swallowing, neck pain, neck stiffness and tinnitus.   Respiratory: Denies SOB, DOE, cough, chest tightness,  and wheezing.   Cardiovascular: Denies chest pain, palpitations and leg swelling.  Gastrointestinal: Denies nausea, vomiting, abdominal pain, diarrhea, constipation, blood in stool and abdominal distention.  Genitourinary: Denies dysuria, urgency, frequency, hematuria, flank pain and difficulty urinating.  Endocrine: Denies: hot or cold intolerance, sweats, changes in hair or nails, polyuria, polydipsia. Musculoskeletal: Denies myalgias, back pain, joint swelling, arthralgias and gait problem.  Skin: Denies pallor, rash and wound.  Neurological: Denies dizziness, seizures, syncope, weakness,  light-headedness, numbness and headaches.  Hematological: Denies adenopathy. Easy bruising, personal or family bleeding history  Psychiatric/Behavioral: Denies suicidal ideation,  confusion,  and agitation   Past Medical History:  Diagnosis Date   Anxiety    Depression    Nipple pain x's 2 months   Rosacea     Past Surgical History:  Procedure Laterality Date   AUGMENTATION MAMMAPLASTY     breast enhancement & removal    Family History  Problem Relation Age of Onset   Diabetes Mother    Depression Father    Anxiety disorder Father     SOCIAL HX:   reports that she has never smoked. She has never used smokeless tobacco. She reports that she does not currently use alcohol. She reports that she does not use drugs.   Current Outpatient Medications:    Azelaic Acid 15 % gel, AFTER SKIN IS THOROUGHLY WASHED AND PATTED DRY, GENTLY AND THOROUGHLY APPLY A THIN FILM TO AFFECTED AREA IN THE MORNING AND AT NIGHT, Disp: 50 g, Rfl: 2   azelastine (ASTELIN) 0.1 % nasal spray, Place 2 sprays into both nostrils 2 (two) times daily. Use in each nostril as directed, Disp: 30 mL, Rfl: 12   buPROPion (WELLBUTRIN XL) 150 MG 24 hr tablet, Take 1 tablet (150 mg total) by mouth daily., Disp: 90 tablet, Rfl: 1   doxycycline (VIBRAMYCIN) 100 MG capsule, Take 50 mg by mouth daily., Disp: , Rfl:    mometasone (NASONEX) 50 MCG/ACT nasal spray, Place 2 sprays into the nose daily., Disp: , Rfl:    Multiple Vitamin (MULTIVITAMIN) tablet, Take 1 tablet by mouth daily., Disp: , Rfl:    [name] 3-0.02 MG tablet, TAKE 1 TABLET BY MOUTH EVERY DAY,  Disp: 84 tablet, Rfl: 1   Omega-3 Fatty Acids (FISH OIL) 1000 MG CAPS, Take by mouth., Disp: , Rfl:    RESTASIS 0.05 % ophthalmic emulsion, , Disp: , Rfl:   EXAM:   VITALS per patient if applicable: None reported  GENERAL: alert, oriented, appears well and in no acute distress  HEENT: atraumatic, conjunttiva clear, no obvious abnormalities on inspection of external  nose and ears  NECK: normal movements of the head and neck  LUNGS: on inspection no signs of respiratory distress, breathing rate appears normal, no obvious gross increased work of breathing, gasping or wheezing  CV: no obvious cyanosis  MS: moves all visible extremities without noticeable abnormality  PSYCH/NEURO: pleasant and cooperative, no obvious depression or anxiety, speech and thought processing grossly intact  ASSESSMENT AND PLAN:   Hot flashes  Seasonal allergies - Plan: azelastine (ASTELIN) 0.1 % nasal spray  Depression, recurrent (HCC) - Plan: buPROPion (WELLBUTRIN XL) 150 MG 24 hr tablet  -Because she is on OCPs, FSH measurements will be inaccurate as we are suppressing the hypothalamic pituitary axis.  She has an appointment scheduled with GYN in January.  I have asked her to discuss with them next steps.  If she is truly going through early menopause, she would be at increased risk of bone loss and cardiovascular disease so starting hormone replacement therapy might be beneficial. -Azelastine nasal spray refilled. -After discussion, we have decided to start Wellbutrin 150 mg daily for treatment of depression.   I discussed the assessment and treatment plan with the patient. The patient was provided an opportunity to ask questions and all were answered. The patient agreed with the plan and demonstrated an understanding of the instructions.   The patient was advised to call back or seek an in-person evaluation if the symptoms worsen or if the condition fails to improve as anticipated.    Marie Jan, MD  [facility]

## 2022-08-09 ENCOUNTER — Ambulatory Visit: Payer: Managed Care, Other (non HMO)

## 2022-08-09 DIAGNOSIS — M5416 Radiculopathy, lumbar region: Secondary | ICD-10-CM | POA: Diagnosis not present

## 2022-08-09 DIAGNOSIS — M7590 Shoulder lesion, unspecified, unspecified shoulder: Secondary | ICD-10-CM

## 2022-08-09 DIAGNOSIS — G8929 Other chronic pain: Secondary | ICD-10-CM

## 2022-08-09 DIAGNOSIS — M6281 Muscle weakness (generalized): Secondary | ICD-10-CM

## 2022-08-09 DIAGNOSIS — M542 Cervicalgia: Secondary | ICD-10-CM

## 2022-08-09 NOTE — Therapy (Signed)
OUTPATIENT PHYSICAL THERAPY SHOULDER TREATMENT   Patient Name: Marie Hanson MRN: [id number] DOB:11-26-1980, 41 y.o., female Today's Date: 08/09/2022  END OF SESSION:  PT End of Session - 08/09/22 1228     Visit Number 2    Date for PT Re-Evaluation 09/25/22    PT Start Time 1228    PT Stop Time 1310    PT Time Calculation (min) 42 min    Activity Tolerance Patient tolerated treatment well    Behavior During Therapy WFL for tasks assessed/performed             Past Medical History:  Diagnosis Date   Anxiety    Depression    Nipple pain x's 2 months   Rosacea    Past Surgical History:  Procedure Laterality Date   AUGMENTATION MAMMAPLASTY     breast enhancement & removal   Patient Active Problem List   Diagnosis Date Noted   Raynaud's phenomenon 06/18/2022   Rosacea 03/05/2019   Anxiety 03/05/2019    PCP: Philip Aspen  REFERRING PROVIDER: Earma Reading  REFERRING DIAG: D17.616, G89.29, M54.2  THERAPY DIAG:  Cervicalgia  Chronic left shoulder pain  Bursitis and tendinitis of shoulder region  Muscle weakness (generalized)  Rationale for Evaluation and Treatment: Rehabilitation  ONSET DATE: 07/25/22  SUBJECTIVE:                                                                                                                                                                                      SUBJECTIVE STATEMENT: The left side is still very tight and stiff, it's like a 2/10  PERTINENT HISTORY:   "Pt now is experiencing L shoulder and neck pain. This is a chronic issue that has recently worsened. Pt locates pain to the L-side of her neck, L trapz, and L shoulder."  PAIN:  Are you having pain? Yes: NPRS scale: 2/10 Pain location: L upper trap and shoulder Pain description: dull, sometimes it feels like a shooting, headaches Aggravating factors: working my arms too much, push ups Relieving factors: stretching, theragun  PRECAUTIONS:  None  WEIGHT BEARING RESTRICTIONS: No  FALLS:  Has patient fallen in last 6 months? No  LIVING ENVIRONMENT: Lives with: lives with their family Lives in: House/apartment Stairs: Yes: Internal: 14 steps; on left going up Has following equipment at home: None  OCCUPATION: N/A  PLOF: Independent  PATIENT GOALS:strengthening my neck and scapula area  NEXT MD VISIT:   OBJECTIVE:   DIAGNOSTIC FINDINGS:  IMPRESSION: Negative cervical spine radiographs.  Diagnostic Limited MSK Ultrasound of: Left shoulder Biceps tendon intact normal-appearing Pectoralis muscle normal-appearing Subscapularis tendon normal-appearing Supraspinatus tendon is intact.  Moderate subacromial  bursitis is present. Infraspinatus tendon is normal-appearing AC joint normal-appearing Impression: Subacromial bursitis  PATIENT SURVEYS:  FOTO 76  COGNITION: Overall cognitive status: Within functional limits for tasks assessed     SENSATION: WFL  POSTURE: Rounded shoulders  UPPER EXTREMITY ROM: grossly WFL   UPPER EXTREMITY MMT:  MMT Right eval Left eval  Shoulder flexion 4+ 4+  Shoulder extension    Shoulder abduction 4 3+  Shoulder adduction    Shoulder internal rotation 4 4  Shoulder external rotation 4 4  Middle trapezius    Lower trapezius    Elbow flexion 5 5  Elbow extension 5 5  Wrist flexion    Wrist extension    Wrist ulnar deviation    Wrist radial deviation    Wrist pronation    Wrist supination    Grip strength (lbs)    (Blank rows = not tested)  SHOULDER SPECIAL TESTS: Impingement tests: Neer impingement test: negative, Hawkins/Kennedy impingement test: positive , and Painful arc test: negative   PALPATION:  Tightness in L upper trap some trigger points noted    TODAY'S TREATMENT:                                                                                                                                         DATE:  08/09/22 UBE L3 x3 mins each way Rows  and Lats 25# 2x10 Shoulder ext 5# 2x10 Horizontal abd greenTB 2x10 DN to bilateral upper traps followed by MH for  07/31/22-EVAL   PATIENT EDUCATION: Education details: HEP, POC, dry needling Person educated: Patient Education method: Explanation Education comprehension: verbalized understanding  HOME EXERCISE PROGRAM: Access Code: [email or web address] URL: [email or web address] Date: 07/31/2022 Prepared by: Cassie Freer  Exercises - Doorway Pec Stretch at 90 Degrees Abduction  - 1 x daily - 7 x weekly - 30 hold - Seated Upper Trapezius Stretch  - 1 x daily - 7 x weekly - 30 hold - Seated Levator Scapulae Stretch  - 1 x daily - 7 x weekly - 30 hold  ASSESSMENT:  CLINICAL IMPRESSION: Patient returns with some complaints of tightness in L upper trap. We started some light strengthening to UE. Did dry needling today to bilaterally upper traps and followed with moist heat.   OBJECTIVE IMPAIRMENTS: increased fascial restrictions and pain.   REHAB POTENTIAL: Good  CLINICAL DECISION MAKING: Stable/uncomplicated  EVALUATION COMPLEXITY: Low  GOALS: Goals reviewed with patient? Yes  SHORT TERM GOALS: Target date: 08/28/22  Patient will be independent with initial HEP.  Goal status: INITIAL    LONG TERM GOALS: Target date: 09/25/22 Patient will be independent with advanced/ongoing HEP to improve outcomes and carryover.  Goal status: INITIAL  2.  Patient will report 75% improvement in neck and R shoulder pain to improve QOL.  Baseline: 2/10 Goal status: INITIAL  3.  Patient will demonstrate improved posture to decrease muscle imbalance. Baseline: UT  tightness Goal status: INITIAL    PLAN:  PT FREQUENCY: 1x/week  PT DURATION: 8 weeks  PLANNED INTERVENTIONS: Therapeutic exercises, Therapeutic activity, Neuromuscular re-education, Balance training, Gait training, Patient/Family education, Self Care, Joint mobilization, Dry Needling, Electrical stimulation,  Cryotherapy, Moist heat, Traction, Ionotophoresis 4mg /ml Dexamethasone, and Manual therapy  PLAN FOR NEXT SESSION: UBE, neck stretching and strengthening, dry needling to UT and rhomboids if is here   Kathlene November, PT 08/09/2022, 1:15 PM

## 2022-08-10 ENCOUNTER — Telehealth: Payer: Managed Care, Other (non HMO)

## 2022-08-10 NOTE — Therapy (Signed)
OUTPATIENT PHYSICAL THERAPY SHOULDER TREATMENT   Patient Name: Marie Hanson MRN: [id number] DOB:Apr 11, 1981, 41 y.o., female Today's Date: 08/14/2022  END OF SESSION:  PT End of Session - 08/14/22 1307     Visit Number 3    Date for PT Re-Evaluation 09/25/22    PT Start Time 1307    PT Stop Time 1350    PT Time Calculation (min) 43 min    Activity Tolerance Patient tolerated treatment well    Behavior During Therapy [facility] for tasks assessed/performed             Past Medical History:  Diagnosis Date   Anxiety    Depression    Nipple pain x's 2 months   Rosacea    Past Surgical History:  Procedure Laterality Date   AUGMENTATION MAMMAPLASTY     breast enhancement & removal   Patient Active Problem List   Diagnosis Date Noted   Raynaud's phenomenon 06/18/2022   Rosacea 03/05/2019   Anxiety 03/05/2019    PCP: Philip Aspen  REFERRING PROVIDER: Earma Reading  REFERRING DIAG: L87.564, G89.29, M54.2  THERAPY DIAG:  Cervicalgia  Muscle weakness (generalized)  Bursitis and tendinitis of shoulder region  Rationale for Evaluation and Treatment: Rehabilitation  ONSET DATE: 07/25/22  SUBJECTIVE:                                                                                                                                                                                      SUBJECTIVE STATEMENT: Feeling pretty good, the dry needling helped it might be a little tight again. No pain!   PERTINENT HISTORY:   "Pt now is experiencing L shoulder and neck pain. This is a chronic issue that has recently worsened. Pt locates pain to the L-side of her neck, L trapz, and L shoulder."  PAIN:  Are you having pain? Yes: NPRS scale: 0/10 Pain location: L upper trap and shoulder Pain description: dull, sometimes it feels like a shooting, headaches Aggravating factors: working my arms too much, push ups Relieving factors: stretching, theragun  PRECAUTIONS:  None  WEIGHT BEARING RESTRICTIONS: No  FALLS:  Has patient fallen in last 6 months? No  LIVING ENVIRONMENT: Lives with: lives with their family Lives in: House/apartment Stairs: Yes: Internal: 14 steps; on left going up Has following equipment at home: None  OCCUPATION: N/A  PLOF: Independent  PATIENT GOALS:strengthening my neck and scapula area  NEXT MD VISIT:   OBJECTIVE:   DIAGNOSTIC FINDINGS:  IMPRESSION: Negative cervical spine radiographs.  Diagnostic Limited MSK Ultrasound of: Left shoulder Biceps tendon intact normal-appearing Pectoralis muscle normal-appearing Subscapularis tendon normal-appearing Supraspinatus tendon is intact.  Moderate subacromial bursitis  is present. Infraspinatus tendon is normal-appearing AC joint normal-appearing Impression: Subacromial bursitis  PATIENT SURVEYS:  FOTO 76  COGNITION: Overall cognitive status: Within functional limits for tasks assessed     SENSATION: WFL  POSTURE: Rounded shoulders  UPPER EXTREMITY ROM: grossly WFL   UPPER EXTREMITY MMT:  MMT Right eval Left eval  Shoulder flexion 4+ 4+  Shoulder extension    Shoulder abduction 4 3+  Shoulder adduction    Shoulder internal rotation 4 4  Shoulder external rotation 4 4  Middle trapezius    Lower trapezius    Elbow flexion 5 5  Elbow extension 5 5  Wrist flexion    Wrist extension    Wrist ulnar deviation    Wrist radial deviation    Wrist pronation    Wrist supination    Grip strength (lbs)    (Blank rows = not tested)  SHOULDER SPECIAL TESTS: Impingement tests: Neer impingement test: negative, Hawkins/Kennedy impingement test: positive , and Painful arc test: negative   PALPATION:  Tightness in L upper trap some trigger points noted    TODAY'S TREATMENT:                                                                                                                                         DATE:  08/14/22 UBE L3 x3 mins each  way Shoulder ext 5# 2x10 Cable rows 10# 2x10 IYT 2# 2x10 Shrugs 4# 2x10 UT stretch with 4# 30s  Chin tucks red band 2x10 OHP yellow ball 2x10 STM with gun to upper traps and c-spine mm  08/09/22 UBE L3 x3 mins each way Rows and Lats 25# 2x10 Shoulder ext 5# 2x10 Horizontal abd greenTB 2x10 DN to bilateral upper traps followed by MH for  07/31/22-EVAL   PATIENT EDUCATION: Education details: HEP, POC, dry needling Person educated: Patient Education method: Explanation Education comprehension: verbalized understanding  HOME EXERCISE PROGRAM: Access Code: [email or web address] URL: [email or web address] Date: 07/31/2022 Prepared by: Cassie Freer  Exercises - Doorway Pec Stretch at 90 Degrees Abduction  - 1 x daily - 7 x weekly - 30 hold - Seated Upper Trapezius Stretch  - 1 x daily - 7 x weekly - 30 hold - Seated Levator Scapulae Stretch  - 1 x daily - 7 x weekly - 30 hold  ASSESSMENT:  CLINICAL IMPRESSION: Patient returns with no pain and states dry needling really helped last time. She has some tightness but overall has made good improvements. Continued with some UE and shoulder strengthening. Does well with all interventions.   OBJECTIVE IMPAIRMENTS: increased fascial restrictions and pain.   REHAB POTENTIAL: Good  CLINICAL DECISION MAKING: Stable/uncomplicated  EVALUATION COMPLEXITY: Low  GOALS: Goals reviewed with patient? Yes  SHORT TERM GOALS: Target date: 08/28/22  Patient will be independent with initial HEP.  Goal status: INITIAL    LONG TERM GOALS: Target date: 09/25/22 Patient will be independent  with advanced/ongoing HEP to improve outcomes and carryover.  Goal status: INITIAL  2.  Patient will report 75% improvement in neck and R shoulder pain to improve QOL.  Baseline: 2/10 Goal status: INITIAL  3.  Patient will demonstrate improved posture to decrease muscle imbalance. Baseline: UT tightness Goal status: INITIAL    PLAN:  PT  FREQUENCY: 1x/week  PT DURATION: 8 weeks  PLANNED INTERVENTIONS: Therapeutic exercises, Therapeutic activity, Neuromuscular re-education, Balance training, Gait training, Patient/Family education, Self Care, Joint mobilization, Dry Needling, Electrical stimulation, Cryotherapy, Moist heat, Traction, Ionotophoresis 4mg /ml Dexamethasone, and Manual therapy  PLAN FOR NEXT SESSION: UBE, neck stretching and strengthening, dry needling to UT and rhomboids if Kathlene November is here   [employer], PT 08/14/2022, 1:50 PM

## 2022-08-14 ENCOUNTER — Ambulatory Visit: Payer: Managed Care, Other (non HMO) | Attending: Family Medicine

## 2022-08-14 DIAGNOSIS — M7550 Bursitis of unspecified shoulder: Secondary | ICD-10-CM | POA: Diagnosis present

## 2022-08-14 DIAGNOSIS — M25512 Pain in left shoulder: Secondary | ICD-10-CM | POA: Insufficient documentation

## 2022-08-14 DIAGNOSIS — M542 Cervicalgia: Secondary | ICD-10-CM | POA: Diagnosis not present

## 2022-08-14 DIAGNOSIS — M7590 Shoulder lesion, unspecified, unspecified shoulder: Secondary | ICD-10-CM | POA: Diagnosis present

## 2022-08-14 DIAGNOSIS — G8929 Other chronic pain: Secondary | ICD-10-CM | POA: Diagnosis present

## 2022-08-14 DIAGNOSIS — M6281 Muscle weakness (generalized): Secondary | ICD-10-CM | POA: Insufficient documentation

## 2022-08-15 ENCOUNTER — Ambulatory Visit
Admission: RE | Admit: 2022-08-15 | Discharge: 2022-08-15 | Disposition: A | Discharge status: Home / Self Care | Payer: Managed Care, Other (non HMO) | Source: Ambulatory Visit | Attending: Internal Medicine | Admitting: Internal Medicine

## 2022-08-15 DIAGNOSIS — Z1239 Encounter for other screening for malignant neoplasm of breast: Secondary | ICD-10-CM

## 2022-08-21 NOTE — Therapy (Signed)
OUTPATIENT PHYSICAL THERAPY SHOULDER TREATMENT   Patient Name: Marie Hanson MRN: [id number] DOB:12-06-1980, 42 y.o., female Today's Date: 08/14/2022  END OF SESSION:  PT End of Session - 08/14/22 1307     Visit Number 3    Date for PT Re-Evaluation 09/25/22    PT Start Time 1307    PT Stop Time 1350    PT Time Calculation (min) 43 min    Activity Tolerance Patient tolerated treatment well    Behavior During Therapy [facility] for tasks assessed/performed             Past Medical History:  Diagnosis Date   Anxiety    Depression    Nipple pain x's 2 months   Rosacea    Past Surgical History:  Procedure Laterality Date   AUGMENTATION MAMMAPLASTY     breast enhancement & removal   Patient Active Problem List   Diagnosis Date Noted   Raynaud's phenomenon 06/18/2022   Rosacea 03/05/2019   Anxiety 03/05/2019    PCP: Philip Aspen  REFERRING PROVIDER: Earma Reading  REFERRING DIAG: Q25.956, G89.29, M54.2  THERAPY DIAG:  Cervicalgia  Muscle weakness (generalized)  Bursitis and tendinitis of shoulder region  Rationale for Evaluation and Treatment: Rehabilitation  ONSET DATE: 07/25/22  SUBJECTIVE:                                                                                                                                                                                      SUBJECTIVE STATEMENT: Feeling pretty good, the dry needling helped it might be a little tight again. No pain!   PERTINENT HISTORY:   "Pt now is experiencing L shoulder and neck pain. This is a chronic issue that has recently worsened. Pt locates pain to the L-side of her neck, L trapz, and L shoulder."  PAIN:  Are you having pain? Yes: NPRS scale: 0/10 Pain location: L upper trap and shoulder Pain description: dull, sometimes it feels like a shooting, headaches Aggravating factors: working my arms too much, push ups Relieving factors: stretching, theragun  PRECAUTIONS:  None  WEIGHT BEARING RESTRICTIONS: No  FALLS:  Has patient fallen in last 6 months? No  LIVING ENVIRONMENT: Lives with: lives with their family Lives in: House/apartment Stairs: Yes: Internal: 14 steps; on left going up Has following equipment at home: None  OCCUPATION: N/A  PLOF: Independent  PATIENT GOALS:strengthening my neck and scapula area  NEXT MD VISIT:   OBJECTIVE:   DIAGNOSTIC FINDINGS:  IMPRESSION: Negative cervical spine radiographs.  Diagnostic Limited MSK Ultrasound of: Left shoulder Biceps tendon intact normal-appearing Pectoralis muscle normal-appearing Subscapularis tendon normal-appearing Supraspinatus tendon is intact.  Moderate subacromial bursitis  is present. Infraspinatus tendon is normal-appearing AC joint normal-appearing Impression: Subacromial bursitis  PATIENT SURVEYS:  FOTO 76  COGNITION: Overall cognitive status: Within functional limits for tasks assessed     SENSATION: WFL  POSTURE: Rounded shoulders  UPPER EXTREMITY ROM: grossly WFL   UPPER EXTREMITY MMT:  MMT Right eval Left eval  Shoulder flexion 4+ 4+  Shoulder extension    Shoulder abduction 4 3+  Shoulder adduction    Shoulder internal rotation 4 4  Shoulder external rotation 4 4  Middle trapezius    Lower trapezius    Elbow flexion 5 5  Elbow extension 5 5  Wrist flexion    Wrist extension    Wrist ulnar deviation    Wrist radial deviation    Wrist pronation    Wrist supination    Grip strength (lbs)    (Blank rows = not tested)  SHOULDER SPECIAL TESTS: Impingement tests: Neer impingement test: negative, Hawkins/Kennedy impingement test: positive , and Painful arc test: negative   PALPATION:  Tightness in L upper trap some trigger points noted    TODAY'S TREATMENT:                                                                                                                                         DATE:  08/22/22 UBE  Diagonals red TB  2x10 Rows and Lats Shoulder ext  Pec stretch  Head against ball 3# flexion with WaTe 2x10 DN and MH   08/14/22 UBE L3 x3 mins each way Shoulder ext 5# 2x10 Cable rows 10# 2x10 IYT 2# 2x10 Shrugs 4# 2x10 UT stretch with 4# 30s  Chin tucks red band 2x10 OHP yellow ball 2x10 STM with gun to upper traps and c-spine mm  08/09/22 UBE L3 x3 mins each way Rows and Lats 25# 2x10 Shoulder ext 5# 2x10 Horizontal abd greenTB 2x10 DN to bilateral upper traps followed by MH for  07/31/22-EVAL   PATIENT EDUCATION: Education details: HEP, POC, dry needling Person educated: Patient Education method: Explanation Education comprehension: verbalized understanding  HOME EXERCISE PROGRAM: Access Code: [facility] URL: [email or web address] Date: 07/31/2022 Prepared by: Cassie Freer  Exercises - Doorway Pec Stretch at 90 Degrees Abduction  - 1 x daily - 7 x weekly - 30 hold - Seated Upper Trapezius Stretch  - 1 x daily - 7 x weekly - 30 hold - Seated Levator Scapulae Stretch  - 1 x daily - 7 x weekly - 30 hold  ASSESSMENT:  CLINICAL IMPRESSION: Patient returns with no pain and states dry needling really helped last time. She has some tightness but overall has made good improvements. Continued with some UE and shoulder strengthening. Does well with all interventions.   OBJECTIVE IMPAIRMENTS: increased fascial restrictions and pain.   REHAB POTENTIAL: Good  CLINICAL DECISION MAKING: Stable/uncomplicated  EVALUATION COMPLEXITY: Low  GOALS: Goals reviewed with patient? Yes  SHORT TERM  GOALS: Target date: 08/28/22  Patient will be independent with initial HEP.  Goal status: INITIAL    LONG TERM GOALS: Target date: 09/25/22 Patient will be independent with advanced/ongoing HEP to improve outcomes and carryover.  Goal status: INITIAL  2.  Patient will report 75% improvement in neck and R shoulder pain to improve QOL.  Baseline: 2/10 Goal status: INITIAL  3.   Patient will demonstrate improved posture to decrease muscle imbalance. Baseline: UT tightness Goal status: INITIAL    PLAN:  PT FREQUENCY: 1x/week  PT DURATION: 8 weeks  PLANNED INTERVENTIONS: Therapeutic exercises, Therapeutic activity, Neuromuscular re-education, Balance training, Gait training, Patient/Family education, Self Care, Joint mobilization, Dry Needling, Electrical stimulation, Cryotherapy, Moist heat, Traction, Ionotophoresis 4mg /ml Dexamethasone, and Manual therapy  PLAN FOR NEXT SESSION: UBE, neck stretching and strengthening, dry needling to UT and rhomboids if Ronalee Belts is here   [employer], PT 08/14/2022, 1:50 PM

## 2022-08-22 ENCOUNTER — Ambulatory Visit: Payer: Managed Care, Other (non HMO)

## 2022-08-22 DIAGNOSIS — M542 Cervicalgia: Secondary | ICD-10-CM | POA: Diagnosis not present

## 2022-08-22 DIAGNOSIS — M7550 Bursitis of unspecified shoulder: Secondary | ICD-10-CM

## 2022-08-22 DIAGNOSIS — M6281 Muscle weakness (generalized): Secondary | ICD-10-CM

## 2022-08-22 DIAGNOSIS — G8929 Other chronic pain: Secondary | ICD-10-CM

## 2022-08-27 NOTE — Therapy (Signed)
OUTPATIENT PHYSICAL THERAPY SHOULDER TREATMENT   Patient Name: Marie Hanson MRN: [id number] DOB:May 25, 1981, 42 y.o., female Today's Date: 08/28/2022  END OF SESSION:  PT End of Session - 08/28/22 1318     Visit Number 5    Date for PT Re-Evaluation 09/25/22    PT Start Time 1315    PT Stop Time 1400    PT Time Calculation (min) 45 min    Activity Tolerance Patient tolerated treatment well    Behavior During Therapy [facility] for tasks assessed/performed             Past Medical History:  Diagnosis Date   Anxiety    Depression    Nipple pain x's 2 months   Rosacea    Past Surgical History:  Procedure Laterality Date   AUGMENTATION MAMMAPLASTY     breast enhancement & removal   Patient Active Problem List   Diagnosis Date Noted   Raynaud's phenomenon 06/18/2022   Rosacea 03/05/2019   Anxiety 03/05/2019    PCP: Philip Aspen  REFERRING PROVIDER: Earma Reading  REFERRING DIAG: K44.010, G89.29, M54.2  THERAPY DIAG:  Cervicalgia  Muscle weakness (generalized)  Bursitis and tendinitis of shoulder region  Chronic left shoulder pain  Rationale for Evaluation and Treatment: Rehabilitation  ONSET DATE: 07/25/22  SUBJECTIVE:                                                                                                                                                                                      SUBJECTIVE STATEMENT: Shoulder is doing better, it does not hurt as much anymore and is doing better.   PERTINENT HISTORY:   "Pt now is experiencing L shoulder and neck pain. This is a chronic issue that has recently worsened. Pt locates pain to the L-side of her neck, L trapz, and L shoulder."  PAIN:  Are you having pain? Yes: NPRS scale: 2/10 Pain location: L upper trap and shoulder Pain description: dull, sometimes it feels like a shooting, headaches Aggravating factors: working my arms too much, push ups Relieving factors: stretching,  theragun  PRECAUTIONS: None  WEIGHT BEARING RESTRICTIONS: No  FALLS:  Has patient fallen in last 6 months? No  LIVING ENVIRONMENT: Lives with: lives with their family Lives in: House/apartment Stairs: Yes: Internal: 14 steps; on left going up Has following equipment at home: None  OCCUPATION: N/A  PLOF: Independent  PATIENT GOALS:strengthening my neck and scapula area  NEXT MD VISIT:   OBJECTIVE:   DIAGNOSTIC FINDINGS:  IMPRESSION: Negative cervical spine radiographs.  Diagnostic Limited MSK Ultrasound of: Left shoulder Biceps tendon intact normal-appearing Pectoralis muscle normal-appearing Subscapularis tendon normal-appearing Supraspinatus tendon is intact.  Moderate subacromial bursitis is present. Infraspinatus tendon is normal-appearing AC joint normal-appearing Impression: Subacromial bursitis  PATIENT SURVEYS:  FOTO 76  COGNITION: Overall cognitive status: Within functional limits for tasks assessed     SENSATION: WFL  POSTURE: Rounded shoulders  UPPER EXTREMITY ROM: grossly WFL   UPPER EXTREMITY MMT:  MMT Right eval Left eval  Shoulder flexion 4+ 4+  Shoulder extension    Shoulder abduction 4 3+  Shoulder adduction    Shoulder internal rotation 4 4  Shoulder external rotation 4 4  Middle trapezius    Lower trapezius    Elbow flexion 5 5  Elbow extension 5 5  Wrist flexion    Wrist extension    Wrist ulnar deviation    Wrist radial deviation    Wrist pronation    Wrist supination    Grip strength (lbs)    (Blank rows = not tested)  SHOULDER SPECIAL TESTS: Impingement tests: Neer impingement test: negative, Hawkins/Kennedy impingement test: positive , and Painful arc test: negative   PALPATION:  Tightness in L upper trap some trigger points noted    TODAY'S TREATMENT:                                                                                                                                         DATE:  08/28/22 UBE L3  x50mins  Overhead ext with yellow ball against wall 2x10 3 way shoulder greenTB 2x10  Chest press 20# 2x10 Rows and lats 25# 2x10 Shoulder ext 5# 2x10  Banded SA lifts 2x10 red  Scapular retraction green 2x10 Horizontal abd green 2x10  08/22/22 UBE L3 x74mins  Diagonals red TB 2x10 up and down Pec stretch in doorway 30s  Rows and Lats 25# 2x10 Shoulder ext 5# 2x10 Cable rows 10# 2x10  Head against ball 3# flexion with WaTe 2x10 Bicep curls 20# 2x10 Tricep ext 25# 2x10   08/14/22 UBE L3 x3 mins each way Shoulder ext 5# 2x10 Cable rows 10# 2x10 IYT 2# 2x10 Shrugs 4# 2x10 UT stretch with 4# 30s  Chin tucks red band 2x10 OHP yellow ball 2x10 STM with gun to upper traps and c-spine mm  08/09/22 UBE L3 x3 mins each way Rows and Lats 25# 2x10 Shoulder ext 5# 2x10 Horizontal abd greenTB 2x10 DN to bilateral upper traps followed by [place] for 3mins  07/31/22-EVAL   PATIENT EDUCATION: Education details: HEP, POC, dry needling Person educated: Patient Education method: Explanation Education comprehension: verbalized understanding  HOME EXERCISE PROGRAM:  Access Code: [facility] URL: [email or web address] Date: 08/28/2022 Prepared by: Andris Baumann  Exercises - Doorway Pec Stretch at 90 Degrees Abduction  - 1 x daily - 7 x weekly - 30 hold - Seated Upper Trapezius Stretch  - 1 x daily - 7 x weekly - 30 hold - Seated Levator Scapulae Stretch  - 1 x daily - 7 x weekly - 30 hold -  Shoulder External Rotation and Scapular Retraction with Resistance  - 1 x daily - 7 x weekly - 2 sets - 10 reps - Shoulder Flexion Serratus Activation with Resistance  - 1 x daily - 7 x weekly - 2 sets - 10 reps - Standing Shoulder Horizontal Abduction with Resistance  - 1 x daily - 7 x weekly - 2 sets - 10 reps - Standing Plank on Wall with Reaches and Resistance  - 1 x daily - 7 x weekly - 2 sets - 10 reps - Cervical Retraction with Resistance  - 1 x daily - 7 x weekly - 2 sets - 10  reps - Standing Shoulder Row with Anchored Resistance  - 1 x daily - 7 x weekly - 2 sets - 10 reps - Shoulder extension with resistance - Neutral  - 1 x daily - 7 x weekly - 2 sets - 10 reps  ASSESSMENT:  CLINICAL IMPRESSION: Patient doing well, states this is her last scheduled appointment and she feels pretty good overall about her shoulder. She thinks she is good for discharge and with her pain levels being minimal I agreed. We updated her HEP and gave her bands to continue with her exercises at home.   OBJECTIVE IMPAIRMENTS: increased fascial restrictions and pain.   REHAB POTENTIAL: Good  CLINICAL DECISION MAKING: Stable/uncomplicated  EVALUATION COMPLEXITY: Low  GOALS: Goals reviewed with patient? Yes  SHORT TERM GOALS: Target date: 08/28/22  Patient will be independent with initial HEP.  Goal status: MET    LONG TERM GOALS: Target date: 09/25/22 Patient will be independent with advanced/ongoing HEP to improve outcomes and carryover.  Goal status: MET  2.  Patient will report 75% improvement in neck and R shoulder pain to improve QOL.  Baseline: 2/10 Goal status: MET  3.  Patient will demonstrate improved posture to decrease muscle imbalance. Baseline: UT tightness Goal status: MET    PLAN:  PT FREQUENCY: 1x/week  PT DURATION: 8 weeks  PLANNED INTERVENTIONS: Therapeutic exercises, Therapeutic activity, Neuromuscular re-education, Balance training, Gait training, Patient/Family education, Self Care, Joint mobilization, Dry Needling, Electrical stimulation, Cryotherapy, Moist heat, Traction, Ionotophoresis 4mg /ml Dexamethasone, and Manual therapy  PLAN FOR NEXT SESSION: d/c  PHYSICAL THERAPY DISCHARGE SUMMARY  Visits from Start of Care: 5   Patient agrees to discharge. Patient goals were met. Patient is being discharged due to being pleased with the current functional level.    Ryan Park, PT 08/28/2022, 2:00 PM

## 2022-08-28 ENCOUNTER — Ambulatory Visit: Payer: Managed Care, Other (non HMO)

## 2022-08-28 DIAGNOSIS — M7590 Shoulder lesion, unspecified, unspecified shoulder: Secondary | ICD-10-CM

## 2022-08-28 DIAGNOSIS — M542 Cervicalgia: Secondary | ICD-10-CM | POA: Diagnosis not present

## 2022-08-28 DIAGNOSIS — G8929 Other chronic pain: Secondary | ICD-10-CM

## 2022-08-28 DIAGNOSIS — M6281 Muscle weakness (generalized): Secondary | ICD-10-CM

## 2022-09-04 ENCOUNTER — Telehealth: Payer: Managed Care, Other (non HMO)

## 2022-09-05 ENCOUNTER — Ambulatory Visit: Payer: Managed Care, Other (non HMO) | Admitting: Family Medicine

## 2022-09-06 ENCOUNTER — Other Ambulatory Visit: Payer: Self-pay | Admitting: Internal Medicine

## 2022-09-18 ENCOUNTER — Encounter: Payer: Managed Care, Other (non HMO) | Admitting: Nurse Practitioner

## 2022-10-16 ENCOUNTER — Encounter: Payer: Managed Care, Other (non HMO) | Admitting: Nurse Practitioner

## 2022-10-19 ENCOUNTER — Telehealth: Payer: Self-pay | Admitting: Podiatry

## 2022-10-19 ENCOUNTER — Ambulatory Visit (INDEPENDENT_AMBULATORY_CARE_PROVIDER_SITE_OTHER): Payer: Managed Care, Other (non HMO) | Admitting: Podiatry

## 2022-10-19 ENCOUNTER — Encounter: Payer: Self-pay | Admitting: Podiatry

## 2022-10-19 ENCOUNTER — Ambulatory Visit (INDEPENDENT_AMBULATORY_CARE_PROVIDER_SITE_OTHER): Payer: Managed Care, Other (non HMO)

## 2022-10-19 DIAGNOSIS — M722 Plantar fascial fibromatosis: Secondary | ICD-10-CM

## 2022-10-19 DIAGNOSIS — M7751 Other enthesopathy of right foot: Secondary | ICD-10-CM

## 2022-10-19 DIAGNOSIS — M79671 Pain in right foot: Secondary | ICD-10-CM

## 2022-10-19 MED ORDER — MELOXICAM 15 MG PO TABS: 15.0000 mg | ORAL_TABLET | Freq: Every day | ORAL | 0 refills | Status: DC

## 2022-10-19 NOTE — Progress Notes (Signed)
  Subjective:  Patient ID: Marie Hanson, female    DOB: [birth year],   MRN: [id number]  Chief Complaint  Patient presents with   Foot Pain    Right foot pain ongoing for 8 months     42 y.o. female presents for concern of pain in her arch, achilles area and around her ankle. Relates pain and numbness. Relates this issue has been going on for about the 8 months but in last few weeks it has worsened. She is very active. Relates she has seen sports medicine and was doing physical therapy and had been helping.   . Denies any other pedal complaints. Denies n/v/f/c.   Past Medical History:  Diagnosis Date   Anxiety    Depression    Nipple pain x's 2 months   Rosacea     Objective:  Physical Exam: Vascular: DP/PT pulses 2/4 bilateral. CFT <3 seconds. Normal hair growth on digits. No edema.  Skin. No lacerations or abrasions bilateral feet.  Musculoskeletal: MMT 5/5 bilateral lower extremities in DF, PF, Inversion and Eversion. Deceased ROM in DF of ankle joint. Tender to central calcaneal tubercle upon palpation. No pain along PT achilles or in arch. Tenderness to anterior ankle joint line. No pain with MMT.  Neurological: Sensation intact to light touch.   Assessment:   1. Plantar fasciitis, right   2. Capsulitis of right ankle      Plan:  Patient was evaluated and treated and all questions answered. X-rays reviewed and discussed with patient. No acute fractures or dislocations noted.  No acute fractures or dislocations. Mild haglunds deformity and small os tibial externum noted.  Reviewed notes from sports medicine Discussed plantar fasciitis with patient.  Appears that some of her plantar foot pain could be coming from this. Some of her pain is likely lumbar radiculopathy. The ankle pain is unclear and could be some nerve irritation vs some capsultisi of the ankle.  Discussed treatment options including, ice, NSAIDS, supportive shoes, bracing, and stretching. Stretching  exercises provided to be done on a daily basis.   Prescription for meloxicam provided and sent to pharmacy. PF brace dispensed.  Fitted for CMO.  Follow-up 6 weeks or sooner if any problems arise. In the meantime, encouraged  Lorenda Peck, DPM

## 2022-10-19 NOTE — Telephone Encounter (Signed)
Pt called and was just seen by Dr Blenda Mounts in [place] and she was ordering pt orthotics. Pt wanted to make sure she was ordering the sports style orthotic and Dr Blenda Mounts confirmed that is what she is ordering. Notified pt and she said thank you.

## 2022-10-19 NOTE — Patient Instructions (Signed)

## 2022-10-22 NOTE — Progress Notes (Unsigned)
NEW PATIENT Date of Service/Encounter:  10/24/22 Referring provider: Isaac Bliss, [place]* Primary care provider: Isaac Bliss, Rayford Halsted, MD  Subjective:  Marie Hanson is a 42 y.o. female with a PMHx of rosacea, anxiety, raynaud's syndrome presenting today for evaluation of allergic rhinitis. History obtained from: chart review and patient.   Chronic rhinitis:  Symptoms include: mostly right-sided congestion and pain.  She does sneeze a lot. She also reports itchy eyes, occasional drainage.   She was evaluated by ENT last week. She had a normal rhinoscopy. No polyps. She moved to [place] 3.5 years ago and these  symptoms worsened since moving here. Occurs  spring and fall. Treatments tried: zyrtec, and occasional Claritin D if high allergy day. Tried flonase. Switched to nasonex and azelastine. Told to stop azelastine by ENT due to nasal mucosal thinning. She uses saline spray, and nasal gel which doesn't help with the pain. She feels pain due to dryness. Horses when on carriage rides make her have significant congestion and chest tightness once. She is not around horses typically.  Previous allergy testing:  yes-only had one injection at [facility], then stopped going due to difficulty with scheduling History of reflux/heartburn: yes Previous sinus, ear, tonsil, adenoid surgeries: has had nasal cauterization [name] allergy testing + to mouse, DM, horse, dog, cockroach, cattle, cat, molds, feathers, willow tree, walnut, sycamore, elm, ash, alder, kochia, alfalfa, grass pollen  Evaluated by ENT 10/18/22-polyps not seen on flexible nasal endoscopy  Other allergy screening: Asthma:  in childhood Medication allergy: no Urticaria:  rarely will have hives when eating, but never been able to pinpoint it. Nickel allergy --  Eczema: yes-occasionally, flares last year around her nipple. Normal mammogram. She did see a dermatologist and was given a non-steroid cream that didn't work.  Eucerin urea cream did work.  She has also had a spot on her left eyelid, but none in over a year.   Past Medical History: Past Medical History:  Diagnosis Date   Anxiety    Depression    Eczema    Nipple pain x's 2 months   Rosacea    Urticaria    Medication List:  Current Outpatient Medications  Medication Sig Dispense Refill   Azelaic Acid 15 % gel AFTER SKIN IS THOROUGHLY WASHED AND PATTED DRY, GENTLY AND THOROUGHLY APPLY A THIN FILM TO AFFECTED AREA IN THE MORNING AND AT NIGHT 50 g 2   doxycycline (VIBRAMYCIN) 100 MG capsule Take 50 mg by mouth daily.     EPINEPHrine (EPIPEN 2-PAK) 0.3 mg/0.3 mL IJ SOAJ injection Inject 0.3 mg into the muscle as needed for anaphylaxis. Bring to allergy injection appointments 2 each 2   Multiple Vitamin (MULTIVITAMIN) tablet Take 1 tablet by mouth daily.     [name] 3-0.02 MG tablet TAKE 1 TABLET BY MOUTH EVERY DAY 84 tablet 1   Omega-3 Fatty Acids (FISH OIL) 1000 MG CAPS Take by mouth.     RESTASIS 0.05 % ophthalmic emulsion      meloxicam (MOBIC) 15 MG tablet Take 1 tablet (15 mg total) by mouth daily. (Patient not taking: Reported on 10/24/2022) 30 tablet 0   No current facility-administered medications for this visit.   Known Allergies:  No Known Allergies Past Surgical History: Past Surgical History:  Procedure Laterality Date   AUGMENTATION MAMMAPLASTY     breast enhancement & removal   Family History: Family History  Problem Relation Age of Onset   Urticaria Mother    Eczema Mother  Allergic rhinitis Mother    Diabetes Mother    Depression Father    Anxiety disorder Father    BRCA 1/2 Neg Hx    Breast cancer Neg Hx    Social History: Marie Hanson lives in a townhouse built 6 years ago, carpet floors, gas heating, central AC, 2 maltese dogs, no roaches, using DM protection on bedding not pillows, no smoke exposure, works as a Agricultural engineer, + Arts administrator.   ROS:  All other systems negative except as noted per HPI.  Objective:   Blood pressure 118/80, pulse 78, temperature 98 F (36.7 C), height '5\' 3"'$  (1.6 m), weight 108 lb 11.2 oz (49.3 kg), last menstrual period 10/19/2022, SpO2 100 %. Body mass index is 19.26 kg/m. Physical Exam:  General Appearance:  Alert, cooperative, no distress, appears stated age  Head:  Normocephalic, without obvious abnormality, atraumatic  Eyes:  Conjunctiva clear, EOM's intact  Nose: Nares normal, hypertrophic turbinates, normal mucosa, no visible anterior polyps, and septum midline  Throat: Lips, tongue normal; teeth and gums normal, normal posterior oropharynx  Neck: Supple, symmetrical  Lungs:   clear to auscultation bilaterally, Respirations unlabored, no coughing  Heart:  regular rate and rhythm and no murmur, Appears well perfused  Extremities: No edema  Skin: Skin color, texture, turgor normal, no rashes or lesions on visualized portions of skin  Neurologic: No gross deficits     Diagnostics: Skin Testing: Environmental allergy panel.  Adequate controls. Results discussed with patient/family.  Airborne Adult Perc - 10/24/22 1400     Time Antigen Placed 1417    Allergen Manufacturer Lavella Hammock    Location Back    Number of Test 59    1. Control-Buffer 50% Glycerol Negative    2. Control-Histamine 1 mg/ml 3+    3. Albumin saline Negative    4. [place] 3+    5. [place] 3+    6. [place] 3+    7. [place] Blue Negative    8. Meadow Fescue 4+    9. Perennial Rye Negative    10. Sweet Vernal 2+    11. Timothy 3+    12. Cocklebur Negative    13. Burweed Marshelder Negative    14. Ragweed, short Negative    15. Ragweed, Giant Negative    16. Plantain,  English 2+    17. Lamb's Quarters 2+    18. Sheep Sorrell Negative    19. Rough Pigweed Negative    20. Marsh Elder, Rough Negative    21. Mugwort, Common 2+    22. Ash mix Negative    23. Birch mix 2+    24. Beech American 2+    25. Box, Elder Negative    26. Cedar, red Negative    27. Cottonwood, [place] Negative     28. Elm mix 3+    29. Hickory Negative    30. Maple mix Negative    31. Oak, [place] mix 2+    32. Pecan Pollen Negative    33. Pine mix Negative    34. Sycamore Eastern Negative    35. [place], Black Pollen Negative    36. Alternaria alternata Negative    37. Cladosporium Herbarum Negative    38. Aspergillus mix 3+    39. Penicillium mix Negative    40. Bipolaris sorokiniana (Helminthosporium) Negative    41. Drechslera spicifera (Curvularia) 3+    42. Mucor plumbeus Negative    43. Fusarium moniliforme 3+    44. Aureobasidium pullulans (pullulara) Negative  45. Rhizopus oryzae Negative    46. Botrytis cinera Negative    47. Epicoccum nigrum Negative    48. Phoma betae Negative    49. Candida Albicans 2+    50. Trichophyton mentagrophytes Negative    51. Mite, D Farinae  5,000 AU/ml Negative    52. Mite, D Pteronyssinus  5,000 AU/ml Negative    53. Cat Hair 10,000 BAU/ml Negative    54.  Dog Epithelia 2+    55. Mixed Feathers Negative    56. Horse Epithelia 3+    57. Cockroach, German Negative    58. Mouse Negative    59. Tobacco Leaf 3+             Intradermal - 10/24/22 1500     Time Antigen Placed 1500    Allergen Manufacturer Greer    Location Arm    Number of Test 5    Control Negative    Ragweed mix 2+    Mold 1 Negative    Cat 3+    Mite mix Negative             Food Adult Perc - 10/24/22 1400     Time Antigen Placed 1418    Allergen Manufacturer Lavella Hammock    Location Back    1. Peanut Negative    2. Soybean Negative    3. Wheat Negative    4. Sesame Negative    5. Milk, cow Negative    6. Egg White, Chicken Negative    7. Casein Negative    8. Shellfish Mix Negative    9. Fish Mix Negative    10. Cashew Negative             Allergy testing results were read and interpreted by myself, documented by clinical staff.  Assessment and Plan  Chronic Rhinitis: Seasonal and Perennial Allergic: - allergy testing today: positive to grass  pollen, weed pollen, tree pollen, indoor/outdoor molds, dog, horse, tobacco leaf -intradermal testing positive to cat and ragweed - Prevention:  - allergen avoidance when possible - consider allergy shots as long term control of your symptoms by teaching your immune system to be more tolerant of your allergy triggers  - someone will call you to set-up RUSH appointment - Symptom control: - avoid medicated nasal sprays due to nasal dryness - okay and encouraged to use saline gels, sprays or rinses as needed - Continue Antihistamine: daily or daily as needed.   -Options include Zyrtec (Cetirizine) '10mg'$ , Claritin (Loratadine) '10mg'$ , Allegra (Fexofenadine) '180mg'$ , or Xyzal (Levocetirinze) '5mg'$  - Can be purchased over-the-counter if not covered by insurance. - decongestants such as Claritin-D should be used sparingly and only for severe congestion-  - no more than 3 days in a row, no more than 1-2 times per month  Allergic Conjunctivitis:  - Consider Allergy Eye drops-pataday 1 drop each eye daily as needed  -Avoid eye drops that say red eye relief as they may contain medications that dry out your eyes.  Nickel contact allergy:  - avoid nickel  - can use topical steroids such as hydrocortisone if symptoms occur  Recurrent hives, no known trigger:  - food allergy testing to most common food allergens negative (these are the foods that make up 90% of all food allergies) - keep track of anything you do 3 hours prior to onset of hives - start zyrtec 10 mg - can take up to 20 mg twice daily during hives if needed -schedule a follow-up if recurs -  some people have non-allergic hives, we may explore this more if this continues to occur  Follow up : 3 months, sooner if needed It was a pleasure meeting you in clinic today! Thank you for allowing me to participate in your care.  This note in its entirety was forwarded to the Provider who requested this consultation.  Thank you for your kind  referral. I appreciate the opportunity to take part in Donetta's care. Please do not hesitate to contact me with questions.  Sincerely,  Sigurd Sos, MD [facility] [place] [facility] [place]

## 2022-10-24 ENCOUNTER — Other Ambulatory Visit: Payer: Self-pay

## 2022-10-24 ENCOUNTER — Ambulatory Visit (INDEPENDENT_AMBULATORY_CARE_PROVIDER_SITE_OTHER): Payer: Managed Care, Other (non HMO) | Admitting: Internal Medicine

## 2022-10-24 ENCOUNTER — Encounter: Payer: Self-pay | Admitting: Internal Medicine

## 2022-10-24 VITALS — BP 118/80 | HR 78 | Temp 98.0°F | Ht 63.0 in | Wt 108.7 lb

## 2022-10-24 DIAGNOSIS — J302 Other seasonal allergic rhinitis: Secondary | ICD-10-CM

## 2022-10-24 DIAGNOSIS — L2084 Intrinsic (allergic) eczema: Secondary | ICD-10-CM | POA: Diagnosis not present

## 2022-10-24 DIAGNOSIS — H1013 Acute atopic conjunctivitis, bilateral: Secondary | ICD-10-CM | POA: Diagnosis not present

## 2022-10-24 DIAGNOSIS — L508 Other urticaria: Secondary | ICD-10-CM

## 2022-10-24 DIAGNOSIS — Z9109 Other allergy status, other than to drugs and biological substances: Secondary | ICD-10-CM

## 2022-10-24 DIAGNOSIS — J3089 Other allergic rhinitis: Secondary | ICD-10-CM | POA: Diagnosis not present

## 2022-10-24 MED ORDER — EPINEPHRINE 0.3 MG/0.3ML IJ SOAJ: 0.3000 mg | INTRAMUSCULAR | 2 refills | Status: DC | PRN

## 2022-10-24 NOTE — Patient Instructions (Signed)
Chronic Rhinitis: Seasonal and Perennial Allergic: - allergy testing today: positive to grass pollen, weed pollen, tree pollen, indoor/outdoor molds, dog, horse, tobacco leaf -intradermal testing positive to cat and ragweed  - Prevention:  - allergen avoidance when possible - consider allergy shots as long term control of your symptoms by teaching your immune system to be more tolerant of your allergy triggers  - someone will call you to set-up RUSH appointment  - Symptom control: - avoid medicated nasal sprays due to nasal dryness - okay and encouraged to use saline gels, sprays or rinses as needed - Continue Antihistamine: daily or daily as needed.   -Options include Zyrtec (Cetirizine) '10mg'$ , Claritin (Loratadine) '10mg'$ , Allegra (Fexofenadine) '180mg'$ , or Xyzal (Levocetirinze) '5mg'$  - Can be purchased over-the-counter if not covered by insurance. - decongestants such as Claritin-D should be used sparingly and only for severe congestion-  - no more than 3 days in a row, no more than 1-2 times per month  Allergic Conjunctivitis:  - Consider Allergy Eye drops-pataday 1 drop each eye daily as needed  -Avoid eye drops that say red eye relief as they may contain medications that dry out your eyes.  Nickel contact allergy:  - avoid nickel  - can use topical steroids such as hydrocortisone if symptoms occur  Recurrent hives, no known trigger:  - food allergy testing to most common food allergens negative (these are the foods that make up 90% of all food allergies) - keep track of anything you do 3 hours prior to onset of hives - start zyrtec 10 mg - can take up to 20 mg twice daily during hives if needed -schedule a follow-up if recurs - some people have non-allergic hives, we may explore this more if this continues to occur  Follow up : 3 months, sooner if needed It was a pleasure meeting you in clinic today! Thank you for allowing me to participate in your care.  Sigurd Sos, MD [facility]  General information about allergy shots:  Allergy shots always are given starting at low (very dilute) doses and increase over time to more concentrated allergen. This is called build-up. There are two protocols that we offer for build-up.   Rush immunotherapy -- one day of shots lasting from 8-4pm, shots every 1 hour. Approximately half of the build-up process is completed in that one day. The following week, normal build-up is resumed, and this entails ~20 visits either weekly or twice weekly, then ~5 visits every other week, then every 3 weeks, then every 4 weeks. The regular build-up appointments are nurse visits the shots are administered, followed by required monitoring for 30 minutes.  Traditional build-up -- weekly or twice weekly visits for several months, then space out to every 2 weeks, then every 3 weeks, then every 4 weeks as above. At these appointments, the shots(s) are administered, followed by the required 30 minute monitoring period.   Either way is fine and both are equally effective. With the rush protocol, the advantage is that less time is spent here for shots overall, and the patient would reach maintenance dosing faster (which is when the clinical benefit starts to become apparent). Regardless of the build-up method you choose, the ultimate schedule is once monthly injections, which you then continue for 3-5 years for maximum sustained benefit.   IF we proceed with the rush protocol, there are premedications the day before and the day after the rush only (this includes antihistamines, steroids, and singulair).  After the rush day, no prednisone or singulair is required, and we just recommend antihistamines.   Reducing Pollen Exposure  The American Academy of Allergy, Asthma and Immunology suggests the following steps to reduce your exposure to pollen during allergy seasons.    Do not hang sheets or clothing out to dry; pollen may collect on these  items. Do not mow lawns or spend time around freshly cut grass; mowing stirs up pollen. Keep windows closed at night.  Keep car windows closed while driving. Minimize morning activities outdoors, a time when pollen counts are usually at their highest. Stay indoors as much as possible when pollen counts or humidity is high and on windy days when pollen tends to remain in the air longer. Use air conditioning when possible.  Many air conditioners have filters that trap the pollen spores. Use a HEPA room air filter to remove pollen form the indoor air you breathe. DUST MITE AVOIDANCE MEASURES:  There are three main measures that need and can be taken to avoid house dust mites:  Reduce accumulation of dust in general -reduce furniture, clothing, carpeting, books, stuffed animals, especially in bedroom  Separate yourself from the dust -use pillow and mattress encasements (can be found at stores such as [employer] or online) -avoid direct exposure to air condition flow -use a HEPA filter device, especially in the bedroom; you can also use a HEPA filter vacuum cleaner -wipe dust with a moist towel instead of a dry towel or broom when cleaning  Decrease mites and/or their secretions -wash clothing and linen and stuffed animals at highest temperature possible, at least every 2 weeks -stuffed animals can also be placed in a bag and put in a freezer overnight  Despite the above measures, it is impossible to eliminate dust mites or their allergen completely from your home.  With the above measures the burden of mites in your home can be diminished, with the goal of minimizing your allergic symptoms.  Success will be reached only when implementing and using all means together. Control of Mold Allergen   Mold and fungi can grow on a variety of surfaces provided certain temperature and moisture conditions exist.  Outdoor molds grow on plants, decaying vegetation and soil.  The major outdoor  mold, Alternaria and Cladosporium, are found in very high numbers during hot and dry conditions.  Generally, a late Summer - Fall peak is seen for common outdoor fungal spores.  Rain will temporarily lower outdoor mold spore count, but counts rise rapidly when the rainy period ends.  The most important indoor molds are Aspergillus and Penicillium.  Dark, humid and poorly ventilated basements are ideal sites for mold growth.  The next most common sites of mold growth are the bathroom and the kitchen.  Outdoor (Seasonal) Mold Control  Use air conditioning and keep windows closed Avoid exposure to decaying vegetation. Avoid leaf raking. Avoid grain handling. Consider wearing a face mask if working in moldy areas.    Indoor (Perennial) Mold Control   Maintain humidity below 50%. Clean washable surfaces with 5% bleach solution. Remove sources e.g. contaminated carpets.   Control of Dog or Cat Allergen  Avoidance is the best way to manage a dog or cat allergy. If you have a dog or cat and are allergic to dog or cats, consider removing the dog or cat from the home. If you have a dog or cat but don't want to find it a new home, or if your family  wants a pet even though someone in the household is allergic, here are some strategies that may help keep symptoms at bay:  Keep the pet out of your bedroom and restrict it to only a few rooms. Be advised that keeping the dog or cat in only one room will not limit the allergens to that room. Don't pet, hug or kiss the dog or cat; if you do, wash your hands with soap and water. High-efficiency particulate air (HEPA) cleaners run continuously in a bedroom or living room can reduce allergen levels over time. Regular use of a high-efficiency vacuum cleaner or a central vacuum can reduce allergen levels. Giving your dog or cat a bath at least once a week can reduce airborne allergen.

## 2022-10-25 DIAGNOSIS — J3081 Allergic rhinitis due to animal (cat) (dog) hair and dander: Secondary | ICD-10-CM | POA: Diagnosis not present

## 2022-10-25 NOTE — Progress Notes (Signed)
Aeroallergen Immunotherapy   Ordering Provider: Dr. Sigurd Sos   Patient Details  Name: Marie Hanson  MRN: [id number]  Date of Birth: [birth year]   Order 2 of 2   Vial Label: M/DM/C/CR   0.2 ml (Volume)  1:10 Concentration -- Aspergillus mix  0.2 ml (Volume)  1:20 Concentration -- Drechslera spicifera  0.2 ml (Volume)  1:10 Concentration -- Fusarium moniliforme  0.2 ml (Volume)  1:40 Concentration -- Phoma betae  0.5 ml (Volume)  1:10 Concentration -- Cat Hair  0.3 ml (Volume)  1:20 Concentration -- Cockroach, German  0.5 ml (Volume)   AU Concentration -- Mite Mix (DF 5,000 & DP 5,000)    2.1  ml Extract Subtotal  2.9  ml Diluent  5.0  ml Maintenance Total   Schedule:  RUSH  Silver Vial (1:1,000,000): RUSH  Blue Vial (1:100,000): RUSH  Yellow Vial (1:10,000): RUSH  Green Vial (1:1,000): Schedule B (6 doses)  Red Vial (1:100): Schedule A (10 doses)   Special Instructions: RUSH, green B, red A-RUSH HP, home [place]

## 2022-10-25 NOTE — Progress Notes (Signed)
Aeroallergen Immunotherapy   Ordering Provider: Dr. Sigurd Sos   Patient Details  Name: Marie Hanson  MRN: [id number]  Date of Birth: 1981-01-25   Order 1 of 2   Vial Label: G/W/T/Dog   0.3 ml (Volume)  BAU Concentration -- 7 Grass Mix* 100,000 ([address] [place], [place], [place], [place], [place], Sweet [place], [place])  0.2 ml (Volume)  1:20 Concentration -- [place]  0.3 ml (Volume)  BAU Concentration -- [place] 10,000  0.2 ml (Volume)  1:20 Concentration -- [place]  0.3 ml (Volume)  1:20 Concentration -- Ragweed Mix  0.2 ml (Volume)  1:10 Concentration -- Plantain English  0.5 ml (Volume)  1:20 Concentration -- Weed Mix*  0.5 ml (Volume)  1:20 Concentration -- Eastern 10 Tree Mix (also Sweet Gum)  0.5 ml (Volume)  1:10 Concentration -- Dog Epithelia    3.0  ml Extract Subtotal  2.0  ml Diluent  5.0  ml Maintenance Total   Schedule:  RUSH  Silver Vial (1:1,000,000): RUSH  Blue Vial (1:100,000): RUSH  Yellow Vial (1:10,000): RUSH  Green Vial (1:1,000): Schedule B (6 doses)  Red Vial (1:100): Schedule A (10 doses)   Special Instructions: RUSH, green B, red A-HP RUSH, Home [place]

## 2022-10-25 NOTE — Progress Notes (Addendum)
VIALS EXP 10-25-23.

## 2022-10-26 DIAGNOSIS — J3089 Other allergic rhinitis: Secondary | ICD-10-CM

## 2022-10-31 ENCOUNTER — Other Ambulatory Visit: Payer: Self-pay | Admitting: *Deleted

## 2022-10-31 DIAGNOSIS — Z3041 Encounter for surveillance of contraceptive pills: Secondary | ICD-10-CM

## 2022-10-31 MED ORDER — DROSPIRENONE-ETHINYL ESTRADIOL 3-0.02 MG PO TABS: 1.0000 | ORAL_TABLET | Freq: Every day | ORAL | 1 refills | Status: DC

## 2022-11-01 ENCOUNTER — Encounter: Payer: Self-pay | Admitting: Internal Medicine

## 2022-11-01 NOTE — Telephone Encounter (Signed)
Can we place a dermatology referral for post inflammatory hyperpigmentation? Thanks

## 2022-11-06 ENCOUNTER — Other Ambulatory Visit: Payer: Self-pay

## 2022-11-06 MED ORDER — FAMOTIDINE 20 MG PO TABS: ORAL_TABLET | ORAL | 0 refills | Status: DC

## 2022-11-06 MED ORDER — CETIRIZINE HCL 10 MG PO TABS: ORAL_TABLET | ORAL | 0 refills | Status: DC

## 2022-11-06 MED ORDER — MONTELUKAST SODIUM 10 MG PO TABS: ORAL_TABLET | ORAL | 0 refills | Status: DC

## 2022-11-06 MED ORDER — PREDNISONE 20 MG PO TABS: ORAL_TABLET | ORAL | 0 refills | Status: DC

## 2022-11-06 NOTE — Telephone Encounter (Signed)
Pt was notify of pre-med's being sent to the [facility] pharmacy and health check was done.

## 2022-11-08 ENCOUNTER — Other Ambulatory Visit: Payer: Self-pay

## 2022-11-08 MED ORDER — EPINEPHRINE 0.3 MG/0.3ML IJ SOAJ: 0.3000 mg | INTRAMUSCULAR | 1 refills | Status: DC | PRN

## 2022-11-13 ENCOUNTER — Ambulatory Visit (INDEPENDENT_AMBULATORY_CARE_PROVIDER_SITE_OTHER): Payer: Managed Care, Other (non HMO) | Admitting: Internal Medicine

## 2022-11-13 VITALS — BP 110/76 | HR 77 | Temp 97.9°F | Resp 17

## 2022-11-13 DIAGNOSIS — J3089 Other allergic rhinitis: Secondary | ICD-10-CM | POA: Diagnosis not present

## 2022-11-13 DIAGNOSIS — J302 Other seasonal allergic rhinitis: Secondary | ICD-10-CM

## 2022-11-13 DIAGNOSIS — J309 Allergic rhinitis, unspecified: Secondary | ICD-10-CM

## 2022-11-13 NOTE — Progress Notes (Unsigned)
RAPID DESENSITIZATION Note  RE: Marie Hanson MRN: [id number] DOB: 1981-01-09 Date of Office Visit: 11/13/2022  Subjective:  Patient presents today for rapid desensitization.  Interval History: Patient has not been ill, she has taken all premedications as per protocol.  Recent/Current History: Pulmonary disease: no Cardiac disease: no Respiratory infection: no Rash: no Itch: no Swelling: no Cough: no Shortness of breath: no Runny/stuffy nose: no Itchy eyes: no Beta-blocker use: no  Patient/guardian was informed of the procedure with verbalized understanding of the risk of anaphylaxis. Consent has been signed.   Medication List:  Current Outpatient Medications  Medication Sig Dispense Refill   Azelaic Acid 15 % gel AFTER SKIN IS THOROUGHLY WASHED AND PATTED DRY, GENTLY AND THOROUGHLY APPLY A THIN FILM TO AFFECTED AREA IN THE MORNING AND AT NIGHT 50 g 2   cetirizine (ZYRTEC) 10 MG tablet Take 1 tab Monday morning and 1 tab Tuesday morning before RUSH appt. 2 tablet 0   doxycycline (VIBRAMYCIN) 100 MG capsule Take 50 mg by mouth daily.     drospirenone-ethinyl estradiol ([name]) 3-0.02 MG tablet Take 1 tablet by mouth daily. 84 tablet 1   EPINEPHrine 0.3 mg/0.3 mL IJ SOAJ injection Inject 0.3 mg into the muscle as needed for anaphylaxis. 1 each 1   famotidine (PEPCID) 20 MG tablet Take 1 tab Monday morning & evening, than take 1 Tuesday morning before RUSH appt. 4 tablet 0   meloxicam (MOBIC) 15 MG tablet Take 1 tablet (15 mg total) by mouth daily. (Patient not taking: Reported on 10/24/2022) 30 tablet 0   montelukast (SINGULAIR) 10 MG tablet Take 1 tab Monday morning and 1 tab Tuesday morning before RUSH appt. 2 tablet 0   Multiple Vitamin (MULTIVITAMIN) tablet Take 1 tablet by mouth daily.     Omega-3 Fatty Acids (FISH OIL) 1000 MG CAPS Take by mouth.     predniSONE (DELTASONE) 20 MG tablet Take 2 tabs Monday morning and take 2 tabs Tuesday morning before RUSH appt. 4  tablet 0   RESTASIS 0.05 % ophthalmic emulsion      No current facility-administered medications for this visit.   Allergies: No Known Allergies I reviewed her past medical history, social history, family history, and environmental history and no significant changes have been reported from her previous visit.  ROS: Negative except as per HPI.  Objective: LMP 10/19/2022  There is no height or weight on file to calculate BMI.   General Appearance:  Alert, cooperative, no distress, appears stated age  Head:  Normocephalic, without obvious abnormality, atraumatic  Eyes:  Conjunctiva clear, EOM's intact  Nose: Nares normal  Throat: Lips, tongue normal; teeth and gums normal, normal posterior oropharnyx  Neck: Supple, symmetrical  Lungs:   CTAB, Respirations unlabored, no coughing  Heart:  Appears well perfused  Extremities: No edema  Skin: Skin color, texture, turgor normal, no rashes or lesions on visualized portions of skin  Neurologic: No gross deficits     Diagnostics:   PROCEDURES:  Patient received the following doses every hour: Step 1:  0.50ml - 1:1,000,000 dilution (silver vial) Step 2:  0.38ml - 1:1,000,000 dilution (silver vial) Step 3: 0.90ml - 1:100,000 dilution (blue vial)  Step 4: 0.76ml - 1:100,000 dilution (blue vial)  Step 5: 0.66ml - 1:10,000 dilution (gold vial) Step 6: 0.27ml - 1:10,000 dilution (gold vial) Step 7: 0.3ml - 1:10,000 dilution (gold vial) Step 8: 0.33ml - 1:10,000 dilution (gold vial)  Patient was observed for 1 hour after the last dose.  Procedure started at *** Procedure ended at ***   ASSESSMENT/PLAN:   Patient has tolerated the rapid desensitization protocol.  Next appointment: Start at 0.29ml of 1:1000 dilution (green vial) and build up per protocol.

## 2022-11-15 ENCOUNTER — Encounter: Payer: Self-pay | Admitting: Internal Medicine

## 2022-11-15 ENCOUNTER — Other Ambulatory Visit (HOSPITAL_COMMUNITY)
Admission: RE | Admit: 2022-11-15 | Discharge: 2022-11-15 | Disposition: A | Discharge status: Home / Self Care | Payer: Managed Care, Other (non HMO) | Source: Ambulatory Visit | Attending: Internal Medicine | Admitting: Internal Medicine

## 2022-11-15 ENCOUNTER — Ambulatory Visit (INDEPENDENT_AMBULATORY_CARE_PROVIDER_SITE_OTHER): Payer: Managed Care, Other (non HMO) | Admitting: Internal Medicine

## 2022-11-15 VITALS — BP 102/74 | HR 65 | Temp 98.4°F | Wt 109.1 lb

## 2022-11-15 DIAGNOSIS — N898 Other specified noninflammatory disorders of vagina: Secondary | ICD-10-CM | POA: Diagnosis not present

## 2022-11-15 DIAGNOSIS — L700 Acne vulgaris: Secondary | ICD-10-CM | POA: Diagnosis not present

## 2022-11-15 DIAGNOSIS — N3001 Acute cystitis with hematuria: Secondary | ICD-10-CM | POA: Diagnosis not present

## 2022-11-15 LAB — URINALYSIS
Bilirubin Urine: NEGATIVE
Ketones, ur: NEGATIVE
Leukocytes,Ua: NEGATIVE
Nitrite: NEGATIVE
Specific Gravity, Urine: 1.01 (ref 1.000–1.030)
Total Protein, Urine: NEGATIVE
Urine Glucose: NEGATIVE
Urobilinogen, UA: 0.2 (ref 0.0–1.0)
pH: 7 (ref 5.0–8.0)

## 2022-11-15 LAB — POC URINALSYSI DIPSTICK (AUTOMATED)
Bilirubin, UA: NEGATIVE
Glucose, UA: NEGATIVE
Ketones, UA: NEGATIVE
Leukocytes, UA: NEGATIVE
Nitrite, UA: NEGATIVE
Protein, UA: NEGATIVE
Spec Grav, UA: 1.01 (ref 1.010–1.025)
Urobilinogen, UA: 0.2 E.U./dL
pH, UA: 7 (ref 5.0–8.0)

## 2022-11-15 MED ORDER — SULFAMETHOXAZOLE-TRIMETHOPRIM 800-160 MG PO TABS: 1.0000 | ORAL_TABLET | Freq: Two times a day (BID) | ORAL | 0 refills | Status: AC

## 2022-11-15 MED ORDER — SPIRONOLACTONE 25 MG PO TABS: 25.0000 mg | ORAL_TABLET | Freq: Every day | ORAL | 1 refills | Status: DC

## 2022-11-15 NOTE — Progress Notes (Signed)
Established Patient Office Visit     CC/Reason for Visit: Vaginal itching, acne  HPI: Marie Hanson is a 42 y.o. female who is coming in today for the above mentioned reasons.  Since December she has been treated 4 times with fluconazole for presumed vaginal yeast.  These have all been done via virtual visits.  She has never had a vaginal discharge.  She has noticed some dysuria.  She would like to come off birth control that she uses to treat acne.  She has been prescribed spironolactone for this in the past and is requesting the same as well as a dermatology referral.   Past Medical/Surgical History: Past Medical History:  Diagnosis Date   Anxiety    Depression    Eczema    Nipple pain x's 2 months   Rosacea    Urticaria     Past Surgical History:  Procedure Laterality Date   AUGMENTATION MAMMAPLASTY     breast enhancement & removal    Social History:  reports that she has never smoked. She has never been exposed to tobacco smoke. She has never used smokeless tobacco. She reports that she does not currently use alcohol. She reports that she does not use drugs.  Allergies: No Known Allergies  Family History:  Family History  Problem Relation Age of Onset   Urticaria Mother    Eczema Mother    Allergic rhinitis Mother    Diabetes Mother    Depression Father    Anxiety disorder Father    BRCA 1/2 Neg Hx    Breast cancer Neg Hx      Current Outpatient Medications:    Azelaic Acid 15 % gel, AFTER SKIN IS THOROUGHLY WASHED AND PATTED DRY, GENTLY AND THOROUGHLY APPLY A THIN FILM TO AFFECTED AREA IN THE MORNING AND AT NIGHT, Disp: 50 g, Rfl: 2   drospirenone-ethinyl estradiol ([name]) 3-0.02 MG tablet, Take 1 tablet by mouth daily., Disp: 84 tablet, Rfl: 1   Multiple Vitamin (MULTIVITAMIN) tablet, Take 1 tablet by mouth daily., Disp: , Rfl:    Omega-3 Fatty Acids (FISH OIL) 1000 MG CAPS, Take by mouth., Disp: , Rfl:    RESTASIS 0.05 % ophthalmic emulsion,  , Disp: , Rfl:    spironolactone (ALDACTONE) 25 MG tablet, Take 1 tablet (25 mg total) by mouth daily., Disp: 90 tablet, Rfl: 1   sulfamethoxazole-trimethoprim (BACTRIM DS) 800-160 MG tablet, Take 1 tablet by mouth 2 (two) times daily for 5 days., Disp: 10 tablet, Rfl: 0   EPINEPHrine 0.3 mg/0.3 mL IJ SOAJ injection, Inject 0.3 mg into the muscle as needed for anaphylaxis. (Patient not taking: Reported on 11/15/2022), Disp: 1 each, Rfl: 1  Review of Systems:  Negative unless indicated in HPI.   Physical Exam: Vitals:   11/15/22 1415  BP: 102/74  Pulse: 65  Temp: 98.4 F (36.9 C)  TempSrc: Oral  SpO2: 98%  Weight: 109 lb 1.6 oz (49.5 kg)    Body mass index is 19.33 kg/m.   Physical Exam Vitals reviewed.  Constitutional:      Appearance: Normal appearance.  HENT:     Head: Normocephalic and atraumatic.  Eyes:     Conjunctiva/sclera: Conjunctivae normal.     Pupils: Pupils are equal, round, and reactive to light.  Skin:    General: Skin is warm and dry.  Neurological:     General: No focal deficit present.     Mental Status: She is alert and oriented to person,  place, and time.  Psychiatric:        Mood and Affect: Mood normal.        Behavior: Behavior normal.        Thought Content: Thought content normal.        Judgment: Judgment normal.      Impression and Plan:  Vaginal itching - Plan: POCT Urinalysis Dipstick (Automated), Urine Culture, Urinalysis, Urinalysis, Urine Culture  Acute cystitis with hematuria - Plan: sulfamethoxazole-trimethoprim (BACTRIM DS) 800-160 MG tablet  Acne vulgaris - Plan: Ambulatory referral to Dermatology, spironolactone (ALDACTONE) 25 MG tablet  -Wet prep/Pap done today.  Hold off on further fluconazole pending results. -In office urine dipstick with blood.  Will treat empirically as a UTI with Bactrim, sent for urine culture. -For acne will give a prescription for spironolactone and place referral to dermatology per request.  Time  spent:31 minutes reviewing chart, interviewing and examining patient and formulating plan of care.     Lelon Frohlich, MD [facility]

## 2022-11-15 NOTE — Addendum Note (Signed)
Addended by: Westley Hummer B on: 11/15/2022 03:14 PM   Modules accepted: Orders

## 2022-11-16 LAB — URINE CULTURE
MICRO NUMBER:: 14783165
Result:: NO GROWTH
SPECIMEN QUALITY:: ADEQUATE

## 2022-11-19 LAB — CERVICOVAGINAL ANCILLARY ONLY
Bacterial Vaginitis (gardnerella): NEGATIVE
Candida Glabrata: NEGATIVE
Candida Vaginitis: NEGATIVE
Comment: NEGATIVE
Comment: NEGATIVE
Comment: NEGATIVE

## 2022-11-20 ENCOUNTER — Ambulatory Visit (INDEPENDENT_AMBULATORY_CARE_PROVIDER_SITE_OTHER): Payer: Managed Care, Other (non HMO)

## 2022-11-20 DIAGNOSIS — J309 Allergic rhinitis, unspecified: Secondary | ICD-10-CM

## 2022-11-21 ENCOUNTER — Ambulatory Visit: Payer: Managed Care, Other (non HMO) | Admitting: Internal Medicine

## 2022-11-21 LAB — CYTOLOGY - PAP
Adequacy: ABSENT
Chlamydia: NEGATIVE
Comment: NEGATIVE
Comment: NEGATIVE
Comment: NEGATIVE
Comment: NEGATIVE
Comment: NORMAL
Diagnosis: NEGATIVE
HSV1: NEGATIVE
HSV2: NEGATIVE
High risk HPV: NEGATIVE
Neisseria Gonorrhea: NEGATIVE
Trichomonas: NEGATIVE

## 2022-11-26 ENCOUNTER — Encounter: Payer: Self-pay | Admitting: Family Medicine

## 2022-11-26 ENCOUNTER — Ambulatory Visit: Payer: Managed Care, Other (non HMO) | Admitting: Family Medicine

## 2022-11-26 VITALS — BP 118/76 | HR 51 | Ht 63.0 in | Wt 112.4 lb

## 2022-11-26 DIAGNOSIS — M5416 Radiculopathy, lumbar region: Secondary | ICD-10-CM | POA: Diagnosis not present

## 2022-11-26 NOTE — Patient Instructions (Signed)
Thank you for coming in today.   If we cannot get the MRI approved for tomorrow we will probably do it this Weekend.  I ordered it to [facility] [phone]  Anticipate epidural steroid injection following the MRI.   If this can't get done by May 10 backup plan is prednisone and gabapentin to take with you.  Let me know how this goes.

## 2022-11-26 NOTE — Progress Notes (Signed)
Marie Payor, PhD, LAT, ATC acting as a scribe for Marie Graham, MD.  Marie Hanson is a 42 y.o. female who presents to [facility] today for exacerbation of her LBP. Pt was last seen for this issue by Dr. Denyse Amass on 06/18/22 and completed 5 visits focusing on her back. Today, pt reports worsening of sx more recently. Traveling back home in 3 weeks and has concerns about prolonged sitting while flying. Sx start to flare up after about 30 min of consecutive sitting. Some short-term improvement with PT. Had increased pain when taking a break from working out.   Leaving May 10 for [place].  Dx imaging: 06/18/22 L-spine & pelvis XR  Pertinent review of systems: No fevers or chills.  Relevant historical information: Raynaud's phenomenon   Exam:  BP 118/76   Pulse (!) 51   Ht 5\' 3"  (1.6 m)   Wt 112 lb 6.4 oz (51 kg)   SpO2 100%   BMI 19.91 kg/m  General: Well Developed, well nourished, and in no acute distress.   MSK: L-spine: Normal appearing  nontender palpation spinal midline. Normal lumbar motion. Lower extremity strength is intact Positive slump test bilaterally.    Lab and Radiology Results  EXAM: LUMBAR SPINE - 2-3 VIEW   COMPARISON:  None Available.   FINDINGS: There are 5 non-rib-bearing lumbar vertebra. Normal alignment. Normal vertebral body heights. Slight L5-S1 disc space narrowing, remaining disc spaces are preserved. Minor L4-L5 and L5-S1 facet hypertrophy. There is no evidence of fracture, focal bone lesion or pars defects. The sacroiliac joints are congruent.   IMPRESSION: 1. Slight L5-S1 disc space narrowing. 2. Minor lower lumbar facet hypertrophy.     Electronically Signed   By: Narda Rutherford M.D.   On: 06/20/2022 23:15   I, Marie Hanson, personally (independently) visualized and performed the interpretation of the images attached in this note.      Assessment and Plan: 42 y.o. female with  bilateral lumbosacral radiculopathy at S1 dermatomal pattern.  This is an acute exacerbation of a chronic problem.  She was seen for this issue June 18, 2022 and had physical therapy for 6 weeks and is continued home exercise program taught in this clinic and it physical therapy following physical therapies completion in January. Her symptoms persist and are worsening slightly with the lumbar sacral radicular component.  Plan for MRI for epidural steroid injection planning.  Anticipate direct order for epidural steroid injection following MRI.  She is leaving for [facility] May 10 for a week.  She is worried about her pain worsening during that trip which is a reasonable concern.  Will try to get this MRI and epidural steroid injection done before then.  If not my backup plan is prednisone and gabapentin on the plane.   PDMP not reviewed this encounter. Orders Placed This Encounter  Procedures   MR Lumbar Spine Wo Contrast    Standing Status:   Future    Standing Expiration Date:   11/26/2023    Order Specific Question:   What is the patient's sedation requirement?    Answer:   No Sedation    Order Specific Question:   Does the patient have a pacemaker or implanted devices?    Answer:   No    Order Specific Question:   Preferred imaging location?    Answer:   [facility] (table limit-350lbs)   No orders of the defined types were placed in this encounter.  Discussed warning signs or symptoms. Please see discharge instructions. Patient expresses understanding.   The above documentation has been reviewed and is accurate and complete Lynne Leader, M.D.

## 2022-11-27 ENCOUNTER — Ambulatory Visit (INDEPENDENT_AMBULATORY_CARE_PROVIDER_SITE_OTHER): Payer: Managed Care, Other (non HMO)

## 2022-11-27 DIAGNOSIS — M5416 Radiculopathy, lumbar region: Secondary | ICD-10-CM

## 2022-11-28 ENCOUNTER — Ambulatory Visit (INDEPENDENT_AMBULATORY_CARE_PROVIDER_SITE_OTHER): Payer: Managed Care, Other (non HMO)

## 2022-11-28 DIAGNOSIS — J309 Allergic rhinitis, unspecified: Secondary | ICD-10-CM | POA: Diagnosis not present

## 2022-11-29 ENCOUNTER — Encounter: Payer: Self-pay | Admitting: Internal Medicine

## 2022-11-29 DIAGNOSIS — L700 Acne vulgaris: Secondary | ICD-10-CM

## 2022-11-29 NOTE — Telephone Encounter (Signed)
Referral for GYN placed.  

## 2022-11-30 ENCOUNTER — Ambulatory Visit: Payer: Managed Care, Other (non HMO) | Admitting: Podiatry

## 2022-12-03 ENCOUNTER — Telehealth: Payer: Self-pay | Admitting: Family Medicine

## 2022-12-03 DIAGNOSIS — M5416 Radiculopathy, lumbar region: Secondary | ICD-10-CM

## 2022-12-03 NOTE — Progress Notes (Signed)
Lumbar spine MRI does show a bulging disc at L4-5 but with no clear nerve being pinched.  I have went ahead and ordered an epidural steroid injection which should help the pain in your low back and possibly pain going down your leg.  You should hear soon about scheduling from [facility] imaging.

## 2022-12-03 NOTE — Telephone Encounter (Signed)
Epidural steroid injection ordered 

## 2022-12-06 ENCOUNTER — Encounter: Payer: Self-pay | Admitting: Family Medicine

## 2022-12-06 ENCOUNTER — Other Ambulatory Visit: Payer: Self-pay

## 2022-12-06 ENCOUNTER — Ambulatory Visit (INDEPENDENT_AMBULATORY_CARE_PROVIDER_SITE_OTHER): Payer: Managed Care, Other (non HMO) | Admitting: Family Medicine

## 2022-12-06 VITALS — BP 120/82 | HR 60 | Temp 98.6°F | Resp 16 | Ht 63.0 in | Wt 109.7 lb

## 2022-12-06 DIAGNOSIS — J3089 Other allergic rhinitis: Secondary | ICD-10-CM | POA: Diagnosis not present

## 2022-12-06 DIAGNOSIS — J302 Other seasonal allergic rhinitis: Secondary | ICD-10-CM

## 2022-12-06 DIAGNOSIS — H1013 Acute atopic conjunctivitis, bilateral: Secondary | ICD-10-CM

## 2022-12-06 NOTE — Patient Instructions (Signed)
Allergic rhinitis Continue allergen avoidance measures directed toward mold, dust mite, cat, and cockroach as listed below Begin levocetirizine 5 mg once a day.  Increase levocetirizine to 5 mg twice a day on the day before and the day of your injection We will decrease your injection to the last tolerated dose and add an epinephrine wash You may receive your injection in your hip, abdomen, or arm  Call the clinic if this treatment plan is not working well for you  Follow up in 1 month or sooner if needed.

## 2022-12-06 NOTE — Progress Notes (Signed)
[address] [place] [address] Dept: [phone]  FOLLOW UP NOTE  Patient ID: Marie Hanson, female    DOB: 01/19/1981  Age: 42 y.o. MRN: [id number] Date of Office Visit: 12/06/2022  Assessment  Chief Complaint: Immunotherapy (Patient stated she would like to discuss other injection site options.)  HPI Marie Hanson is a 42 year old female who presents to the clinic for a follow-up visit and to discuss allergen immunotherapy.  She was initially seen in this clinic on 10/24/2022 by Dr. Maurine Minister for evaluation of allergic rhinitis, allergic conjunctivitis, nickel contact allergy, and recurrent hives.  She completed [facility] allergen immunotherapy on 11/13/2022.  She is accompanied by her husband who assists with history.  At today's visit, she reports that she has had large local reactions following the 2 doses of allergen immunotherapy that she received after completion of [facility] therapy.  She reports these reactions occur several hours after leaving the clinic and include red, itchy, swelling, and bruising.  She reports these reactions progress down her arms and rash also occurs on her fingers.  She continues to take cetirizine twice a day and has taken cetirizine for about the last 2 years.  She has tried other antihistamines including Claritin and Allegra which did not provide relief and Xyzal which made her sleepy.  She is interested in receiving these injections in an alternate subcutaneous area to evaluate any delayed response.  Of note: Next dose from green vial 0.025 and add epiwash- injection room notified  Her current medications are listed in the chart.  Drug Allergies:  No Known Allergies  Physical Exam: BP 120/82   Pulse 60   Temp 98.6 F (37 C) (Temporal)   Resp 16   Ht 5\' 3"  (1.6 m)   Wt 109 lb 11.2 oz (49.8 kg)   SpO2 100%   BMI 19.43 kg/m    Physical Exam Vitals reviewed.  Constitutional:      Appearance: Normal appearance.  HENT:     Head:  Normocephalic and atraumatic.     Right Ear: Tympanic membrane normal.     Left Ear: Tympanic membrane normal.     Nose:     Comments: Bilateral nares slightly erythematous with clear nasal drainage noted. Pharynx slightly erythematous with no exudate. Ears normal. Eyes normal. Eyes:     Conjunctiva/sclera: Conjunctivae normal.  Cardiovascular:     Rate and Rhythm: Normal rate and regular rhythm.     Heart sounds: Normal heart sounds. No murmur heard. Pulmonary:     Effort: Pulmonary effort is normal.     Breath sounds: Normal breath sounds.     Comments: Lungs clear to auscultation Musculoskeletal:        General: Normal range of motion.     Cervical back: Normal range of motion and neck supple.  Skin:    Comments: Patient with recent facial peel. Skin on face hypopigmented and peeling  Neurological:     Mental Status: She is alert and oriented to person, place, and time.  Psychiatric:        Mood and Affect: Mood normal.        Behavior: Behavior normal.        Thought Content: Thought content normal.        Judgment: Judgment normal.     Assessment and Plan: 1. Seasonal and perennial allergic rhinitis   2. Allergic conjunctivitis of both eyes     Patient Instructions  Allergic rhinitis Continue allergen avoidance measures directed toward  mold, dust mite, cat, and cockroach as listed below Begin levocetirizine 5 mg once a day.  Increase levocetirizine to 5 mg twice a day on the day before and the day of your injection We will decrease your injection to the last tolerated dose and add an epinephrine wash You may receive your injection in your hip, abdomen, or arm  Call the clinic if this treatment plan is not working well for you  Follow up in 1 month or sooner if needed.    Return in about 4 weeks (around 01/03/2023), or if symptoms worsen or fail to improve.    Thank you for the opportunity to care for this patient.  Please do not hesitate to contact me with  questions.  Thermon Leyland, FNP [facility]

## 2022-12-07 ENCOUNTER — Encounter: Payer: Self-pay | Admitting: Family Medicine

## 2022-12-07 ENCOUNTER — Ambulatory Visit (INDEPENDENT_AMBULATORY_CARE_PROVIDER_SITE_OTHER): Payer: Managed Care, Other (non HMO)

## 2022-12-07 DIAGNOSIS — J309 Allergic rhinitis, unspecified: Secondary | ICD-10-CM

## 2022-12-10 ENCOUNTER — Telehealth: Payer: Managed Care, Other (non HMO) | Admitting: Physician Assistant

## 2022-12-10 DIAGNOSIS — B369 Superficial mycosis, unspecified: Secondary | ICD-10-CM | POA: Diagnosis not present

## 2022-12-10 DIAGNOSIS — L700 Acne vulgaris: Secondary | ICD-10-CM

## 2022-12-10 MED ORDER — FLUCONAZOLE 150 MG PO TABS
150.0000 mg | ORAL_TABLET | ORAL | 0 refills | Status: DC | PRN
Start: 2022-12-10 — End: 2022-12-24

## 2022-12-10 MED ORDER — CLOTRIMAZOLE-BETAMETHASONE 1-0.05 % EX CREA
1.0000 | TOPICAL_CREAM | Freq: Every day | CUTANEOUS | 0 refills | Status: DC
Start: 2022-12-10 — End: 2022-12-10

## 2022-12-10 MED ORDER — CLINDAMYCIN PHOSPHATE 1 % EX GEL
Freq: Two times a day (BID) | CUTANEOUS | 0 refills | Status: DC
Start: 2022-12-10 — End: 2023-01-22

## 2022-12-10 MED ORDER — CLOTRIMAZOLE 1 % EX CREA
1.0000 | TOPICAL_CREAM | Freq: Two times a day (BID) | CUTANEOUS | 0 refills | Status: DC
Start: 2022-12-10 — End: 2023-01-02

## 2022-12-10 NOTE — Progress Notes (Signed)
Message sent to patient requesting further input regarding current symptoms. Awaiting patient response.  

## 2022-12-10 NOTE — Patient Instructions (Signed)
[name], thank you for joining [name], PA-C for today's virtual visit.  While this provider is not your primary care provider (PCP), if your PCP is located in our provider database this encounter information will be shared with them immediately following your visit.   A [facility] MyChart account gives you access to today's visit and all your visits, tests, and labs performed at [facility] " click here if you don't have a [facility] MyChart account or go to [email or web address]  Consent: (Patient) Marie Hanson provided verbal consent for this virtual visit at the beginning of the encounter.  Current Medications:  Current Outpatient Medications:    clindamycin (CLINDAGEL) 1 % gel, Apply topically 2 (two) times daily., Disp: 30 g, Rfl: 0   clotrimazole (LOTRIMIN) 1 % cream, Apply 1 Application topically 2 (two) times daily., Disp: 30 g, Rfl: 0   fluconazole (DIFLUCAN) 150 MG tablet, Take 1 tablet (150 mg total) by mouth every 3 (three) days as needed., Disp: 2 tablet, Rfl: 0   ADZENYS XR-ODT 6.3 MG TBED, Take 1 tablet by mouth daily., Disp: , Rfl:    Azelaic Acid 15 % gel, AFTER SKIN IS THOROUGHLY WASHED AND PATTED DRY, GENTLY AND THOROUGHLY APPLY A THIN FILM TO AFFECTED AREA IN THE MORNING AND AT NIGHT, Disp: 50 g, Rfl: 2   Azelastine HCl 137 MCG/SPRAY SOLN, Place into both nostrils., Disp: , Rfl:    doxycycline (VIBRAMYCIN) 50 MG capsule, , Disp: , Rfl:    drospirenone-ethinyl estradiol ([name]) 3-0.02 MG tablet, Take 1 tablet by mouth daily., Disp: 84 tablet, Rfl: 1   Multiple Vitamin (MULTIVITAMIN) tablet, Take 1 tablet by mouth daily., Disp: , Rfl:    Omega-3 Fatty Acids (FISH OIL) 1000 MG CAPS, Take by mouth., Disp: , Rfl:    RESTASIS 0.05 % ophthalmic emulsion, , Disp: , Rfl:    spironolactone (ALDACTONE) 25 MG tablet, Take 1 tablet (25 mg total) by mouth daily., Disp: 90 tablet, Rfl: 1   tretinoin (RETIN-A) 0.025 % cream, Apply 1  Application topically at bedtime., Disp: , Rfl:    Medications ordered in this encounter:  Meds ordered this encounter  Medications   DISCONTD: clotrimazole-betamethasone (LOTRISONE) cream    Sig: Apply 1 Application topically daily.    Dispense:  30 g    Refill:  0    Order Specific Question:   Supervising Provider    Answer:   Merrilee Jansky [id number]   clindamycin (CLINDAGEL) 1 % gel    Sig: Apply topically 2 (two) times daily.    Dispense:  30 g    Refill:  0    Order Specific Question:   Supervising Provider    Answer:   Merrilee Jansky [[id number]]   fluconazole (DIFLUCAN) 150 MG tablet    Sig: Take 1 tablet (150 mg total) by mouth every 3 (three) days as needed.    Dispense:  2 tablet    Refill:  0    Order Specific Question:   Supervising Provider    Answer:   Merrilee Jansky [id number]   clotrimazole (LOTRIMIN) 1 % cream    Sig: Apply 1 Application topically 2 (two) times daily.    Dispense:  30 g    Refill:  0    Please replace lotrisone, sent in error. Wanted Clotrimazole alone    Order Specific Question:   Supervising Provider    Answer:   Merrilee Jansky [[id number]]     *If  you need refills on other medications prior to your next appointment, please contact your pharmacy*  Follow-Up: Call back or seek an in-person evaluation if the symptoms worsen or if the condition fails to improve as anticipated.  [facility] [phone]   If you have been instructed to have an in-person evaluation today at a local Urgent Care facility, please use the link below. It will take you to a list of all of our available [facility], including address, phone number and hours of operation. Please do not delay care.  [facility]  If you or a family member do not have a primary care provider, use the link below to schedule a visit and establish care. When you choose a [facility] primary care physician or advanced practice provider, you gain  a long-term partner in health. Find a Primary Care Provider  Learn more about [facility] in-office and virtual care options: [facility] - Get Care Now

## 2022-12-10 NOTE — Progress Notes (Signed)
Virtual Visit Consent   Marie Hanson, you are scheduled for a virtual visit with a [facility] provider today. Just as with appointments in the office, your consent must be obtained to participate. Your consent will be active for this visit and any virtual visit you may have with one of our providers in the next 365 days. If you have a MyChart account, a copy of this consent can be sent to you electronically.  As this is a virtual visit, video technology does not allow for your provider to perform a traditional examination. This may limit your provider's ability to fully assess your condition. If your provider identifies any concerns that need to be evaluated in person or the need to arrange testing (such as labs, EKG, etc.), we will make arrangements to do so. Although advances in technology are sophisticated, we cannot ensure that it will always work on either your end or our end. If the connection with a video visit is poor, the visit may have to be switched to a telephone visit. With either a video or telephone visit, we are not always able to ensure that we have a secure connection.  By engaging in this virtual visit, you consent to the provision of healthcare and authorize for your insurance to be billed (if applicable) for the services provided during this visit. Depending on your insurance coverage, you may receive a charge related to this service.  I need to obtain your verbal consent now. Are you willing to proceed with your visit today? Marie Hanson has provided verbal consent on 12/10/2022 for a virtual visit (video or telephone). [name], PA-C  Date: 12/10/2022 11:06 AM  Virtual Visit via Video Note   I, [name], connected with  Marie Hanson  ([id number], 09-19-1980) on 12/10/22 at 10:30 AM EDT by a video-enabled telemedicine application and verified that I am speaking with the correct person using two identifiers.  Location: Patient:  Virtual Visit Location Patient: Home Provider: Virtual Visit Location Provider: Home Office   I discussed the limitations of evaluation and management by telemedicine and the availability of in person appointments. The patient expressed understanding and agreed to proceed.    History of Present Illness: Marie Hanson is a 42 y.o. who identifies as a female who was assigned female at birth, and is being seen today for rash on face following chemical peel.  HPI: Rash This is a new problem. The current episode started in the past 7 days (Had a chemical peel 11 days ago and having an outbreak on her forehead and right cheek). The problem is unchanged. The affected locations include the face. The rash is characterized by redness and itchiness (pustular and papular lesions). She was exposed to chemicals. Pertinent negatives include no congestion, diarrhea, eye pain, facial edema, fatigue, fever, joint pain, nail changes, shortness of breath or sore throat. Past treatments include moisturizer. The treatment provided no relief.     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   Raynaud's phenomenon 06/18/2022   Rosacea 03/05/2019   Anxiety 03/05/2019    Allergies: No Known Allergies Medications:  Current Outpatient Medications:    clindamycin (CLINDAGEL) 1 % gel, Apply topically 2 (two) times daily., Disp: 30 g, Rfl: 0   clotrimazole (LOTRIMIN) 1 % cream, Apply 1 Application topically 2 (two) times daily.,  Disp: 30 g, Rfl: 0   fluconazole (DIFLUCAN) 150 MG tablet, Take 1 tablet (150 mg total) by mouth every 3 (three) days as needed., Disp: 2 tablet, Rfl: 0   ADZENYS XR-ODT 6.3 MG TBED, Take 1 tablet by mouth daily., Disp: , Rfl:    Azelaic Acid 15 % gel, AFTER SKIN IS THOROUGHLY WASHED AND PATTED DRY, GENTLY AND THOROUGHLY APPLY A THIN FILM  TO AFFECTED AREA IN THE MORNING AND AT NIGHT, Disp: 50 g, Rfl: 2   Azelastine HCl 137 MCG/SPRAY SOLN, Place into both nostrils., Disp: , Rfl:    doxycycline (VIBRAMYCIN) 50 MG capsule, , Disp: , Rfl:    drospirenone-ethinyl estradiol ([name]) 3-0.02 MG tablet, Take 1 tablet by mouth daily., Disp: 84 tablet, Rfl: 1   Multiple Vitamin (MULTIVITAMIN) tablet, Take 1 tablet by mouth daily., Disp: , Rfl:    Omega-3 Fatty Acids (FISH OIL) 1000 MG CAPS, Take by mouth., Disp: , Rfl:    RESTASIS 0.05 % ophthalmic emulsion, , Disp: , Rfl:    spironolactone (ALDACTONE) 25 MG tablet, Take 1 tablet (25 mg total) by mouth daily., Disp: 90 tablet, Rfl: 1   tretinoin (RETIN-A) 0.025 % cream, Apply 1 Application topically at bedtime., Disp: , Rfl:   Observations/Objective: Patient is well-developed, well-nourished in no acute distress.  Resting comfortably at home.  Head is normocephalic, atraumatic.  No labored breathing.  Speech is clear and coherent with logical content.  Patient is alert and oriented at baseline.  Pustular and papular lesions located in a small area centrally on the forehead on an erythematous base that has a well demarcated, irregular border Similar area noted on the right cheek that seems more persistent with acne  Assessment and Plan: 1. Fungal dermatitis - clindamycin (CLINDAGEL) 1 % gel; Apply topically 2 (two) times daily.  Dispense: 30 g; Refill: 0 - fluconazole (DIFLUCAN) 150 MG tablet; Take 1 tablet (150 mg total) by mouth every 3 (three) days as needed.  Dispense: 2 tablet; Refill: 0 - clotrimazole (LOTRIMIN) 1 % cream; Apply 1 Application topically 2 (two) times daily.  Dispense: 30 g; Refill: 0  - Suspected fungal infection on forehead with acne following chemical peel on the right cheek - Clotrimazole for forehead - Clindamycin gel for cheek acne - Fluconazole for possible fungal infection - Moisturize well - Seek in person evaluation if not improving or if symptoms  worsen  Follow Up Instructions: I discussed the assessment and treatment plan with the patient. The patient was provided an opportunity to ask questions and all were answered. The patient agreed with the plan and demonstrated an understanding of the instructions.  A copy of instructions were sent to the patient via MyChart unless otherwise noted below.    The patient was advised to call back or seek an in-person evaluation if the symptoms worsen or if the condition fails to improve as anticipated.  Time:  I spent 10 minutes with the patient via telehealth technology discussing the above problems/concerns.    [name], PA-C

## 2022-12-10 NOTE — Progress Notes (Signed)
Because of a breakout of symptoms despite being on an oral medication that manages acne (doxycycline) and topical tretinoin, I feel your condition warrants further evaluation and I recommend that you follow-up with your PCP/Dermatologist for further management.   NOTE: There will be NO CHARGE for this eVisit   If you are having a true medical emergency please call 911.      For an urgent face to face visit, [facility] has eight urgent care centers for your convenience:   NEW!! [facility] Health Urgent Care Center at [facility] Get Driving Directions [phone] [address], [address] [place], [address]    [facility] Health Urgent Care Center at [facility] Get Driving Directions [phone] [address] [place], [place] [address]   [facility] Health Urgent Care Center [facility]) Get Driving Directions [phone] [address] [place], [place] [address]  [facility] Center [facility]) Get Driving Directions [phone] [address] [place],  [place]  [address]  [facility] Health Urgent Care Center [facility] - at [employer]  [phone] [place],  [place] [address]   [facility] Health Urgent Care at [facility] Get Driving Directions [phone] [address] [place], [place] [address]   [facility] Health Urgent Care at [facility] Get Driving Directions  [phone] [address] [place], [place] [address]   [facility] Health Urgent Care at [facility] Directions [phone] [address] [place], [place] [address]  Your MyChart E-visit questionnaire answers were reviewed by a board certified advanced clinical practitioner to complete your personal care plan based on your specific symptoms.  Thank you for using e-Visits.

## 2022-12-11 ENCOUNTER — Ambulatory Visit
Admission: RE | Admit: 2022-12-11 | Discharge: 2022-12-11 | Disposition: A | Discharge status: Home / Self Care | Payer: Managed Care, Other (non HMO) | Source: Ambulatory Visit | Attending: Family Medicine | Admitting: Family Medicine

## 2022-12-11 DIAGNOSIS — M5416 Radiculopathy, lumbar region: Secondary | ICD-10-CM

## 2022-12-11 MED ORDER — METHYLPREDNISOLONE ACETATE 40 MG/ML INJ SUSP (RADIOLOG
80.0000 mg | Freq: Once | INTRAMUSCULAR | Status: AC
  Administered 2022-12-11: 80 mg via EPIDURAL

## 2022-12-11 MED ORDER — IOPAMIDOL (ISOVUE-M 200) INJECTION 41%
1.0000 mL | Freq: Once | INTRAMUSCULAR | Status: AC
  Administered 2022-12-11: 1 mL via EPIDURAL

## 2022-12-11 NOTE — Discharge Instructions (Signed)

## 2022-12-14 ENCOUNTER — Encounter: Payer: Self-pay | Admitting: Podiatry

## 2022-12-14 ENCOUNTER — Ambulatory Visit (INDEPENDENT_AMBULATORY_CARE_PROVIDER_SITE_OTHER): Payer: Managed Care, Other (non HMO)

## 2022-12-14 ENCOUNTER — Ambulatory Visit (INDEPENDENT_AMBULATORY_CARE_PROVIDER_SITE_OTHER): Payer: Managed Care, Other (non HMO) | Admitting: Podiatry

## 2022-12-14 DIAGNOSIS — M7751 Other enthesopathy of right foot: Secondary | ICD-10-CM | POA: Diagnosis not present

## 2022-12-14 DIAGNOSIS — M722 Plantar fascial fibromatosis: Secondary | ICD-10-CM

## 2022-12-14 DIAGNOSIS — J309 Allergic rhinitis, unspecified: Secondary | ICD-10-CM | POA: Diagnosis not present

## 2022-12-14 MED ORDER — MELOXICAM 15 MG PO TABS
15.0000 mg | ORAL_TABLET | Freq: Every day | ORAL | 0 refills | Status: DC
Start: 2022-12-14 — End: 2023-01-22

## 2022-12-14 NOTE — Progress Notes (Signed)
  Subjective:  Patient ID: Marie Hanson, female    DOB: 09/03/1980,   MRN: [id number]  Chief Complaint  Patient presents with   Ankle Pain    No changes with her ankle pain   Plantar Fasciitis    Patient states PF pain is much better since the last visit     42 y.o. female presents for concern of pain in her arch, achilles area and around her ankle. Relates pain and numbness. Relates this issue has been going on for about the 8 months but in last few weeks it has worsened. She is very active. Relates she has seen sports medicine and was doing physical therapy and had been helping.   . Denies any other pedal complaints. Denies n/v/f/c.   Past Medical History:  Diagnosis Date   Anxiety    Depression    Eczema    Nipple pain x's 2 months   Rosacea    Urticaria     Objective:  Physical Exam: Vascular: DP/PT pulses 2/4 bilateral. CFT <3 seconds. Normal hair growth on digits. No edema.  Skin. No lacerations or abrasions bilateral feet.  Musculoskeletal: MMT 5/5 bilateral lower extremities in DF, PF, Inversion and Eversion. Deceased ROM in DF of ankle joint. Tender to central calcaneal tubercle upon palpation. No pain along PT achilles or in arch. Tenderness to anterior ankle joint line. No pain with MMT.  Neurological: Sensation intact to light touch.   Assessment:   1. Plantar fasciitis, right   2. Capsulitis of right ankle       Plan:  Patient was evaluated and treated and all questions answered. X-rays reviewed and discussed with patient. No acute fractures or dislocations noted.  No acute fractures or dislocations. Mild haglunds deformity and small os tibial externum noted.  Reviewed notes from sports medicine Discussed plantar fasciitis with patient.  Appears that some of her plantar foot pain could be coming from this. Some of her pain is likely lumbar radiculopathy. The ankle pain is unclear and could be some nerve irritation vs some capsultisi of the ankle.   Discussed treatment options including, ice, NSAIDS, supportive shoes, bracing, and stretching. Continue stretching and bracing.  Meloxicam refilled.  [facility] orthotics dispensed today and instructed on use.  Discussed if continued pain over the top of the foot could try a steroid injection.  Follow-up as needed.   Louann Sjogren, DPM

## 2022-12-18 ENCOUNTER — Other Ambulatory Visit: Payer: Managed Care, Other (non HMO)

## 2022-12-24 ENCOUNTER — Ambulatory Visit (INDEPENDENT_AMBULATORY_CARE_PROVIDER_SITE_OTHER): Payer: Managed Care, Other (non HMO) | Admitting: *Deleted

## 2022-12-24 ENCOUNTER — Telehealth: Payer: Managed Care, Other (non HMO) | Admitting: Physician Assistant

## 2022-12-24 DIAGNOSIS — J309 Allergic rhinitis, unspecified: Secondary | ICD-10-CM | POA: Diagnosis not present

## 2022-12-24 DIAGNOSIS — B369 Superficial mycosis, unspecified: Secondary | ICD-10-CM

## 2022-12-24 MED ORDER — KETOCONAZOLE 2 % EX SHAM
1.0000 | MEDICATED_SHAMPOO | CUTANEOUS | 0 refills | Status: DC
Start: 2022-12-24 — End: 2023-01-02

## 2022-12-24 MED ORDER — FLUCONAZOLE 150 MG PO TABS
150.0000 mg | ORAL_TABLET | ORAL | 0 refills | Status: DC | PRN
Start: 2022-12-24 — End: 2023-01-22

## 2022-12-24 NOTE — Patient Instructions (Signed)
[name], thank you for joining [name], PA-C for today's virtual visit.  While this provider is not your primary care provider (PCP), if your PCP is located in our provider database this encounter information will be shared with them immediately following your visit.   A [facility] MyChart account gives you access to today's visit and all your visits, tests, and labs performed at [facility] " click here if you don't have a [facility] MyChart account or go to [email or web address]  Consent: (Patient) Marie Hanson provided verbal consent for this virtual visit at the beginning of the encounter.  Current Medications:  Current Outpatient Medications:    ADZENYS XR-ODT 6.3 MG TBED, Take 1 tablet by mouth daily., Disp: , Rfl:    Azelaic Acid 15 % gel, AFTER SKIN IS THOROUGHLY WASHED AND PATTED DRY, GENTLY AND THOROUGHLY APPLY A THIN FILM TO AFFECTED AREA IN THE MORNING AND AT NIGHT, Disp: 50 g, Rfl: 2   Azelastine HCl 137 MCG/SPRAY SOLN, Place into both nostrils., Disp: , Rfl:    clindamycin (CLINDAGEL) 1 % gel, Apply topically 2 (two) times daily., Disp: 30 g, Rfl: 0   clotrimazole (LOTRIMIN) 1 % cream, Apply 1 Application topically 2 (two) times daily., Disp: 30 g, Rfl: 0   doxycycline (VIBRAMYCIN) 50 MG capsule, , Disp: , Rfl:    drospirenone-ethinyl estradiol ([name]) 3-0.02 MG tablet, Take 1 tablet by mouth daily., Disp: 84 tablet, Rfl: 1   fluconazole (DIFLUCAN) 150 MG tablet, Take 1 tablet (150 mg total) by mouth every 3 (three) days as needed., Disp: 4 tablet, Rfl: 0   ketoconazole (NIZORAL) 2 % shampoo, Apply 1 Application topically 3 (three) times a week., Disp: 120 mL, Rfl: 0   meloxicam (MOBIC) 15 MG tablet, Take 1 tablet (15 mg total) by mouth daily., Disp: 30 tablet, Rfl: 0   Multiple Vitamin (MULTIVITAMIN) tablet, Take 1 tablet by mouth daily., Disp: , Rfl:    Omega-3 Fatty Acids (FISH OIL) 1000 MG CAPS, Take by mouth., Disp: , Rfl:     RESTASIS 0.05 % ophthalmic emulsion, , Disp: , Rfl:    spironolactone (ALDACTONE) 25 MG tablet, Take 1 tablet (25 mg total) by mouth daily., Disp: 90 tablet, Rfl: 1   tretinoin (RETIN-A) 0.025 % cream, Apply 1 Application topically at bedtime., Disp: , Rfl:    Medications ordered in this encounter:  Meds ordered this encounter  Medications   fluconazole (DIFLUCAN) 150 MG tablet    Sig: Take 1 tablet (150 mg total) by mouth every 3 (three) days as needed.    Dispense:  4 tablet    Refill:  0    Order Specific Question:   Supervising Provider    Answer:   Merrilee Jansky [[id number]]   ketoconazole (NIZORAL) 2 % shampoo    Sig: Apply 1 Application topically 3 (three) times a week.    Dispense:  120 mL    Refill:  0    Order Specific Question:   Supervising Provider    Answer:   Merrilee Jansky [[id number]]     *If you need refills on other medications prior to your next appointment, please contact your pharmacy*  Follow-Up: Call back or seek an in-person evaluation if the symptoms worsen or if the condition fails to improve as anticipated.  [facility] [phone]  Other Instructions  Fluconazole Tablets What is this medication? FLUCONAZOLE (floo KON na zole) prevents and treats fungal or yeast infections. It  belongs to a group of medications called antifungals. It will not prevent or treat colds, the flu, or infections caused by bacteria or viruses. This medicine may be used for other purposes; ask your health care provider or pharmacist if you have questions. COMMON BRAND NAME(S): Diflucan What should I tell my care team before I take this medication? They need to know if you have any of these conditions: Irregular heartbeat or rhythm Kidney disease Liver disease Low levels of potassium in the blood An unusual or allergic reaction to fluconazole, other medications, foods, dyes, or preservatives Pregnant or trying to get pregnant Breastfeeding How should I  use this medication? Take this medication by mouth. Follow the directions on the prescription label. Do not take your medication more often than directed. Talk to your care team about the use of this medication in children. Special care may be needed. This medication has been used in children as young as 36 months of age. Overdosage: If you think you have taken too much of this medicine contact a poison control center or emergency room at once. NOTE: This medicine is only for you. Do not share this medicine with others. What if I miss a dose? If you miss a dose, take it as soon as you can. If it is almost time for your next dose, take only that dose. Do not take double or extra doses. What may interact with this medication? Do not take this medication with any of the following: Adagrasib Flibanserin Lomitapide Lonafarnib Other medications that cause heart rhythm changes Triazolam This medication may also interact with the following: Abrocitinib Certain antibiotics, such as rifabutin or rifampin Certain antivirals for HIV or hepatitis Certain medications for blood pressure, heart disease, irregular heartbeat Certain medications for cholesterol, such as atorvastatin, lovastatin, simvastatin Certain medications for depression, such as amitriptyline or nortriptyline Certain medications for diabetes, such as glipizide or glyburide Certain medications for seizures, such as carbamazepine or phenytoin Certain medications that treat or prevent blood clots, such as warfarin Certain opioid medications for pain, such as alfentanil, fentanyl, methadone Cyclophosphamide Cyclosporine Ibrutinib Lemborexant Midazolam NSAIDS, medications for pain and inflammation, such as ibuprofen or naproxen Olaparib Sirolimus Steroid medications, such as prednisone Tacrolimus Theophylline Tofacitinib Tolvaptan Vinblastine Vincristine Vitamin A Voriconazole This list may not describe all possible  interactions. Give your health care provider a list of all the medicines, herbs, non-prescription drugs, or dietary supplements you use. Also tell them if you smoke, drink alcohol, or use illegal drugs. Some items may interact with your medicine. What should I watch for while using this medication? Visit your care team for regular checkups. If you are taking this medication for a long time you may need blood work. Tell your care team if your symptoms do not improve. Some fungal infections need many weeks or months of treatment to cure. Alcohol can increase possible damage to your liver. Avoid alcoholic drinks. If you have a vaginal infection, do not have sex until you have finished your treatment. You can wear a sanitary napkin. Do not use tampons. Wear freshly washed cotton, not synthetic, panties. What side effects may I notice from receiving this medication? Side effects that you should report to your care team as soon as possible: Allergic reactions--skin rash, itching, hives, swelling of the face, lips, tongue, or throat Heart rhythm changes--fast or irregular heartbeat, dizziness, feeling faint or lightheaded, chest pain, trouble breathing Liver injury--right upper belly pain, loss of appetite, nausea, light-colored stool, dark yellow or brown urine, yellowing  skin or eyes, unusual weakness or fatigue Low adrenal gland function--nausea, vomiting, loss of appetite, unusual weakness or fatigue, dizziness Rash, fever, and swollen lymph nodes Redness, blistering, peeling, or loosening of the skin, including inside the mouth Seizures Side effects that usually do not require medical attention (report to your care team if they continue or are bothersome): Change in taste Diarrhea Dizziness Headache Nausea Stomach pain This list may not describe all possible side effects. Call your doctor for medical advice about side effects. You may report side effects to FDA at 1-800-FDA-1088. Where should I  keep my medication? Keep out of the reach of children. Store at room temperature below 30 degrees C (86 degrees F). Throw away any medication after the expiration date. NOTE: This sheet is a summary. It may not cover all possible information. If you have questions about this medicine, talk to your doctor, pharmacist, or health care provider.  2023 Elsevier/Gold Standard (2020-10-13 00:00:00)    If you have been instructed to have an in-person evaluation today at a local Urgent Care facility, please use the link below. It will take you to a list of all of our available [facility], including address, phone number and hours of operation. Please do not delay care.  [facility]  If you or a family member do not have a primary care provider, use the link below to schedule a visit and establish care. When you choose a [facility] primary care physician or advanced practice provider, you gain a long-term partner in health. Find a Primary Care Provider  Learn more about [facility]'s in-office and virtual care options: [facility] - Get Care Now

## 2022-12-24 NOTE — Progress Notes (Signed)
Virtual Visit Consent   Marie Hanson, you are scheduled for a virtual visit with a [facility] provider today. Just as with appointments in the office, your consent must be obtained to participate. Your consent will be active for this visit and any virtual visit you may have with one of our providers in the next 365 days. If you have a MyChart account, a copy of this consent can be sent to you electronically.  As this is a virtual visit, video technology does not allow for your provider to perform a traditional examination. This may limit your provider's ability to fully assess your condition. If your provider identifies any concerns that need to be evaluated in person or the need to arrange testing (such as labs, EKG, etc.), we will make arrangements to do so. Although advances in technology are sophisticated, we cannot ensure that it will always work on either your end or our end. If the connection with a video visit is poor, the visit may have to be switched to a telephone visit. With either a video or telephone visit, we are not always able to ensure that we have a secure connection.  By engaging in this virtual visit, you consent to the provision of healthcare and authorize for your insurance to be billed (if applicable) for the services provided during this visit. Depending on your insurance coverage, you may receive a charge related to this service.  I need to obtain your verbal consent now. Are you willing to proceed with your visit today? Marie Hanson has provided verbal consent on 12/24/2022 for a virtual visit (video or telephone). [name], PA-C  Date: 12/24/2022 1:06 PM  Virtual Visit via Video Note   I, [name], connected with  Marie Hanson  ([id number], 1980-08-16) on 12/24/22 at  1:00 PM EDT by a video-enabled telemedicine application and verified that I am speaking with the correct person using two identifiers.  Location: Patient:  Virtual Visit Location Patient: Home Provider: Virtual Visit Location Provider: Home Office   I discussed the limitations of evaluation and management by telemedicine and the availability of in person appointments. The patient expressed understanding and agreed to proceed.    History of Present Illness: Marie Hanson is a 42 y.o. who identifies as a female who was assigned female at birth, and is being seen today for possible fungal dermatitis. Seen virtually on 12/10/22. Given Fluconazole, Clotrimazole, and Clindamycin. She has continued to spot treat with clotrimazole but felt it cleared the most with Fluconazole. She cannot see her Dermatologist until July. She does have a follow up with her PCP next week. 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   Raynaud's phenomenon 06/18/2022   Rosacea 03/05/2019   Anxiety 03/05/2019    Allergies: No Known Allergies Medications:  Current Outpatient Medications:    ADZENYS XR-ODT 6.3 MG TBED, Take 1 tablet by mouth daily., Disp: , Rfl:    Azelaic Acid 15 % gel, AFTER SKIN IS THOROUGHLY WASHED AND PATTED DRY, GENTLY AND THOROUGHLY APPLY A THIN FILM TO AFFECTED AREA IN THE MORNING AND AT NIGHT, Disp: 50 g, Rfl: 2   Azelastine HCl 137 MCG/SPRAY SOLN, Place into both nostrils., Disp: , Rfl:    clindamycin (CLINDAGEL) 1 % gel, Apply topically 2 (two) times  daily., Disp: 30 g, Rfl: 0   clotrimazole (LOTRIMIN) 1 % cream, Apply 1 Application topically 2 (two) times daily., Disp: 30 g, Rfl: 0   doxycycline (VIBRAMYCIN) 50 MG capsule, , Disp: , Rfl:    drospirenone-ethinyl estradiol ([name]) 3-0.02 MG tablet, Take 1 tablet by mouth daily., Disp: 84 tablet, Rfl: 1   fluconazole (DIFLUCAN) 150 MG tablet, Take 1 tablet (150 mg total) by mouth every 3  (three) days as needed., Disp: 4 tablet, Rfl: 0   ketoconazole (NIZORAL) 2 % shampoo, Apply 1 Application topically 3 (three) times a week., Disp: 120 mL, Rfl: 0   meloxicam (MOBIC) 15 MG tablet, Take 1 tablet (15 mg total) by mouth daily., Disp: 30 tablet, Rfl: 0   Multiple Vitamin (MULTIVITAMIN) tablet, Take 1 tablet by mouth daily., Disp: , Rfl:    Omega-3 Fatty Acids (FISH OIL) 1000 MG CAPS, Take by mouth., Disp: , Rfl:    RESTASIS 0.05 % ophthalmic emulsion, , Disp: , Rfl:    spironolactone (ALDACTONE) 25 MG tablet, Take 1 tablet (25 mg total) by mouth daily., Disp: 90 tablet, Rfl: 1   tretinoin (RETIN-A) 0.025 % cream, Apply 1 Application topically at bedtime., Disp: , Rfl:   Observations/Objective: Patient is well-developed, well-nourished in no acute distress.  Resting comfortably at home.  Head is normocephalic, atraumatic.  No labored breathing. Speech is clear and coherent with logical content.  Patient is alert and oriented at baseline.  Areas are not clearly visible on video, but patient reports one cluster just above right eyebrow, one on right forehead near hairline, around eyebrows, and one on left forehead, also reports around nose  Assessment and Plan: 1. Fungal dermatitis - fluconazole (DIFLUCAN) 150 MG tablet; Take 1 tablet (150 mg total) by mouth every 3 (three) days as needed.  Dispense: 4 tablet; Refill: 0 - ketoconazole (NIZORAL) 2 % shampoo; Apply 1 Application topically 3 (three) times a week.  Dispense: 120 mL; Refill: 0  - Will extend fluconazole treatment for 12 days - Ketoconazole shampoo to use as a face wash 2-3 times per week - Moisturize well - Seek in person evaluation if not improving to confirm diagnosis   Follow Up Instructions: I discussed the assessment and treatment plan with the patient. The patient was provided an opportunity to ask questions and all were answered. The patient agreed with the plan and demonstrated an understanding of the  instructions.  A copy of instructions were sent to the patient via MyChart unless otherwise noted below.    The patient was advised to call back or seek an in-person evaluation if the symptoms worsen or if the condition fails to improve as anticipated.  Time:  I spent minutes with the patient via telehealth technology discussing the above problems/concerns.    [name], PA-C

## 2022-12-28 ENCOUNTER — Other Ambulatory Visit: Payer: Self-pay | Admitting: Internal Medicine

## 2022-12-28 DIAGNOSIS — Z3041 Encounter for surveillance of contraceptive pills: Secondary | ICD-10-CM

## 2023-01-01 NOTE — Progress Notes (Unsigned)
FOLLOW UP Date of Service/Encounter:  01/02/23   Subjective:  Marie Hanson (DOB: June 03, 1981) is a 42 y.o. female who [facility] in re-evaluation of the following: allergic rhinitis and conjunctivitis, chronic urticaria, eczema History obtained from: chart review and patient.  For Review, LV was on 12/06/22  with Thermon Leyland, FNP seen for acute visit for rash following AIT . See below for summary of history and diagnostics.  Therapeutic plans/changes recommended: She was backed down to previously tolerated dose of AIT and epirinses were added. She was switched to xyzal.   Pertinent History/Diagnostics:  Allergic Rhinitis:  Congestion and pain, worse on right. + itchy eyes with occasional drainage. Recent ENT visit with normal rhinoscopy. Moved to [place] 3.5 years prior. Symptoms worse in spring and fall.  Horse has been a trigger.  Has history of nasal cauterization. RUSH AIT on 11/13/22. - SPT environmental panel (10/24/22): positive to grass pollen, weed pollen, tree pollen, indoor/outdoor molds, dog, horse, tobacco leaf -intradermal testing positive to cat and ragweed Eczema: occasionally, flares last year around her nipple. Normal mammogram. She did see a dermatologist and was given a non-steroid cream that didn't work. Eucerin urea cream did work. She has also had a spot on her left eyelid, but none in over a year.  Chronic urticaria: Has random episodes of urticaria, unclear triggers.  Food allergy SPT 10/24/22-most common foods negative, obtained for patient reassurance.  Today presents for follow-up. She is having rash-told by video visit dermatology suspected acne, and she feels it is related to her getting her allergy injections. Additionally reports significant hives at site injections on days she gets injections despite taking antihistamines and doing epi washes.  She would like to discontinue injections at this time. She also  reports having recurrent hives but feels it is only in setting of eating sushi sometimes. She is able to eat shellfish outside of sushi and tolerates. Normally orders california roll or similar. Has had nausea when eating salmon in the past. Has also had hives after eating a veggie burger with red bell pepper. Her mother is allergic to red bell pepper.  Photos on phone of facial rash consistent with acne. Unclear how related to allergy injections but she does feel the timing is related so we will try stopping. She is also having large local reactions which have been painful and itchy after all injections. Last injection on 12/24/22 0.1 mL green, getting in hips.   Allergies as of 01/02/2023   No Known Allergies      Medication List        Accurate as of Jan 02, 2023  6:06 PM. If you have any questions, ask your nurse or doctor.          STOP taking these medications    Azelastine HCl 137 MCG/SPRAY Soln Stopped by: Verlee Monte, MD   clotrimazole 1 % cream Commonly known as: LOTRIMIN Stopped by: Verlee Monte, MD   doxycycline 50 MG capsule Commonly known as: VIBRAMYCIN Stopped by: Verlee Monte, MD   ketoconazole 2 % shampoo Commonly known as: NIZORAL Stopped by: Verlee Monte, MD       TAKE these medications    Adzenys XR-ODT 6.3 MG Tbed Generic drug: Amphetamine ER Take 1 tablet by mouth daily.   Azelaic Acid 15 % gel AFTER SKIN IS THOROUGHLY WASHED AND PATTED DRY, GENTLY AND THOROUGHLY APPLY A THIN FILM TO AFFECTED AREA IN THE MORNING  AND AT NIGHT   clindamycin 1 % gel Commonly known as: Clindagel Apply topically 2 (two) times daily.   Fish Oil 1000 MG Caps Take by mouth.   fluconazole 150 MG tablet Commonly known as: DIFLUCAN Take 1 tablet (150 mg total) by mouth every 3 (three) days as needed.   meloxicam 15 MG tablet Commonly known as: MOBIC Take 1 tablet (15 mg total) by mouth daily.   multivitamin tablet Take 1 tablet by mouth daily.   [name]  3-0.02 MG tablet Generic drug: drospirenone-ethinyl estradiol TAKE 1 TABLET BY MOUTH EVERY DAY   Restasis 0.05 % ophthalmic emulsion Generic drug: cycloSPORINE   spironolactone 25 MG tablet Commonly known as: ALDACTONE Take 1 tablet (25 mg total) by mouth daily.   tretinoin 0.025 % cream Commonly known as: RETIN-A Apply 1 Application topically at bedtime.       Past Medical History:  Diagnosis Date   Anxiety    Depression    Eczema    Nipple pain x's 2 months   Rosacea    Urticaria    Past Surgical History:  Procedure Laterality Date   AUGMENTATION MAMMAPLASTY     breast enhancement & removal   Otherwise, there have been no changes to her past medical history, surgical history, family history, or social history.  ROS: All others negative except as noted per HPI.   Objective:  BP (!) 120/98   Pulse 68   Temp 98.3 F (36.8 C)   Wt 109 lb 11.2 oz (49.8 kg)   SpO2 100%   BMI 19.43 kg/m  Body mass index is 19.43 kg/m. Physical Exam: General Appearance:  Alert, cooperative, no distress, appears stated age  Head:  Normocephalic, without obvious abnormality, atraumatic  Eyes:  Conjunctiva clear, EOM's intact  Nose: Nares normal, hypertrophic turbinates  Throat: Lips, tongue normal; teeth and gums normal, normal posterior oropharynx  Neck: Supple, symmetrical  Lungs:   clear to auscultation bilaterally, Respirations unlabored, no coughing  Heart:  regular rate and rhythm and no murmur, Appears well perfused  Extremities: No edema  Skin: Skin color, texture, turgor normal and no rashes or lesions on visualized portions of skin  Neurologic: No gross deficits   Assessment/Plan   Allergic rhinitis Continue allergen avoidance measures directed toward mold, dust mite, cat, and cockroach as listed below Begin carbinoxamine 4 mg twice a day as needed.   Stop your injections for now. Let's see how your skin does If you want to restart later, let me know and we may make  your maintenance dose a lower dose.  Acute Urticaria:  - unclear trigger - labs today: sesame, fish panel, green pepper/paprika - please avoid these foods until these results return - for SKIN only reaction, okay to take Benadryl 2 capsules every 4 hours - for SKIN + ANY additional symptoms, OR IF concern for LIFE THREATENING reaction = Epipen Autoinjector EpiPen 0.3 mg. - If using Epinephrine autoinjector, call 911 - A food allergy action plan has been provided and discussed. - Medic Alert identification is recommended.   Call the clinic if this treatment plan is not working well for you  Follow up in 4-6 months or sooner if needed. It was a pleasure seeing you again in clinic today! Thank you for allowing me to participate in your care.  Tonny Bollman, MD  [facility]

## 2023-01-02 ENCOUNTER — Telehealth: Payer: Managed Care, Other (non HMO) | Admitting: Internal Medicine

## 2023-01-02 ENCOUNTER — Other Ambulatory Visit: Payer: Self-pay

## 2023-01-02 ENCOUNTER — Ambulatory Visit (INDEPENDENT_AMBULATORY_CARE_PROVIDER_SITE_OTHER): Payer: Managed Care, Other (non HMO) | Admitting: Internal Medicine

## 2023-01-02 ENCOUNTER — Encounter: Payer: Self-pay | Admitting: Internal Medicine

## 2023-01-02 VITALS — BP 120/98 | HR 68 | Temp 98.3°F | Wt 109.7 lb

## 2023-01-02 DIAGNOSIS — H1013 Acute atopic conjunctivitis, bilateral: Secondary | ICD-10-CM

## 2023-01-02 DIAGNOSIS — L508 Other urticaria: Secondary | ICD-10-CM

## 2023-01-02 DIAGNOSIS — J302 Other seasonal allergic rhinitis: Secondary | ICD-10-CM | POA: Diagnosis not present

## 2023-01-02 DIAGNOSIS — L700 Acne vulgaris: Secondary | ICD-10-CM | POA: Diagnosis not present

## 2023-01-02 DIAGNOSIS — J3089 Other allergic rhinitis: Secondary | ICD-10-CM

## 2023-01-02 DIAGNOSIS — L2084 Intrinsic (allergic) eczema: Secondary | ICD-10-CM

## 2023-01-02 MED ORDER — CARBINOXAMINE MALEATE 4 MG PO TABS: 4.0000 mg | ORAL_TABLET | Freq: Two times a day (BID) | ORAL | 5 refills | Status: DC | PRN

## 2023-01-02 NOTE — Patient Instructions (Signed)
Allergic rhinitis Continue allergen avoidance measures directed toward mold, dust mite, cat, and cockroach as listed below Begin carbinoxamine 4 mg twice a day as needed.   Stop your injections for now. Let's see how your skin does If you want to restart later, let me know and we may make your maintenance dose a lower dose.  Acute Urticaria:  - unclear trigger - labs today: sesame, fish panel, green pepper/paprika - please avoid these foods until these results return - for SKIN only reaction, okay to take Benadryl 2 capsules every 4 hours - for SKIN + ANY additional symptoms, OR IF concern for LIFE THREATENING reaction = Epipen Autoinjector EpiPen 0.3 mg. - If using Epinephrine autoinjector, call 911 - A food allergy action plan has been provided and discussed. - Medic Alert identification is recommended.   Call the clinic if this treatment plan is not working well for you  Follow up in 4-6 months or sooner if needed. It was a pleasure seeing you again in clinic today! Thank you for allowing me to participate in your care.  Tonny Bollman, MD [facility]

## 2023-01-05 LAB — ALLERGEN PROFILE, FOOD-FISH
Allergen Mackerel IgE: <0.1 kU/L
Allergen Salmon IgE: <0.1 kU/L
Allergen Trout IgE: <0.1 kU/L
Allergen Walley Pike IgE: <0.1 kU/L
Codfish IgE: <0.1 kU/L
Halibut IgE: <0.1 kU/L
Tuna: <0.1 kU/L

## 2023-01-05 LAB — ALLERGEN SESAME F10: Sesame Seed IgE: <0.1 kU/L

## 2023-01-05 LAB — ALLERGEN,GRN PEPPER,PAPRIKA,F218: Paprika IgE: <0.1 kU/L

## 2023-01-15 ENCOUNTER — Encounter: Payer: Managed Care, Other (non HMO) | Admitting: Medical

## 2023-01-22 ENCOUNTER — Encounter: Payer: Self-pay | Admitting: Internal Medicine

## 2023-01-22 ENCOUNTER — Ambulatory Visit (INDEPENDENT_AMBULATORY_CARE_PROVIDER_SITE_OTHER): Payer: Managed Care, Other (non HMO) | Admitting: Internal Medicine

## 2023-01-22 VITALS — BP 110/80 | HR 84 | Temp 98.4°F | Wt 107.4 lb

## 2023-01-22 DIAGNOSIS — L709 Acne, unspecified: Secondary | ICD-10-CM | POA: Diagnosis not present

## 2023-01-22 DIAGNOSIS — K9041 Non-celiac gluten sensitivity: Secondary | ICD-10-CM | POA: Diagnosis not present

## 2023-01-22 DIAGNOSIS — M2550 Pain in unspecified joint: Secondary | ICD-10-CM

## 2023-01-22 NOTE — Progress Notes (Signed)
Established Patient Office Visit     CC/Reason for Visit: Discuss concerns  HPI: Marie Hanson is a 42 y.o. female who is coming in today for the above mentioned reasons.  She has been getting allergy immunizations.  She discontinued them because she started developing acne that she thought was related to them despite the allergist not thinking so.  She has an appointment to see dermatology in July.  She believes that the allergy immunizations have also caused gluten intolerance and joint inflammation.  She wants to be checked for celiac disease and referral to a rheumatologist.   Past Medical/Surgical History: Past Medical History:  Diagnosis Date   Anxiety    Depression    Eczema    Nipple pain x's 2 months   Rosacea    Urticaria     Past Surgical History:  Procedure Laterality Date   AUGMENTATION MAMMAPLASTY     breast enhancement & removal    Social History:  reports that she has never smoked. She has never been exposed to tobacco smoke. She has never used smokeless tobacco. She reports that she does not currently use alcohol. She reports that she does not use drugs.  Allergies: No Known Allergies  Family History:  Family History  Problem Relation Age of Onset   Urticaria Mother    Eczema Mother    Allergic rhinitis Mother    Diabetes Mother    Depression Father    Anxiety disorder Father    BRCA 1/2 Neg Hx    Breast cancer Neg Hx      Current Outpatient Medications:    ADZENYS XR-ODT 6.3 MG TBED, Take 1 tablet by mouth daily., Disp: , Rfl:    Azelaic Acid 15 % gel, AFTER SKIN IS THOROUGHLY WASHED AND PATTED DRY, GENTLY AND THOROUGHLY APPLY A THIN FILM TO AFFECTED AREA IN THE MORNING AND AT NIGHT, Disp: 50 g, Rfl: 2   fexofenadine (ALLEGRA) 180 MG tablet, Take 180 mg by mouth daily., Disp: , Rfl:    loratadine (CLARITIN) 10 MG tablet, Take 10 mg by mouth daily., Disp: , Rfl:    Multiple Vitamin (MULTIVITAMIN) tablet, Take 1 tablet by mouth  daily., Disp: , Rfl:    [name] 3-0.02 MG tablet, TAKE 1 TABLET BY MOUTH EVERY DAY, Disp: 84 tablet, Rfl: 1   RESTASIS 0.05 % ophthalmic emulsion, , Disp: , Rfl:    tretinoin (RETIN-A) 0.025 % cream, Apply 1 Application topically at bedtime., Disp: , Rfl:    Omega-3 Fatty Acids (FISH OIL) 1000 MG CAPS, Take by mouth. (Patient not taking: Reported on 01/22/2023), Disp: , Rfl:    spironolactone (ALDACTONE) 25 MG tablet, Take 1 tablet (25 mg total) by mouth daily. (Patient not taking: Reported on 01/02/2023), Disp: 90 tablet, Rfl: 1  Review of Systems:  Negative unless indicated in HPI.   Physical Exam: Vitals:   01/22/23 1316  BP: 110/80  Pulse: 84  Temp: 98.4 F (36.9 C)  TempSrc: Oral  SpO2: 99%  Weight: 107 lb 6.4 oz (48.7 kg)    Body mass index is 19.03 kg/m.   Physical Exam Vitals reviewed.  Constitutional:      Appearance: Normal appearance.  HENT:     Head: Normocephalic and atraumatic.  Eyes:     Conjunctiva/sclera: Conjunctivae normal.     Pupils: Pupils are equal, round, and reactive to light.  Skin:    General: Skin is warm and dry.  Neurological:     General: No  focal deficit present.     Mental Status: She is alert and oriented to person, place, and time.  Psychiatric:        Mood and Affect: Mood normal.        Behavior: Behavior normal.        Thought Content: Thought content normal.        Judgment: Judgment normal.      Impression and Plan:  Acne, unspecified acne type  Gluten intolerance -     Tissue transglutaminase, IgA  Arthralgia, unspecified joint -     Ambulatory referral to Rheumatology  -I also do not believe that allergy immunizations have led to celiac disease, acne or joint inflammation.  We discussed pros and cons of testing and referral and she is decisive that she want them done.  Will check a tissue transglutaminase and refer to rheumatology.   Time spent:31 minutes reviewing chart, interviewing and examining patient and  formulating plan of care.     [name], MD [facility]

## 2023-01-23 ENCOUNTER — Telehealth: Payer: Managed Care, Other (non HMO) | Admitting: Nurse Practitioner

## 2023-01-23 DIAGNOSIS — L709 Acne, unspecified: Secondary | ICD-10-CM

## 2023-01-23 LAB — TISSUE TRANSGLUTAMINASE, IGA: (tTG) Ab, IgA: <1 U/mL

## 2023-01-23 MED ORDER — CLINDAMYCIN PHOSPHATE 1 % EX GEL
Freq: Two times a day (BID) | CUTANEOUS | 0 refills | Status: DC
Start: 2023-01-23 — End: 2023-06-26

## 2023-01-23 MED ORDER — CLINDAMYCIN PHOSPHATE 1 % EX LOTN
1.0000 | TOPICAL_LOTION | Freq: Two times a day (BID) | CUTANEOUS | 1 refills | Status: DC
Start: 2023-01-23 — End: 2023-01-23

## 2023-01-23 NOTE — Progress Notes (Signed)
Duplicate Rx resent no charge

## 2023-01-23 NOTE — Progress Notes (Signed)
E-Visit for Acne  We are sorry that you are experiencing this issue.  Here is how we plan to help!  Based on what you shared with me it looks like you have cystic acne.  Acne is a disorder of the hair follicles and oil glands (sebaceous glands). The sebaceous glands secrete oils to keep the skin moist.  When the glands get clogged, it can lead to pimples or cysts.  These cysts may become infected and leave scars. Acne is very common and normally occurs at puberty.  Acne is also inherited.  Your personal care plan consists of the following recommendations:    I have prescribed a topical gel with an antibiotic:  Meds ordered this encounter  Medications   clindamycin (CLEOCIN T) 1 % lotion    Sig: Apply 1 Application topically 2 (two) times daily.    Dispense:  60 mL    Refill:  1      If excessive dryness or peeling occurs, reduce dose frequency or concentration of the topical scrubs.  If excessive stinging or burning occurs, remove the topical gel with mild soap and water and resume at a lower dose the next day.  Remember oral antibiotics and topical acne treatments may increase your sensitivity to the sun!  HOME CARE: Do not squeeze pimples because that can often lead to infections, worse acne, and scars. Use a moisturizer that contains retinoid or fruit acids that may inhibit the development of new acne lesions. Although there is not a clear link that foods can cause acne, doctors do believe that too many sweets predispose you to skin problems.  GET HELP RIGHT AWAY IF: If your acne gets worse or is not better within 10 days. If you become depressed. If you become pregnant, discontinue medications and call your OB/GYN.  MAKE SURE YOU: Understand these instructions. Will watch your condition. Will get help right away if you are not doing well or get worse.  Thank you for choosing an e-visit.  Your e-visit answers were reviewed by a board certified advanced clinical practitioner  to complete your personal care plan. Depending upon the condition, your plan could have included both over the counter or prescription medications.  Please review your pharmacy choice. Make sure the pharmacy is open so you can pick up prescription now. If there is a problem, you may contact your provider through [employer] and have the prescription routed to another pharmacy.  Your safety is important to [place]. If you have drug allergies check your prescription carefully.   For the next 24 hours you can use MyChart to ask questions about today's visit, request a non-urgent call back, or ask for a work or school excuse. You will get an email in the next two days asking about your experience. I hope that your e-visit has been valuable and will speed your recovery.   I spent approximately 5 minutes reviewing the patient's history, current symptoms and coordinating their care today.

## 2023-01-29 NOTE — Progress Notes (Unsigned)
FOLLOW UP Date of Service/Encounter:  01/30/23   Subjective:  Marie Hanson (DOB: 13-Dec-1980) is a 42 y.o. female who [facility] in re-evaluation of the following: acute visit for new food allergy concerns History obtained from: chart review and patient.  For Review, LV was on 01/02/23  with Dr.Clay Menser seen for acute visit for rash . See below for summary of history and diagnostics.  Therapeutic plans/changes recommended: Rash consistent with acne, but patient felt it coincided with her allergy injections so these were stopped. ----------------------------------------------------- Pertinent History/Diagnostics:  Allergic Rhinitis:  Congestion and pain, worse on right. + itchy eyes with occasional drainage. Recent ENT visit with normal rhinoscopy. Moved to [place] 3.5 years prior. Symptoms worse in spring and fall.  Horse has been a trigger.  Has history of nasal cauterization. [place] AIT on 11/13/22. Last injection 12/24/22-stopped due to acne which occurred near time of injections as well as localized reactions despite epi wash and extra antihistamine. - SPT environmental panel (10/24/22): positive to grass pollen, weed pollen, tree pollen, indoor/outdoor molds, dog, horse, tobacco leaf -intradermal testing positive to cat and ragweed Eczema: occasionally, flares last year around her nipple. Normal mammogram. She did see a dermatologist and was given a non-steroid cream that didn't work. Eucerin urea cream did work. She has also had a spot on her left eyelid, but none in over a year.  Chronic urticaria: Has random episodes of urticaria, unclear triggers.  Food allergy SPT 10/24/22-most common foods negative, obtained for patient reassurance. Concern for food allergy:  Hives when eating sushi - normally orders [place] roll or similar Hives from veggie burger with red bell pepper Salmon-nausea - serum IgE sesame negative, paprika/green  pepper negative, cod, halibut, pike, tuna, salmon, mackerel, trout negative - negative tTG 01/21/22 --------------------------------------------------- Today presents for new concerns for food allergies. She has cut out gluten from her diet. She had serum testing for celiac which was negative. She has concerns that foods are causing her acne to flare and is now on a very restrictive diet. She brings a list of multiple foods that she had been eating regularly and would like allergy testing.   She broke out into hives and acne the following day after eating gluten free bread and also  had hives while eating sushi again. Now concerned about soy from soy sauce as well ingredients in gluten free bread-sunflower, buckwheat, flaxseed where some of the ingredients listed. She does have an epipen. Has not  had to use it.  She wonders if allergy injections are the root cause for her "food allergies".   Allergies as of 01/30/2023       Reactions   Iodine         Medication List        Accurate as of January 30, 2023 12:56 PM. If you have any questions, ask your nurse or doctor.          Adzenys XR-ODT 6.3 MG Tbed Generic drug: Amphetamine ER Take 1 tablet by mouth daily.   Azelaic Acid 15 % gel AFTER SKIN IS THOROUGHLY WASHED AND PATTED DRY, GENTLY AND THOROUGHLY APPLY A THIN FILM TO AFFECTED AREA IN THE MORNING AND AT NIGHT   clindamycin 1 % gel Commonly known as: Clindagel Apply topically 2 (two) times daily.   doxycycline 50 MG capsule Commonly known as: VIBRAMYCIN Take 50 mg by mouth daily.   fexofenadine 180 MG tablet Commonly known as: ALLEGRA Take 180 mg  by mouth daily.   Fish Oil 1000 MG Caps Take by mouth.   loratadine 10 MG tablet Commonly known as: CLARITIN Take 10 mg by mouth daily.   multivitamin tablet Take 1 tablet by mouth daily.   [name] 3-0.02 MG tablet Generic drug: drospirenone-ethinyl estradiol TAKE 1 TABLET BY MOUTH EVERY DAY   Restasis 0.05 %  ophthalmic emulsion Generic drug: cycloSPORINE   spironolactone 25 MG tablet Commonly known as: ALDACTONE Take 1 tablet (25 mg total) by mouth daily.   tretinoin 0.025 % cream Commonly known as: RETIN-A Apply 1 Application topically at bedtime.       Past Medical History:  Diagnosis Date   Anxiety    Depression    Eczema    Nipple pain x's 2 months   Rosacea    Urticaria    Past Surgical History:  Procedure Laterality Date   AUGMENTATION MAMMAPLASTY     breast enhancement & removal   Otherwise, there have been no changes to her past medical history, surgical history, family history, or social history.  ROS: All others negative except as noted per HPI.   Objective:  BP 102/74   Pulse 68   Temp 98.6 F (37 C) (Oral)   Ht 5' 3.78" (1.62 m)   Wt 108 lb (49 kg)   LMP 01/08/2023 (Approximate)   SpO2 100%   BMI 18.67 kg/m  Body mass index is 18.67 kg/m. Physical Exam: General Appearance:  Alert, cooperative, no distress, appears stated age  Head:  Normocephalic, without obvious abnormality, atraumatic  Eyes:  Conjunctiva clear, EOM's intact  Nose: Nares normal, normal mucosa  Throat: Lips, tongue normal; teeth and gums normal  Neck: Supple, symmetrical  Lungs:   clear to auscultation bilaterally, Respirations unlabored, no coughing  Heart:  regular rate and rhythm and no murmur, Appears well perfused  Extremities: No edema  Skin: Skin color, texture, turgor normal and no rashes or lesions on visualized portions of skin  Neurologic: No gross deficits   Labs: see below  Assessment/Plan   Spent time explaining the difference between food allergy vs food intolerance. Discussed that acne itself would not a symptoms of food allergy which is IgE mediated and produces specific and reproducible symptoms. There is no clear evidence that acne is caused by a food intolerance. There is no evidence based testing for food intolerance. However, I am concerned regarding her  restrictive dieting so agreed to food allergy testing in hopes that any negatives will provider her reassurance. Some of her symptoms are concerning for possible food allergy (immediate onset of hives) but thus far, her testing has been negative. We did test for some new foods today found in her gluten free bread as well as soy.   She also has a history of chronic hives, so we explored the possibility that her hives are not related to allergies at all and are idiopathic or intrinsic. We also discussed that acne does not always have a clear cut cause, but hormonal changes is another factor to consider. She reports being on birth control for some time now. I am unaware of any associations with acne and allergy injections, but would expect acne to resolve quickly once stopping injections if these were related .  Her last injection was over one month ago.  Allergic rhinitis Continue allergen avoidance measures directed toward mold, dust mite, cat, and cockroach as listed below Begin carbinoxamine 4 mg twice a day as needed.   Stop your injections for now. Let's  see how your skin does If you want to restart later, let me know and we may make your maintenance dose a lower dose.  Acne:  This is not a sign of food allergy. This is not a known side effect from allergy injections, but should resolve quickly once stopping if related Do not suspect a food intolerance-okay to journal, but please be wary of overly restricting diets as this has other unwanted health outcomes Allergy testing to foods does not pick up food intolerances, so the testing being performed today is being provided for reassurance  Acute Urticaria:  - unclear trigger - labs today: foods of concern-soy, sunflower, flaxseed, buckwheat - please avoid these foods until these results return We also ordered: milk, egg, nut panel, banana, apple, wheat, chocolate, oat, rice, barley as per patient's request - for SKIN only reaction, okay to  take Benadryl 2 capsules every 4 hours - for SKIN + ANY additional symptoms, OR IF concern for LIFE THREATENING reaction = Epipen Autoinjector EpiPen 0.3 mg. - If using Epinephrine autoinjector, call 911 - A food allergy action plan has been provided and discussed. - Medic Alert identification is recommended.   Call the clinic if this treatment plan is not working well for you  Follow up in 4-6 months or sooner if needed. It was a pleasure seeing you again in clinic today! Thank you for allowing me to participate in your care.  Tonny Bollman, MD  [facility]

## 2023-01-30 ENCOUNTER — Encounter: Payer: Self-pay | Admitting: Internal Medicine

## 2023-01-30 ENCOUNTER — Other Ambulatory Visit: Payer: Self-pay

## 2023-01-30 ENCOUNTER — Ambulatory Visit (INDEPENDENT_AMBULATORY_CARE_PROVIDER_SITE_OTHER): Payer: Managed Care, Other (non HMO) | Admitting: Internal Medicine

## 2023-01-30 VITALS — BP 102/74 | HR 68 | Temp 98.6°F | Ht 63.78 in | Wt 108.0 lb

## 2023-01-30 DIAGNOSIS — J302 Other seasonal allergic rhinitis: Secondary | ICD-10-CM

## 2023-01-30 DIAGNOSIS — L700 Acne vulgaris: Secondary | ICD-10-CM

## 2023-01-30 DIAGNOSIS — T781XXD Other adverse food reactions, not elsewhere classified, subsequent encounter: Secondary | ICD-10-CM | POA: Diagnosis not present

## 2023-01-30 DIAGNOSIS — L508 Other urticaria: Secondary | ICD-10-CM | POA: Diagnosis not present

## 2023-01-30 DIAGNOSIS — J3089 Other allergic rhinitis: Secondary | ICD-10-CM

## 2023-01-30 NOTE — Patient Instructions (Addendum)
Allergic rhinitis Continue allergen avoidance measures directed toward mold, dust mite, cat, and cockroach as listed below Begin carbinoxamine 4 mg twice a day as needed.   Stop your injections for now. Let's see how your skin does If you want to restart later, let me know and we may make your maintenance dose a lower dose.  Acne:  This is not a sign of food allergy. This is not a known side effect from allergy injections, but should resolve quickly once stopping if related Do not suspect a food intolerance-okay to journal, but please be wary of overly restricting diets as this has other unwanted health outcomes Allergy testing to foods does not pick up food intolerances, so the testing being performed today is being provided for reassurance  Acute Urticaria:  - unclear trigger - labs today: foods of concern-soy, sunflower, flaxseed, buckwheat - please avoid these foods until these results return - for SKIN only reaction, okay to take Benadryl 2 capsules every 4 hours - for SKIN + ANY additional symptoms, OR IF concern for LIFE THREATENING reaction = Epipen Autoinjector EpiPen 0.3 mg. - If using Epinephrine autoinjector, call 911 - A food allergy action plan has been provided and discussed. - Medic Alert identification is recommended.   Call the clinic if this treatment plan is not working well for you  Follow up in 4-6 months or sooner if needed. It was a pleasure seeing you again in clinic today! Thank you for allowing me to participate in your care.  Tonny Bollman, MD [facility]

## 2023-02-01 LAB — ALLERGEN, APPLE F49: Allergen Apple, IgE: 0.11 kU/L — AB

## 2023-02-03 LAB — PEANUT COMPONENTS
F352-IgE Ara h 8: 0.17 kU/L — AB
F422-IgE Ara h 1: <0.1 kU/L
F423-IgE Ara h 2: <0.1 kU/L
F424-IgE Ara h 3: <0.1 kU/L
F427-IgE Ara h 9: <0.1 kU/L
F447-IgE Ara h 6: <0.1 kU/L

## 2023-02-03 LAB — ALLERGEN MILK: Milk IgE: 0.23 kU/L — AB

## 2023-02-03 LAB — IGE NUT PROF. W/COMPONENT RFLX
F017-IgE Hazelnut (Filbert): 1.25 kU/L — AB
F018-IgE Brazil Nut: <0.1 kU/L
F020-IgE Almond: 0.18 kU/L — AB
F202-IgE Cashew Nut: <0.1 kU/L
F203-IgE Pistachio Nut: <0.1 kU/L
F256-IgE Walnut: <0.1 kU/L
Macadamia Nut, IgE: <0.1 kU/L
Peanut, IgE: 0.22 kU/L — AB
Pecan Nut IgE: <0.1 kU/L

## 2023-02-03 LAB — ALLERGEN CHOCOLATE: Chocolate/Cacao IgE: <0.1 kU/L

## 2023-02-03 LAB — ALLERGEN SOYBEAN: Soybean IgE: <0.1 kU/L

## 2023-02-03 LAB — ALLERGEN EGG WHITE F1: Egg White IgE: <0.1 kU/L

## 2023-02-03 LAB — PANEL 604726
Cor A 1 IgE: 0.49 kU/L — AB
Cor A 14 IgE: <0.1 kU/L
Cor A 8 IgE: 2.02 kU/L — AB
Cor A 9 IgE: <0.1 kU/L

## 2023-02-03 LAB — ALLERGEN, RICE, F9: Allergen Rice IgE: <0.1 kU/L

## 2023-02-03 LAB — ALLERGEN, WHEAT, F4: Wheat IgE: 0.21 kU/L — AB

## 2023-02-03 LAB — ALLERGEN SUNFLOWER SEED K84: Sunflower Seed k84: <0.1 kU/L

## 2023-02-03 LAB — ALLERGEN FLAXSEED F333 IGE: Linseed/Flaxseed: <0.1 kU/L

## 2023-02-03 LAB — ALLERGEN COMPONENT COMMENTS

## 2023-02-03 LAB — ALLERGEN,OAT,F7: Allergen Oat IgE: <0.1 kU/L

## 2023-02-03 LAB — ALLERGEN BARLEY F6: Allergen Barley IgE: <0.1 kU/L

## 2023-02-03 LAB — F011-IGE BUCKWHEAT: F011-IgE Buckwheat: <0.1 kU/L

## 2023-02-03 LAB — ALLERGEN BANANA: Allergen Banana IgE: 0.16 kU/L — AB

## 2023-02-04 NOTE — Progress Notes (Signed)
Please let Marie Hanson know that her food allergy testing has returned negative to everything except hazelnut. I would have her avoid hazelnut and be careful with other tree nuts  -make sure they are not manufactured together, etc.  Otherwise she does not need to avoid any of these other foods as her values are less than 0.35, the cut-off for clinically significant allergies.  Let me know if she has any questions.

## 2023-02-11 NOTE — Patient Instructions (Incomplete)
Allergic rhinitis Continue allergen avoidance measures directed toward mold, dust mite, cat, and cockroach as listed below Continue antihistamines as below  Acne: resolved  Acute Urticaria: none since last appointment - unclear trigger - avoid hazelnut and be careful with other tree nuts (make sure they are not manufactured together) - for SKIN only reaction, okay to take Benadryl 2 capsules every 4 hours - for SKIN + ANY additional symptoms, OR IF concern for LIFE THREATENING reaction = Epipen Autoinjector EpiPen 0.3 mg. - If using Epinephrine autoinjector, call 911 - A food allergy action plan has been provided and discussed. - Medic Alert identification is recommended.  Rash/pruritus We will get lab work for some additional foods. We will call you with results once they are all back. We will also check a tryptase due to your concern for MCAS Start Allegra 180 mg twice a day Continue Pepcid 10 mg twice a day Continue Singulair 10 mg at night Stop Claritin and Benadryl Discussed possible patch testing to check for contact dermatitis. She and her husband do not feel that this is the cause and feel like it is caused by foods.   Call the clinic if this treatment plan is not working well for you  Follow up in 4-6 weeks with Dr. Maurine Minister or sooner if needed.

## 2023-02-12 ENCOUNTER — Ambulatory Visit (INDEPENDENT_AMBULATORY_CARE_PROVIDER_SITE_OTHER): Payer: Managed Care, Other (non HMO) | Admitting: Family

## 2023-02-12 ENCOUNTER — Encounter: Payer: Self-pay | Admitting: Family

## 2023-02-12 ENCOUNTER — Other Ambulatory Visit: Payer: Self-pay

## 2023-02-12 VITALS — BP 120/80 | HR 70 | Temp 97.7°F | Resp 16 | Wt 108.8 lb

## 2023-02-12 DIAGNOSIS — L508 Other urticaria: Secondary | ICD-10-CM

## 2023-02-12 DIAGNOSIS — R21 Rash and other nonspecific skin eruption: Secondary | ICD-10-CM | POA: Diagnosis not present

## 2023-02-12 DIAGNOSIS — J3089 Other allergic rhinitis: Secondary | ICD-10-CM

## 2023-02-12 DIAGNOSIS — T781XXA Other adverse food reactions, not elsewhere classified, initial encounter: Secondary | ICD-10-CM | POA: Diagnosis not present

## 2023-02-12 DIAGNOSIS — T781XXD Other adverse food reactions, not elsewhere classified, subsequent encounter: Secondary | ICD-10-CM

## 2023-02-12 DIAGNOSIS — J302 Other seasonal allergic rhinitis: Secondary | ICD-10-CM

## 2023-02-12 MED ORDER — MONTELUKAST SODIUM 10 MG PO TABS: 10.0000 mg | ORAL_TABLET | Freq: Every day | ORAL | 5 refills | Status: DC

## 2023-02-12 NOTE — Progress Notes (Signed)
[address] [place] [address] Dept: [phone]  FOLLOW UP NOTE  Patient ID: Marie Hanson, female    DOB: 1981-06-23  Age: 42 y.o. MRN: [id number] Date of Office Visit: 02/12/2023  Assessment  Chief Complaint: No chief complaint on file.  HPI Marie Hanson is a 42 year old female who presents today  for an acute visit of itching after every meal.  She was last seen on January 30, 2023 by Dr. Maurine Minister for allergic rhinitis, acne, and acute urticaria.  Her husband is here with her today and helps provide history.  They deny any new diagnosis or surgery since her last office visit.  She reports that her skin is very clear, but would break out more with allergy injections.  She stopped her allergy injections on a lot of her acne went away.  She then started getting food allergy symptoms.  At the time she was eating a lot of almonds, peanuts, and bananas.  She has avoided all of these foods and the acne has gone away.  She reports that she is itchy all the time after she eats.  The itching starts 10 to 20 minutes past eating each meal.  The itching occurs only from her neck up.  When she is itchy she does not see a rash or have hives.  She will have itchy ears, nose, and her right ear will feel hot.  There was 1 time her tongue felt slightly puffy.  She denies any concomitant cardiorespiratory and gastrointestinal symptoms.  She does have an EpiPen and it is up-to-date and reports that she knows how to use it.  She has been eating a lot of [employer] cereal which she reports contains a lot of pea protein.  The day before she ate a lot of the [facility] crunch cereal and woke up yesterday morning with red spots on her forehead and around her eyes.  These areas are not itchy.  She describes these areas is pinpoint.  She reports that these areas are not the hives that she has had in the past. They are not itchy.  She reports that her previous acne was itchy.  She would like to get lab  work to some additional foods.  She and her husband are certain that foods are the cause of her symptoms.  She also mentions that she has done research online and wonders if she has MCAS.  She feels like so many of her symptoms are like those.  She has been taking Singulair at night, Pepcid 10 mg twice a day, Allegra in the morning and either Claritin or Benadryl at night and reports that this has made a big difference in her skin has been clear. She is requesting a refill of Singulair.  Both she and her husband do not feel like contact dermatitis could be the cause of the non-itchy rash on her upper eyelids and forehead region.  They report that the products that she uses have not changed.  "Loran [employer]ers is a brand of keto-friendly cereals and cookies. The ingredients of [employer] products vary depending on the flavor and type, but some common ingredients are: Catalina flour (pea protein, potato fiber, non-GMO corn fiber, chicory root fiber, guar gum) Tapioca flour Natural flavors Stevia extract Monk fruit Cocoa powder Palm fruit oil"  Drug Allergies:  Allergies  Allergen Reactions   Iodine     Review of Systems: Review of Systems  Constitutional:  Negative for chills and fever.  HENT:  Denies rhinorrhea, nasal congestion, and postnasal drip.  Respiratory:  Negative for cough, shortness of breath and wheezing.   Gastrointestinal:  Negative for abdominal pain, diarrhea, nausea and vomiting.  Skin:  Positive for itching and rash.  Endo/Heme/Allergies:  Positive for environmental allergies.     Physical Exam: BP 120/80   Pulse 70   Temp 97.7 F (36.5 C) (Temporal)   Resp 16   Wt 108 lb 12.8 oz (49.4 kg)   LMP 01/08/2023 (Approximate)   SpO2 100%   BMI 18.80 kg/m    Physical Exam Exam conducted with a chaperone present.  Constitutional:      Appearance: Normal appearance.  HENT:     Head: Normocephalic and atraumatic.     Comments: Pharynx normal, eyes  normal, ears normal, nose normal    Right Ear: Tympanic membrane, ear canal and external ear normal.     Left Ear: Tympanic membrane, ear canal and external ear normal.     Nose: Nose normal.     Mouth/Throat:     Mouth: Mucous membranes are moist.     Pharynx: Oropharynx is clear.  Eyes:     Conjunctiva/sclera: Conjunctivae normal.  Cardiovascular:     Rate and Rhythm: Regular rhythm.     Heart sounds: Normal heart sounds.  Pulmonary:     Effort: Pulmonary effort is normal.     Breath sounds: Normal breath sounds.     Comments: Lungs clear to auscultation Musculoskeletal:     Cervical back: Neck supple.  Skin:    General: Skin is warm.     Comments: Small pinpoint slightly erythematous areas noted on bilateral upper eyelids.  Neurological:     Mental Status: She is alert and oriented to person, place, and time.  Psychiatric:        Mood and Affect: Mood normal.        Behavior: Behavior normal.        Thought Content: Thought content normal.        Judgment: Judgment normal.     Diagnostics/Pertinent History:   Food allergy skin prick test on October 24, 2022 to most common foods was negative.  Lab work to select foods (buckwheat, apple, flaxseed, milk, chocolate, barley, rice, banana peanuts, tree nuts, oat, egg whites, wheat, sunflower, and soybean were negative except hazelnut.  At that time she was instructed to avoid hazelnut and be careful with other tree nuts.    Assessment and Plan: 1. Adverse food reaction, subsequent encounter   2. Rash and nonspecific skin eruption   3. Seasonal and perennial allergic rhinitis   4. Acute urticaria     Meds ordered this encounter  Medications   montelukast (SINGULAIR) 10 MG tablet    Sig: Take 1 tablet (10 mg total) by mouth at bedtime.    Dispense:  30 tablet    Refill:  5    Patient Instructions  Allergic rhinitis Continue allergen avoidance measures directed toward mold, dust mite, cat, and cockroach as listed  below Continue antihistamines as below  Acne: resolved  Acute Urticaria: none since last appointment - unclear trigger - avoid hazelnut and be careful with other tree nuts (make sure they are not manufactured together) - for SKIN only reaction, okay to take Benadryl 2 capsules every 4 hours - for SKIN + ANY additional symptoms, OR IF concern for LIFE THREATENING reaction = Epipen Autoinjector EpiPen 0.3 mg. - If using Epinephrine autoinjector, call 911 - A food allergy action plan has been  provided and discussed. - Medic Alert identification is recommended.  Rash/pruritus We will get lab work for some additional foods. We will call you with results once they are all back. We will also check a tryptase due to your concern for MCAS Start Allegra 180 mg twice a day Continue Pepcid 10 mg twice a day Continue Singulair 10 mg at night Stop Claritin and Benadryl Discussed possible patch testing to check for contact dermatitis. She and her husband do not feel that this is the cause and feel like it is caused by foods.   Call the clinic if this treatment plan is not working well for you  Follow up in 4-6 weeks with Dr. Maurine Minister or sooner if needed.   Return in about 4 weeks (around 03/12/2023), or if symptoms worsen or fail to improve.    Thank you for the opportunity to care for this patient.  Please do not hesitate to contact me with questions.  [name], FNP [facility]

## 2023-02-14 LAB — ALLERGEN PEA F12

## 2023-02-14 LAB — ALLERGEN COCONUT IGE

## 2023-02-14 LAB — ALLERGEN, CORN F8

## 2023-02-14 LAB — ALLERGEN, TOMATO F25

## 2023-02-14 LAB — ALLERGEN, BAKERS YEAST, F45

## 2023-02-14 LAB — ALLERGEN, BROCCOLI, F260

## 2023-02-16 LAB — ALLERGEN, SWEET POTATO,F54: Allergen Sweet Potato IgE: <0.1 kU/L

## 2023-02-16 LAB — TRYPTASE: Tryptase: 3 ug/L (ref 2.2–13.2)

## 2023-02-16 LAB — ALLERGEN GRAPE F259: Allergen Grape IgE: 0.37 kU/L — AB

## 2023-02-16 LAB — ALLERGEN, BEAN BLACK

## 2023-02-16 LAB — ALLERGEN, WHITE POTATO,F35: Allergen Potato, White IgE: 0.3 kU/L — AB

## 2023-02-19 LAB — ALLERGEN AVOCADO F96: F096-IgE Avocado: <0.1 kU/L

## 2023-02-19 LAB — ALLERGEN, BEAN BLACK: Class Interpretation: 0

## 2023-02-21 ENCOUNTER — Ambulatory Visit: Payer: Managed Care, Other (non HMO) | Admitting: Internal Medicine

## 2023-02-21 NOTE — Progress Notes (Signed)
Please let Marie Hanson know that her food allergy testing has returned negative to everything except grape. I would have her try avoiding grape and see if this helps. If she does not notice a difference in her skin I would recommend adding grape back to her diet.Otherwise she does not need to avoid any of these other foods as her values are less than 0.35, the cut-off for clinically significant allergies.   Also, her tryptase is normal, so this rules out mast cell disease.

## 2023-02-23 ENCOUNTER — Other Ambulatory Visit: Payer: Self-pay | Admitting: Nurse Practitioner

## 2023-02-23 DIAGNOSIS — L709 Acne, unspecified: Secondary | ICD-10-CM

## 2023-02-24 NOTE — Patient Instructions (Addendum)
Allergic rhinitis Continue allergen avoidance measures directed toward mold, dust mite, cat, and cockroach as listed below Continue antihistamines as below  Acne: resolved  Acute Urticaria:  - unclear trigger - avoid hazelnut and be careful with other tree nuts (make sure they are not manufactured together). Also avoid grapes . -Skin testing on October 24, 2022 was negative to peanut, soybean, wheat, sesame, milk, egg, casein, shellfish mix, fish mix, and cashew with adequate controls -Lab work from May 2024 negative to codfish, halibut, [employer], tuna, salmon, mackerel, Trout, paprika/green pepper, and sesame. -Lab work from January 30, 2023 negative to or less than 0.35, the cut-off for clinically significant allergies: Buckwheat, apple, flaxseed, milk, chocolate, barley, rice, banana, oat, almond, pistachio, pecan, macadamia nut, peanut, [place] nut, cashew, walnut, egg white, wheat, sunflower, and soybean.  Peanut components Ara H8 with 0.17.  Ara H1, 2, 3, 6, and 9 were negative.  Hazelnut was 1.25 with cor a 1 and core a 8 being positive.  Cor A9 and corA 14 were negative. -Lab work from February 12, 2023 were negative for less than 0.35, the cut off for clinically significant allergies, to black bean, broccoli, coconut, pea, bakers yeast, corn, avocado, sweet potato, white potato, and tomato.  Grape was 0.37.  Tryptase is normal ruling out mast cell disease   - for SKIN only reaction, okay to take Benadryl 2 capsules every 4 hours - for SKIN + ANY additional symptoms, OR IF concern for LIFE THREATENING reaction = Epipen Autoinjector EpiPen 0.3 mg. - If using Epinephrine autoinjector, call 911 - A food allergy action plan has been provided and discussed. - Medic Alert identification is recommended. -Discussed how I do not think that the foods she is eating is causing these delayed reactions. Agree with second opinion with [facility]  Rash/pruritus Continue Allegra 180 mg twice a  day Continue Pepcid 10 mg twice a day Continue Singulair 10 mg at night Discussed possible patch testing to check for contact dermatitis. She and her husband do not feel that this is the cause and feel like it is caused by foods. Will get lab work to look for causes of pruritus (cbc with diff, CMP and thyroid cascade. We will call you with results once they are all back   Call the clinic if this treatment plan is not working well for you   Keep follow up on 03/18/23  with Dr. Maurine Minister or sooner if needed.

## 2023-02-26 ENCOUNTER — Other Ambulatory Visit: Payer: Self-pay

## 2023-02-26 ENCOUNTER — Ambulatory Visit (INDEPENDENT_AMBULATORY_CARE_PROVIDER_SITE_OTHER): Payer: Managed Care, Other (non HMO) | Admitting: Family

## 2023-02-26 ENCOUNTER — Encounter: Payer: Self-pay | Admitting: Family

## 2023-02-26 VITALS — BP 102/70 | HR 60 | Temp 97.8°F | Resp 16 | Wt 109.5 lb

## 2023-02-26 DIAGNOSIS — L299 Pruritus, unspecified: Secondary | ICD-10-CM | POA: Diagnosis not present

## 2023-02-26 DIAGNOSIS — J302 Other seasonal allergic rhinitis: Secondary | ICD-10-CM

## 2023-02-26 DIAGNOSIS — T781XXD Other adverse food reactions, not elsewhere classified, subsequent encounter: Secondary | ICD-10-CM | POA: Diagnosis not present

## 2023-02-26 DIAGNOSIS — J3089 Other allergic rhinitis: Secondary | ICD-10-CM

## 2023-02-26 DIAGNOSIS — L508 Other urticaria: Secondary | ICD-10-CM | POA: Diagnosis not present

## 2023-02-26 NOTE — Progress Notes (Signed)
[address] [place] [address] Dept: [phone]  FOLLOW UP NOTE  Patient ID: Marie Hanson, female    DOB: 23-Sep-1980  Age: 42 y.o. MRN: [id number] Date of Office Visit: 02/26/2023  Assessment  Chief Complaint: Allergic Reaction (Itching after introducing sweet potato and quinoa. Lasted three days. Hives on top of hands and forehead. )  HPI Marie Hanson is a 42 year old female who presents today for an acute visit of itching after eating sweet potato and quinoa.  She was last seen on February 12, 2023 by myself for adverse food reaction, rash and nonspecific skin eruption, seasonal and perennial allergic rhinitis, and acute urticaria.  She denies any new diagnosis or surgery since her last office visit.  She reports that last week she tried eating quinoa and sweet potato for lunch.  She ate these food items around noon and approximately 5 hours later she had hives on her tops of her hands and on her forehead.  The hives on the top of her hand were not itchy, but the hives on her forehead were itchy.  She does not have photos of this on her phone.  She ate the quinoa and sweet potato for 3 days in a row and reports that the hives got worse.  When she stopped eating these foods the hives went away in 24 hours.  She reports that she knows her sweet potato lab work was negative but just wonders if her body is reacting as if it is an allergy even though it is not.  She denies any concomitant cardiorespiratory and gastro intestinal symptoms, but then does mention that she had nasal congestion while this was going on.  She denies any new medications or new products.  She was not sick when this occur and no one else had this rash.  She denies any recent bug bites, fever, chills, or joint pain.  She does continue to take Allegra 180 mg twice a day, Pepcid 10 mg twice a day, and 10 mg of Singulair at night.  She does have a second opinion with [facility] in August.  She also has an  appointment with dermatology at the end of the month, but mentions that this was originally scheduled for her acne.  She wonders what other options she has to take for the itching and to get her to be able to eat these foods she has questions about Xolair, Ketotifen, or over the counter quercetin.  She has tried Xyzal in the past and did not like that medication.  She does not take Zyrtec because it has lactose in it.  She has tried Claritin and felt like she was still itchy while taking it.  She does mention that if she sticks with the basic foods she does not have the itching from the neck up without a rash.  She does eat macadamia nuts, but this will cause itching.  She denies any concomitant cardiorespiratory and gastrointestinal symptoms.  She does mention now that she is okay with eating corn tortillas and  has added corn chips and has done good with this so far.  She does also mention though that sometimes her throat feels slightly puffy and scratchy, but never advances from that.  She does report that she has an epinephrine autoinjector device and has not had to use it since her last office visit.  She reports that she is no longer having the red spots on her forehead and around her eyes.  She  reports that they went away about a week after she last was at her office.   Drug Allergies:  Allergies  Allergen Reactions   Iodine     Review of Systems: Review of Systems  Constitutional:  Negative for chills and fever.  HENT:         Reports nasal congestion when she ate sweet potato and quinoa.  Otherwise she does not have rhinorrhea, nasal congestion, and postnasal drip  Eyes:        Denies itchy watery eyes  Respiratory:  Negative for cough, shortness of breath and wheezing.   Cardiovascular:  Negative for chest pain and palpitations.  Gastrointestinal:  Negative for abdominal pain, diarrhea, nausea and vomiting.  Skin:  Positive for itching and rash.  Neurological:  Negative for  headaches.  Endo/Heme/Allergies:  Positive for environmental allergies.     Physical Exam: BP 102/70   Pulse 60   Temp 97.8 F (36.6 C) (Temporal)   Resp 16   Wt 109 lb 8 oz (49.7 kg)   SpO2 100%   BMI 18.93 kg/m    Physical Exam Constitutional:      Appearance: Normal appearance.  HENT:     Head: Normocephalic and atraumatic.     Comments: Pharynx normal, eyes normal, ears normal, nose normal    Right Ear: Tympanic membrane, ear canal and external ear normal.     Left Ear: Tympanic membrane, ear canal and external ear normal.     Nose: Nose normal.     Mouth/Throat:     Mouth: Mucous membranes are moist.     Pharynx: Oropharynx is clear.  Eyes:     Conjunctiva/sclera: Conjunctivae normal.  Cardiovascular:     Rate and Rhythm: Regular rhythm.     Heart sounds: Normal heart sounds.  Pulmonary:     Effort: Pulmonary effort is normal.     Breath sounds: Normal breath sounds.     Comments: Lungs clear to auscultation Musculoskeletal:     Cervical back: Neck supple.  Skin:    General: Skin is warm.     Comments: No rashes or urticarial lesions noted on exposed skin  Neurological:     Mental Status: She is alert and oriented to person, place, and time.  Psychiatric:        Mood and Affect: Mood normal.        Behavior: Behavior normal.        Thought Content: Thought content normal.        Judgment: Judgment normal.     Diagnostics:  none  Assessment and Plan: 1. Adverse food reaction, subsequent encounter   2. Pruritus   3. Acute urticaria   4. Seasonal and perennial allergic rhinitis     No orders of the defined types were placed in this encounter.   Patient Instructions  Allergic rhinitis Continue allergen avoidance measures directed toward mold, dust mite, cat, and cockroach as listed below Continue antihistamines as below  Acne: resolved  Acute Urticaria:  - unclear trigger - avoid hazelnut and be careful with other tree nuts (make sure they  are not manufactured together). Also avoid grapes . -Skin testing on October 24, 2022 was negative to peanut, soybean, wheat, sesame, milk, egg, casein, shellfish mix, fish mix, and cashew with adequate controls -Lab work from May 2024 negative to codfish, halibut, [employer], tuna, salmon, mackerel, Trout, paprika/green pepper, and sesame. -Lab work from January 30, 2023 negative to or less than 0.35, the cut-off for clinically  significant allergies: Buckwheat, apple, flaxseed, milk, chocolate, barley, rice, banana, oat, almond, pistachio, pecan, macadamia nut, peanut, [place] nut, cashew, walnut, egg white, wheat, sunflower, and soybean.  Peanut components Ara H8 with 0.17.  Ara H1, 2, 3, 6, and 9 were negative.  Hazelnut was 1.25 with cor a 1 and core a 8 being positive.  Cor A9 and corA 14 were negative. -Lab work from February 12, 2023 were negative for less than 0.35, the cut off for clinically significant allergies, to black bean, broccoli, coconut, pea, bakers yeast, corn, avocado, sweet potato, white potato, and tomato.  Grape was 0.37.  Tryptase is normal ruling out mast cell disease   - for SKIN only reaction, okay to take Benadryl 2 capsules every 4 hours - for SKIN + ANY additional symptoms, OR IF concern for LIFE THREATENING reaction = Epipen Autoinjector EpiPen 0.3 mg. - If using Epinephrine autoinjector, call 911 - A food allergy action plan has been provided and discussed. - Medic Alert identification is recommended. -Discussed how I do not think that the foods she is eating is causing these delayed reactions. Agree with second opinion with [facility]  Rash/pruritus Continue Allegra 180 mg twice a day Continue Pepcid 10 mg twice a day Continue Singulair 10 mg at night Discussed possible patch testing to check for contact dermatitis. She and her husband do not feel that this is the cause and feel like it is caused by foods. Will get lab work to look for causes of pruritus (cbc with  diff, CMP and thyroid cascade. We will call you with results once they are all back   Call the clinic if this treatment plan is not working well for you   Keep follow up on 03/18/23  with Dr. Maurine Minister or sooner if needed.  Return in about 20 days (around 03/18/2023), or if symptoms worsen or fail to improve.    Thank you for the opportunity to care for this patient.  Please do not hesitate to contact me with questions.  [name], FNP [facility]

## 2023-02-27 LAB — CBC WITH DIFFERENTIAL
Basophils Absolute: 0.1 10*3/uL (ref 0.0–0.2)
Basos: 1 %
EOS (ABSOLUTE): 0 10*3/uL (ref 0.0–0.4)
Eos: 0 %
Hematocrit: 43.6 % (ref 34.0–46.6)
Hemoglobin: 14.5 g/dL (ref 11.1–15.9)
Immature Grans (Abs): 0 10*3/uL (ref 0.0–0.1)
Immature Granulocytes: 0 %
Lymphocytes Absolute: 1.4 10*3/uL (ref 0.7–3.1)
Lymphs: 21 %
MCH: 30.5 pg (ref 26.6–33.0)
MCHC: 33.3 g/dL (ref 31.5–35.7)
MCV: 92 fL (ref 79–97)
Monocytes Absolute: 0.3 10*3/uL (ref 0.1–0.9)
Monocytes: 4 %
Neutrophils Absolute: 5.1 10*3/uL (ref 1.4–7.0)
Neutrophils: 74 %
RBC: 4.75 x10E6/uL (ref 3.77–5.28)
RDW: 12.9 % (ref 11.7–15.4)
WBC: 6.9 10*3/uL (ref 3.4–10.8)

## 2023-02-27 LAB — COMPREHENSIVE METABOLIC PANEL
ALT: 28 IU/L (ref 0–32)
AST: 22 IU/L (ref 0–40)
Albumin: 4.3 g/dL (ref 3.9–4.9)
Alkaline Phosphatase: 62 IU/L (ref 44–121)
BUN/Creatinine Ratio: 21 (ref 9–23)
BUN: 20 mg/dL (ref 6–24)
Bilirubin Total: 0.2 mg/dL (ref 0.0–1.2)
CO2: 20 mmol/L (ref 20–29)
Calcium: 9.1 mg/dL (ref 8.7–10.2)
Chloride: 99 mmol/L (ref 96–106)
Creatinine, Ser: 0.95 mg/dL (ref 0.57–1.00)
Globulin, Total: 2.6 g/dL (ref 1.5–4.5)
Glucose: 83 mg/dL (ref 70–99)
Potassium: 4.5 mmol/L (ref 3.5–5.2)
Sodium: 137 mmol/L (ref 134–144)
Total Protein: 6.9 g/dL (ref 6.0–8.5)
eGFR: 78 mL/min/{1.73_m2} (ref >59)

## 2023-02-27 LAB — THYROID CASCADE PROFILE: TSH: 0.955 u[IU]/mL (ref 0.450–4.500)

## 2023-02-27 NOTE — Progress Notes (Signed)
Please let [name] know that her lab work looking for causes of pruritus (itching) is normal.

## 2023-03-04 ENCOUNTER — Ambulatory Visit: Payer: Managed Care, Other (non HMO) | Admitting: Internal Medicine

## 2023-03-08 ENCOUNTER — Ambulatory Visit (INDEPENDENT_AMBULATORY_CARE_PROVIDER_SITE_OTHER): Payer: Managed Care, Other (non HMO) | Admitting: Internal Medicine

## 2023-03-08 VITALS — BP 110/76 | Temp 97.6°F | Wt 110.2 lb

## 2023-03-08 DIAGNOSIS — L508 Other urticaria: Secondary | ICD-10-CM

## 2023-03-08 NOTE — Progress Notes (Signed)
Established Patient Office Visit     CC/Reason for Visit: Recurrent urticaria  HPI: Marie Hanson is a 42 y.o. female who is coming in today for the above mentioned reasons.  For the past few months she has been dealing with recurrent urticaria.  She associates this with food.  She has been in close contact with allergist and multiple recent visits.  She has had multiple food panel testing.  She is on twice daily antihistamine, H2 blockers.   Past Medical/Surgical History: Past Medical History:  Diagnosis Date   Anxiety    Depression    Eczema    Nipple pain x's 2 months   Rosacea    Urticaria     Past Surgical History:  Procedure Laterality Date   AUGMENTATION MAMMAPLASTY     breast enhancement & removal    Social History:  reports that she has never smoked. She has never been exposed to tobacco smoke. She has never used smokeless tobacco. She reports that she does not currently use alcohol. She reports that she does not use drugs.  Allergies: Allergies  Allergen Reactions   Iodine     Family History:  Family History  Problem Relation Age of Onset   Urticaria Mother    Eczema Mother    Allergic rhinitis Mother    Diabetes Mother    Depression Father    Anxiety disorder Father    BRCA 1/2 Neg Hx    Breast cancer Neg Hx      Current Outpatient Medications:    ADZENYS XR-ODT 6.3 MG TBED, Take 1 tablet by mouth daily., Disp: , Rfl:    Azelaic Acid 15 % gel, AFTER SKIN IS THOROUGHLY WASHED AND PATTED DRY, GENTLY AND THOROUGHLY APPLY A THIN FILM TO AFFECTED AREA IN THE MORNING AND AT NIGHT, Disp: 50 g, Rfl: 2   clindamycin (CLINDAGEL) 1 % gel, Apply topically 2 (two) times daily., Disp: 30 g, Rfl: 0   doxycycline (VIBRAMYCIN) 50 MG capsule, Take 50 mg by mouth daily., Disp: , Rfl:    famotidine (PEPCID) 10 MG tablet, Take 10 mg by mouth 2 (two) times daily., Disp: , Rfl:    fexofenadine (ALLEGRA) 180 MG tablet, Take 180 mg by mouth daily., Disp: ,  Rfl:    loratadine (CLARITIN) 10 MG tablet, Take 10 mg by mouth daily., Disp: , Rfl:    montelukast (SINGULAIR) 10 MG tablet, Take 1 tablet (10 mg total) by mouth at bedtime., Disp: 30 tablet, Rfl: 5   Multiple Vitamin (MULTIVITAMIN) tablet, Take 1 tablet by mouth daily., Disp: , Rfl:    [name] 3-0.02 MG tablet, TAKE 1 TABLET BY MOUTH EVERY DAY, Disp: 84 tablet, Rfl: 1   Omega-3 Fatty Acids (FISH OIL) 1000 MG CAPS, Take by mouth., Disp: , Rfl:    RESTASIS 0.05 % ophthalmic emulsion, , Disp: , Rfl:    spironolactone (ALDACTONE) 25 MG tablet, Take 1 tablet (25 mg total) by mouth daily., Disp: 90 tablet, Rfl: 1   tretinoin (RETIN-A) 0.025 % cream, Apply 1 Application topically at bedtime., Disp: , Rfl:   Review of Systems:  Negative unless indicated in HPI.   Physical Exam: Vitals:   03/08/23 0953  BP: 110/76  Temp: 97.6 F (36.4 C)  TempSrc: Oral  Weight: 110 lb 3.2 oz (50 kg)    Body mass index is 19.05 kg/m.   Physical Exam Vitals reviewed.  Constitutional:      Appearance: Normal appearance.  HENT:  Head: Normocephalic and atraumatic.  Skin:    General: Skin is warm and dry.  Neurological:     General: No focal deficit present.     Mental Status: She is alert and oriented to person, place, and time.  Psychiatric:        Mood and Affect: Mood normal.        Behavior: Behavior normal.        Thought Content: Thought content normal.        Judgment: Judgment normal.      Impression and Plan:  Chronic urticaria  -She continues to have issues with chronic urticaria that she seems to associate with food.  It would appear tree nuts and grapes are the only things that have resulted positive in her food panels.  She has had extensive allergen testing. -She is taking twice daily Allegra, Pepcid.  I see note from recent allergy visit willing to consider Xolair.  She has follow-up with another allergist for second opinion coming up.   Time spent:22 minutes reviewing  chart, interviewing and examining patient and formulating plan of care.     [name], MD [facility]

## 2023-03-12 ENCOUNTER — Encounter: Payer: Self-pay | Admitting: Dermatology

## 2023-03-12 ENCOUNTER — Encounter: Payer: Self-pay | Admitting: Internal Medicine

## 2023-03-12 ENCOUNTER — Ambulatory Visit (INDEPENDENT_AMBULATORY_CARE_PROVIDER_SITE_OTHER): Payer: Managed Care, Other (non HMO) | Admitting: Dermatology

## 2023-03-12 VITALS — BP 125/82

## 2023-03-12 DIAGNOSIS — M2550 Pain in unspecified joint: Secondary | ICD-10-CM

## 2023-03-12 DIAGNOSIS — R21 Rash and other nonspecific skin eruption: Secondary | ICD-10-CM | POA: Diagnosis not present

## 2023-03-12 DIAGNOSIS — R233 Spontaneous ecchymoses: Secondary | ICD-10-CM | POA: Diagnosis not present

## 2023-03-12 DIAGNOSIS — L299 Pruritus, unspecified: Secondary | ICD-10-CM

## 2023-03-12 DIAGNOSIS — I776 Arteritis, unspecified: Secondary | ICD-10-CM | POA: Diagnosis not present

## 2023-03-12 DIAGNOSIS — R6889 Other general symptoms and signs: Secondary | ICD-10-CM

## 2023-03-12 NOTE — Progress Notes (Signed)
New Patient Visit   Subjective  Marie Hanson is a 42 y.o. female who presents for the following: Acne on the face since January after receiving allergy shots. She reports having Covid in December 2023. She is currently using Clindamycin gel, Azelaic acid, and Tretinoin 0.025% for her face. She has been using these products for a few years with Clindamycin being the newest. She has Tretinoin 0.05% she uses's on the chest and hands for anti-aging. She is looking to establish care with Dr. Onalee Hua and get her prescriptions refilled.  She reports feeling itchy and getting hives on the top of hands. Itch can be a 2 on a scale from 1-10. She reports this may be food related. Her allergist performed testing. She said there were low level. She stopped eating Hazelnuts, potato, and grapes, bananas. She had developed petechiae after having potato.  Dr. Kermit Balo HPI The patient, Marie Hanson, presents with a history of acne and recent petechiae on her forehead, temple, and hands. She reports having acne since she was 42 years old and has been on birth control pills for acne management. She has been using azelaic acid for at least 8 years and clindamycin since January for a flare-up. She also uses tretinoin for anti-aging purposes, which was prescribed four years ago.  Marie Hanson experienced a cluster of itchy acne-like eruptions on her forehead after receiving allergy injections. She believes the injections triggered the flare-up, and her symptoms improved after stopping the injections. She is currently on Singulair, Pepcid, and Allegra for histamine management.  The patient reports that she develops tiny red dots on her skin after eating, which disappear after a few days. She has been on a limited diet, cutting out foods such as peanut butter, bananas, almonds, and tomatoes. She has a history of environmental allergies and has never had food allergies before. She also experienced COVID in December and started allergy  shots in January.  Marie Hanson experiences itchiness on her forehead after eating, rating the itch as a 2 out of 10 in severity. The itch lasts for about 15 minutes. She has not been able to pinpoint any specific food triggers since eliminating the major food items from her diet. She also reports dry eyes and joint pain.  The following portions of the chart were reviewed this encounter and updated as appropriate: medications, allergies, medical history  Review of Systems:  No other skin or systemic complaints except as noted in HPI or Assessment and Plan.  Objective  Well appearing patient in no apparent distress; mood and affect are within normal limits.        A focused examination was performed of the following areas: face  Relevant exam findings are noted in the Assessment and Plan.    Assessment & Plan   1. Acneiform-like Eruption and Petechiae - Assessment: No active acne eruptions present. Concern for petechiae potentially related to current medications or underlying autoimmune inflammatory conditions. - Plan: Discontinue clindamycin and benzoyl peroxide. Pause tretinoin for 2-3 weeks to evaluate its role in the development of petechiae. Order a comprehensive workup for autoimmune markers to investigate possible vasculitis or other autoimmune conditions affecting the blood vessels. Reevaluate the situation and consider resuming tretinoin based on lab results and symptom resolution.  2. Itching and Possible Food Sensitivities - Assessment: Ongoing itching potentially linked to food sensitivities. - Plan: Continue with current regimen of histamine blockers (Singulair, Pepcid, and Allegra). Monitor for any associations between itching episodes and specific food intake. Await laboratory results for  eosinophils and histamine levels to explore the possibility of Mast Cell Activation Syndrome (MCAS). Schedule a follow-up with a second allergist in August for further assessment and  management.  3. Dry Eyes and Joint Pain - Assessment: Patient experiencing dry eyes and joint pain, suggesting possible autoimmune etiology. - Plan: Maintain low-dose doxycycline for dry eyes. Expand lab workup to include tests for Sjogren's syndrome and other autoimmune disorders as potential underlying causes of symptoms.  4. Tretinoin Use for Anti-Aging - Assessment: Use of tretinoin for anti-aging purposes under review due to potential link to petechiae. - Plan: Temporarily halt tretinoin for 2-3 weeks to assess its impact on the development of petechiae. Decision on the continuation of tretinoin to be made after reviewing lab results and assessing symptom resolution.  Follow-up: - Arrange a follow-up consultation post-lab results to discuss the outcomes and adjust the treatment strategy as needed.   Vasculitis (HCC)  Related Procedures ANA W/Rfx to all if Positive C-ANCA Titer P-ANCA Titer Sjogren's syndrome antibods(ssa + ssb) Sedimentation Rate C-reactive Protein Rheumatoid Factor Histamine Determination, Blood Antiphospholipid Syndrome Diagnostic Panel IgA Tissue transglutaminase, IgA Fibrinogen CBC with Differential/Platelets CMP Alpha galactosidase    Return in about 1 month (around 04/12/2023).  Jaclynn Guarneri, CMA, am acting as scribe for [employer], DO.   Documentation: I have reviewed the above documentation for accuracy and completeness, and I agree with the above.  Langston Reusing, DO

## 2023-03-12 NOTE — Patient Instructions (Addendum)
Hello Marie Hanson,  Thank you for visiting my office today and for your commitment to improving your health. It was a pleasure to meet you and discuss your ongoing skin concerns. Below is a summary of the key points from our consultation and the next steps for your treatment plan:  - Medications and Skin Care:   - Discontinue Clindamycin and Benzoyl Peroxide as there are no active acne eruptions.   - Discontinue  using Azelaic Acid as previously prescribed.   - Take a break from Tretinoin for 4 weeks to observe if there is any improvement in the petechiae. We will review the possibility of restarting it depending on the lab results and skin response.   - Continue using your regular face wash and moisturizer.  - Diet and Allergies:   - Continue avoiding foods identified as triggers (especially hazelnuts and grapes).   - Maintain a limited diet as recommended by your primary care provider to manage histamine levels.  - Laboratory Tests and Examinations:   - Comprehensive lab workup for autoimmune markers to explore potential causes of petechiae and to check if COVID-19 might have triggered an autoimmune condition.   - Specific tests will include checking for eosinophils and histamine levels to assess for possible Mast Cell Activation Syndrome (MCAS).  - Follow-Up Appointments:   - Schedule a follow-up appointment after the lab results are available to discuss the findings and adjust your treatment plan accordingly.   - Continue with your planned visit to a second allergist in August to further investigate potential food-related issues.  - Educational Materials:   - Provided handouts on managing skin care with topical treatments and understanding the impact of diet on skin conditions.  Please ensure to follow the outlined steps and keep your follow-up appointments. If you have any questions or if there is anything else you need in the meantime, do not hesitate to contact our  office.        Due to recent changes in healthcare laws, you may see results of your pathology and/or laboratory studies on MyChart before the doctors have had a chance to review them. We understand that in some cases there may be results that are confusing or concerning to you. Please understand that not all results are received at the same time and often the doctors may need to interpret multiple results in order to provide you with the best plan of care or course of treatment. Therefore, we ask that you please give [place] 2 business days to thoroughly review all your results before contacting the office for clarification. Should we see a critical lab result, you will be contacted sooner.   If You Need Anything After Your Visit  If you have any questions or concerns for your doctor, please call our main line at [phone] If no one answers, please leave a voicemail as directed and we will return your call as soon as possible. Messages left after 4 pm will be answered the following business day.   You may also send [place] a message via MyChart. We typically respond to MyChart messages within 1-2 business days.  For prescription refills, please ask your pharmacy to contact our office. Our fax number is [phone].  If you have an urgent issue when the clinic is closed that cannot wait until the next business day, you can page your doctor at the number below.    Please note that while we do our best to be available for urgent issues outside of office  hours, we are not available 24/7.   If you have an urgent issue and are unable to reach [place], you may choose to seek medical care at your doctor's office, retail clinic, urgent care center, or emergency room.  If you have a medical emergency, please immediately call 911 or go to the emergency department. In the event of inclement weather, please call our main line at [phone] for an update on the status of any delays or closures.  Dermatology  Medication Tips: Please keep the boxes that topical medications come in in order to help keep track of the instructions about where and how to use these. Pharmacies typically print the medication instructions only on the boxes and not directly on the medication tubes.   If your medication is too expensive, please contact our office at [phone] or send [place] a message through MyChart.   We are unable to tell what your co-pay for medications will be in advance as this is different depending on your insurance coverage. However, we may be able to find a substitute medication at lower cost or fill out paperwork to get insurance to cover a needed medication.   If a prior authorization is required to get your medication covered by your insurance company, please allow [place] 1-2 business days to complete this process.  Drug prices often vary depending on where the prescription is filled and some pharmacies may offer cheaper prices.  The website www.goodrx.com contains coupons for medications through different pharmacies. The prices here do not account for what the cost may be with help from insurance (it may be cheaper with your insurance), but the website can give you the price if you did not use any insurance.  - You can print the associated coupon and take it with your prescription to the pharmacy.  - You may also stop by our office during regular business hours and pick up a GoodRx coupon card.  - If you need your prescription sent electronically to a different pharmacy, notify our office through [facility] or by phone at [phone]

## 2023-03-13 ENCOUNTER — Other Ambulatory Visit: Payer: Self-pay | Admitting: Internal Medicine

## 2023-03-13 DIAGNOSIS — L700 Acne vulgaris: Secondary | ICD-10-CM

## 2023-03-16 NOTE — Progress Notes (Signed)
FOLLOW UP Date of Service/Encounter:  03/18/23  Subjective:  Marie Hanson (DOB: [birth year]) is a 42 y.o. female who [facility] in re-evaluation of the following: seasonal and perennial allergic rhinitis, chronic urticaria, rashes, concern for allergic reaction History obtained from: chart review and patient.  For Review, LV was on 02/26/23  with [name], FNP seen for routine follow-up. See below for summary of history and diagnostics.   Therapeutic plans/changes recommended: continue allegra BID, pepcid BID, singulair nightly, epipen PRN ----------------------------------------------------- Pertinent History/Diagnostics:  Allergic Rhinitis:  Congestion and pain, worse on right. + itchy eyes with occasional drainage. Recent ENT visit with normal rhinoscopy. Moved to [place] 3.5 years prior. Symptoms worse in spring and fall.  Horse has been a trigger.  Has history of nasal cauterization. RUSH AIT on 11/13/22. Last injection 12/24/22-stopped due to acne which occurred near time of injections as well as localized reactions despite epi wash and extra antihistamine. - SPT environmental panel (10/24/22): positive to grass pollen, weed pollen, tree pollen, indoor/outdoor molds, dog, horse, tobacco leaf -intradermal testing positive to cat and ragweed Eczema: occasionally, flares last year around her nipple. Normal mammogram. She did see a dermatologist and was given a non-steroid cream that didn't work. Eucerin urea cream did work. She has also had a spot on her left eyelid, but none in over a year.  Chronic urticaria: Has random episodes of urticaria, unclear triggers.  - Food allergy SPT 10/24/22-most common foods negative, obtained for patient reassurance. - Extensive food allergy testing performed via bloodwork 01/30/23 per patient request: postive to hazelnut only (1.25, cor A8 2.02, A1 0.49), negative or below positive threshold (<  0.35) to peanuts, other tree nuts, sunflower seeds, wheat, egg white, oat, banana, rice, barley, chocolate, milk, flaxseed, apple, buckwheat - Patient returned 02/12/23, evaluated by [name], FNP and additional lab work requested and completed: normal baseline tryptase 3.0, low level positive to grape (0.37), negative or below positive threshold of 0.35 to tomato, white potato, sweet potato, avocado, corn, yeast, green pea, coconut, broccoli, black bean - FU on 02/26/23: CBCd, CMP and thyroid studies normal - Extensive counseling on CIU and non-allergic nature provided. Patient now on allegra 180 mg BID, pepcid 10 mg BID, singulair nightly. Xolair has been offered. - patient planning on second opinion for hives at [facility] allergy. - evaluated by Derm 03/12/23: acne: well controlled, discontinued meds; petechia on forehead with normal cuticle exam. Plan: avoid triggering foods, continue oral AH, autoimmune labs ordered.  Concern for food allergy:  Hives when eating sushi - normally orders [place] roll or similar Hives from veggie burger with red bell pepper Salmon-nausea - serum IgE sesame negative, paprika/green pepper negative, cod, halibut, pike, tuna, salmon, mackerel, trout negative - negative tTG 01/21/22 --------------------------------------------------- Today presents for follow-up. She developed nausea and upset stomach after eating an allergy brand cookies and another time from rotisserie brand chicken.  She did not vomit, but this is a new symptom that she has not had before.  She is having some reaction so nher face that she states are itchy. Pictures consistent with acne. She is taking allegra twice daily and Pepcid twice daily.  She was taking Singulair over one month, but felt her mood was getting low, so she stopped it. She has been off now for 4-5 days. She also has a rash on her leg that is more consistent with petechia.  She does have labs pending from dermatologist to  look at autoimmune labs. She is interested in Xolair.   All medications reviewed by clinical staff and updated in chart. No new pertinent medical or surgical history except as noted in HPI.  ROS: All others negative except as noted per HPI.   Objective:  BP 116/72   Pulse 80   Temp 98 F (36.7 C) (Temporal)   Resp 16   Wt 112 lb 8 oz (51 kg)   SpO2 100%   BMI 19.44 kg/m  Body mass index is 19.44 kg/m. Physical Exam: General Appearance:  Alert, cooperative, no distress, appears stated age  Head:  Normocephalic, without obvious abnormality, atraumatic  Eyes:  Conjunctiva clear, EOM's intact  Ears EACs normal bilaterally and normal TMs bilaterally  Nose: Nares normal, hypertrophic turbinates, normal mucosa, and no visible anterior polyps  Throat: Lips, tongue normal; teeth and gums normal, normal posterior oropharynx  Neck: Supple, symmetrical  Lungs:   clear to auscultation bilaterally, Respirations unlabored, no coughing  Heart:  regular rate and rhythm and no murmur, Appears well perfused  Extremities: No edema  Skin: Area of petechia on left lower extermity, no acne on face today, few pinpoint papular urticaria on shoulders  Neurologic: No gross deficits   Labs:  Lab Orders         Chronic Urticaria      Assessment/Plan   Discussed that her reactions are less consistent with food allergic reactions and more consistent with chronic nonallergic urticaria.  Her recent episodes of extreme nausea could be a sign of MCAS though her baseline tryptase was normal. We do not have one during an attack to look at rate of rise.  The nausea could be entirely unrelated.  She continues to have acne lesions on her face, and unclear how this is allergy related. She does have some petechia on her leg, and agree with autoimmune work-up that is pending from dermatology. She is an excellent candidate for Xolair which was given as a sample today.  Allergic rhinitis-not at goal Continue  allergen avoidance measures directed toward mold, dust mite, cat, and cockroach as listed below Continue antihistamines as below  Acne: not at goal Continue management as per dermatology  Chronic Urticaria: not at goal - unclear trigger; nonallergic - avoid hazelnut and be careful with other tree nuts (make sure they are not manufactured together). Also avoid grapes for now. -Most labs have been negative for foods with exception of hazelnut and ver low level positive to grape-consider challenge to grapes. Tryptase is normal ruling out mast cell disease Labs today: chronic urticaria assay  Continue Allegra 180 mg twice a day Continue Pepcid 10 mg twice a day Xolair sample today 300 mg-plan to continue every 4 weeks.  We will submit paperwork for Xolair. You will hear from our biologics coordinator Tammy VonCannon. Please answer her phone calls to ensure a seamless approval process. Cromolyn take 5 to 10 mL prior to meals if recurrent nausea becomes an issue  Other: biologic given in clinic today  Tonny Bollman, MD  [facility]

## 2023-03-18 ENCOUNTER — Ambulatory Visit: Payer: Managed Care, Other (non HMO) | Admitting: Internal Medicine

## 2023-03-18 ENCOUNTER — Encounter: Payer: Self-pay | Admitting: Dermatology

## 2023-03-18 ENCOUNTER — Encounter: Payer: Self-pay | Admitting: Internal Medicine

## 2023-03-18 ENCOUNTER — Other Ambulatory Visit: Payer: Self-pay

## 2023-03-18 VITALS — BP 116/72 | HR 80 | Temp 98.0°F | Resp 16 | Wt 112.5 lb

## 2023-03-18 DIAGNOSIS — L508 Other urticaria: Secondary | ICD-10-CM | POA: Diagnosis not present

## 2023-03-18 DIAGNOSIS — J3089 Other allergic rhinitis: Secondary | ICD-10-CM

## 2023-03-18 DIAGNOSIS — L700 Acne vulgaris: Secondary | ICD-10-CM | POA: Diagnosis not present

## 2023-03-18 DIAGNOSIS — R233 Spontaneous ecchymoses: Secondary | ICD-10-CM | POA: Diagnosis not present

## 2023-03-18 DIAGNOSIS — J302 Other seasonal allergic rhinitis: Secondary | ICD-10-CM

## 2023-03-18 MED ORDER — OMALIZUMAB 300 MG/2  ML ~~LOC~~ SOSY
300.0000 mg | PREFILLED_SYRINGE | Freq: Once | SUBCUTANEOUS | Status: AC
Start: 2023-03-18 — End: 2023-03-18
  Administered 2023-03-18: 300 mg via SUBCUTANEOUS

## 2023-03-18 MED ORDER — CROMOLYN SODIUM 100 MG/5ML PO CONC: 200.0000 mg | Freq: Three times a day (TID) | ORAL | 3 refills | Status: DC

## 2023-03-18 NOTE — Patient Instructions (Addendum)
Allergic rhinitis Continue allergen avoidance measures directed toward mold, dust mite, cat, and cockroach as listed below Continue antihistamines as below  Acne: Continue management as per dermatology  Chronic Urticaria:  - unclear trigger; nonallergic - avoid hazelnut and be careful with other tree nuts (make sure they are not manufactured together). Also avoid grapes for now. -Most labs have been negative for foods with exception of hazelnut and ver low level positive to grape-consider challenge to grapes. Tryptase is normal ruling out mast cell disease Labs today: chronic urticaria assay  Continue Allegra 180 mg twice a day Continue Pepcid 10 mg twice a day Xolair sample today 300 mg-plan to continue every 4 weeks.  We will submit paperwork for Xolair. You will hear from our biologics coordinator Tammy VonCannon. Please answer her phone calls to ensure a seamless approval process. Cromolyn take 5 to 10 mL prior to meals if recurrent nausea becomes an issue   Follow up : 3 months, sooner if needed It was a pleasure meeting you in clinic today! Thank you for allowing me to participate in your care.  Tonny Bollman, MD [facility]

## 2023-03-19 NOTE — Progress Notes (Signed)
Immunotherapy   Patient Details  Name: Marie Hanson MRN: [id number] Date of Birth: 31-Jul-1981  03/18/23  Odis Luster Deerman started injections for  Xolair  Frequency: Every 4 weeks  Consent signed and patient instructions given. Patient waited 30 minutes in office after xolair injection and signed consent form for injection.   Orson Aloe 03/19/2023, 6:59 PM

## 2023-03-20 ENCOUNTER — Telehealth: Payer: Self-pay | Admitting: *Deleted

## 2023-03-20 ENCOUNTER — Ambulatory Visit (HOSPITAL_BASED_OUTPATIENT_CLINIC_OR_DEPARTMENT_OTHER): Payer: Managed Care, Other (non HMO) | Admitting: Orthopaedic Surgery

## 2023-03-20 ENCOUNTER — Other Ambulatory Visit: Payer: Self-pay | Admitting: Internal Medicine

## 2023-03-20 NOTE — Telephone Encounter (Signed)
-----   Message from [facility] Diandra D sent at 03/19/2023  7:02 PM EDT ----- Regarding: xolair for hives Patient received a 300mg  of xolair. Patient is due back in 4 weeks per Dr. Maurine Minister

## 2023-03-20 NOTE — Telephone Encounter (Signed)
Spoke to patient and advised approval and submit and after 3 injs can go to home admin if she would like to

## 2023-03-20 NOTE — Telephone Encounter (Signed)
L/M for patient to contact me to advise aprpoval, copay card and submit to [facility] for Xolair

## 2023-03-27 ENCOUNTER — Ambulatory Visit (HOSPITAL_BASED_OUTPATIENT_CLINIC_OR_DEPARTMENT_OTHER): Payer: Managed Care, Other (non HMO) | Admitting: Orthopaedic Surgery

## 2023-03-27 ENCOUNTER — Ambulatory Visit (HOSPITAL_BASED_OUTPATIENT_CLINIC_OR_DEPARTMENT_OTHER): Payer: Managed Care, Other (non HMO)

## 2023-03-27 DIAGNOSIS — M7501 Adhesive capsulitis of right shoulder: Secondary | ICD-10-CM | POA: Diagnosis not present

## 2023-03-27 DIAGNOSIS — M25531 Pain in right wrist: Secondary | ICD-10-CM

## 2023-03-27 NOTE — Progress Notes (Signed)
Chief Complaint: Right wrist, right shoulder pain     History of Present Illness:    Marie Hanson is a 42 y.o. female right-hand-dominant presents with right shoulder pain which is predominantly in the joint and sometimes radiating down the arm particular with certain movements.  She is also experiencing tenderness about the dorsal aspect of the right wrist as well particular with crocheting which is her body.    Surgical History:   None  PMH/PSH/Family History/Social History/Meds/Allergies:    Past Medical History:  Diagnosis Date   Anxiety    Depression    Eczema    Nipple pain x's 2 months   Rosacea    Urticaria    Past Surgical History:  Procedure Laterality Date   AUGMENTATION MAMMAPLASTY     breast enhancement & removal   Social History   Socioeconomic History   Marital status: Married    Spouse name: Not on file   Number of children: Not on file   Years of education: Not on file   Highest education level: Bachelor's degree (e.g., BA, AB, BS)  Occupational History   Not on file  Tobacco Use   Smoking status: Never    Passive exposure: Never   Smokeless tobacco: Never  Vaping Use   Vaping status: Never Used  Substance and Sexual Activity   Alcohol use: Not Currently   Drug use: Never   Sexual activity: Yes    Partners: Male    Birth control/protection: OCP  Other Topics Concern   Not on file  Social History Narrative   Not on file   Social Determinants of Health   Financial Resource Strain: Low Risk  (11/14/2022)   Overall Financial Resource Strain (CARDIA)    Difficulty of Paying Living Expenses: Not very hard  Food Insecurity: No Food Insecurity (11/14/2022)   Hunger Vital Sign    Worried About Running Out of Food in the Last Year: Never true    Ran Out of Food in the Last Year: Never true  Transportation Needs: No Transportation Needs (11/14/2022)   PRAPARE - Administrator, Civil Service  (Medical): No    Lack of Transportation (Non-Medical): No  Physical Activity: Sufficiently Active (11/14/2022)   Exercise Vital Sign    Days of Exercise per Week: 6 days    Minutes of Exercise per Session: 60 min  Stress: Stress Concern Present (11/14/2022)   [employer] of Occupational Health - Occupational Stress Questionnaire    Feeling of Stress : Rather much  Social Connections: Moderately Isolated (11/14/2022)   Social Connection and Isolation Panel [NHANES]    Frequency of Communication with Friends and Family: Three times a week    Frequency of Social Gatherings with Friends and Family: Once a week    Attends Religious Services: Never    Database administrator or Organizations: No    Attends Engineer, structural: Not on file    Marital Status: Married   Family History  Problem Relation Age of Onset   Urticaria Mother    Eczema Mother    Allergic rhinitis Mother    Diabetes Mother    Depression Father    Anxiety disorder Father    BRCA 1/2 Neg Hx    Breast cancer Neg Hx    Allergies  Allergen Reactions   Iodine    Current Outpatient Medications  Medication Sig Dispense Refill   Azelaic Acid 15 % gel AFTER SKIN IS THOROUGHLY WASHED AND PATTED DRY, GENTLY AND THOROUGHLY APPLY A THIN FILM TO AFFECTED AREA IN THE MORNING AND AT NIGHT 50 g 2   clindamycin (CLINDAGEL) 1 % gel Apply topically 2 (two) times daily. 30 g 0   cromolyn (GASTROCROM) 100 MG/5ML solution Take 10 mLs (200 mg total) by mouth 4 (four) times daily -  before meals and at bedtime. 480 mL 3   doxycycline (VIBRAMYCIN) 50 MG capsule Take 50 mg by mouth daily.     EPINEPHrine 0.3 mg/0.3 mL IJ SOAJ injection SMARTSIG:0.3 Milligram(s) IM PRN     famotidine (PEPCID) 10 MG tablet Take 10 mg by mouth 2 (two) times daily.     fexofenadine (ALLEGRA) 180 MG tablet Take 180 mg by mouth daily.     loratadine (CLARITIN) 10 MG tablet Take 10 mg by mouth daily. (Patient not taking: Reported on 03/12/2023)      MIEBO 1.338 GM/ML SOLN Apply to eye.     montelukast (SINGULAIR) 10 MG tablet Take 1 tablet (10 mg total) by mouth at bedtime. 30 tablet 5   Multiple Vitamin (MULTIVITAMIN) tablet Take 1 tablet by mouth daily. (Patient not taking: Reported on 03/12/2023)     [name] 3-0.02 MG tablet TAKE 1 TABLET BY MOUTH EVERY DAY 84 tablet 1   Omega-3 Fatty Acids (FISH OIL) 1000 MG CAPS Take by mouth. (Patient not taking: Reported on 03/12/2023)     RESTASIS 0.05 % ophthalmic emulsion      spironolactone (ALDACTONE) 25 MG tablet TAKE 1 TABLET (25 MG TOTAL) BY MOUTH DAILY. 90 tablet 0   tretinoin (RETIN-A) 0.025 % cream Apply 1 Application topically at bedtime. (Patient not taking: Reported on 03/12/2023)     No current facility-administered medications for this visit.   No results found.  Review of Systems:   A ROS was performed including pertinent positives and negatives as documented in the HPI.  Physical Exam :   Constitutional: NAD and appears stated age Neurological: Alert and oriented Psych: Appropriate affect and cooperative There were no vitals taken for this visit.   Comprehensive Musculoskeletal Exam:    Tenderness about the right wrist dorsally with mildly positive Finkelstein's test.  Also tender about the ECU.  Imaging:   Xray (3 views right shoulder): Normal    I personally reviewed and interpreted the radiographs.   Assessment:   42 y.o. female with right shoulder pain consistent with early adhesive capsulitis as well as right wrist de Quervain's and ECU tendinitis.  At this time I did recommend an over-the-counter wrist splint as well as a topical NSAID which she can use for the shoulder and the wrist.  I did recommend that in 6 weeks if she is not having significant improvement I would recommend a follow-up visit for an ultrasound-guided shoulder injection.  Plan :    -Return to clinic as needed     I personally saw and evaluated the patient, and participated in the  management and treatment plan.  Huel Cote, MD Attending Physician, Orthopedic Surgery  This document was dictated using Dragon voice recognition software. A reasonable attempt at proof reading has been made to minimize errors.

## 2023-03-28 ENCOUNTER — Telehealth: Payer: Self-pay | Admitting: *Deleted

## 2023-03-28 NOTE — Telephone Encounter (Signed)
L/m for patient Xolair delivery to clinic 8/21 and to call clinic to make next appt due 9/3

## 2023-04-08 ENCOUNTER — Ambulatory Visit: Payer: Managed Care, Other (non HMO) | Admitting: Physical Therapy

## 2023-04-16 ENCOUNTER — Encounter: Payer: Self-pay | Admitting: Dermatology

## 2023-04-16 ENCOUNTER — Ambulatory Visit (INDEPENDENT_AMBULATORY_CARE_PROVIDER_SITE_OTHER): Payer: Managed Care, Other (non HMO) | Admitting: Dermatology

## 2023-04-16 ENCOUNTER — Ambulatory Visit: Payer: Managed Care, Other (non HMO) | Admitting: *Deleted

## 2023-04-16 DIAGNOSIS — L501 Idiopathic urticaria: Secondary | ICD-10-CM

## 2023-04-16 DIAGNOSIS — L700 Acne vulgaris: Secondary | ICD-10-CM

## 2023-04-16 MED ORDER — OMALIZUMAB 150 MG/ML ~~LOC~~ SOSY
300.0000 mg | PREFILLED_SYRINGE | SUBCUTANEOUS | Status: AC
Start: 2023-04-16 — End: ?
  Administered 2023-04-16 – 2023-05-14 (×2): 300 mg via SUBCUTANEOUS

## 2023-04-16 NOTE — Patient Instructions (Addendum)
Hello Miss [name],  Thank you for visiting [place] today. We are committed to supporting you in improving your skin health. Here is a summary of the key instructions from today's consultation:  - Allergies and Sensitivities:   - Avoid products containing Benzoyl Peroxide due to allergy.   - Also avoid Clindamycin Lotion  - Medications and Treatments:     - Current Inflammation / Irritation: Use Opzelura sample every morning and every evening for two weeks, followed by your regular moisturizer.  After 2 weeks:   - Morning Routine: Restart Azelaic Acid for one week then   - Night Routine: Resume Tretinoin treatment, applying a pea-sized amount two to three times per week after washing your face.    - Cleansing and Moisturizing: Maintain use of First Aid Beauty Cleanser and CeraVe cream.   - Hydration: Apply pure Hyaluronic Acid before moisturizing for added hydration.  - Sun Protection:   - Continue applying sunscreen in the morning as part of your skincare routine.  - Oral Medications:   - Birth Control: Continue [name] for its acne-managing properties.   - Doxycycline: Currently on 50 mg; temporarily adjust to 100 mg for flare-ups, then revert to 50 mg.  - Follow-Up Care:   - Schedule a follow-up in two months to assess progress and discuss the potential addition of Spironolactone.   - We will Consider transitioning to Arazlo (Tazarotene) once your skin has stabilized on Tretinoin.  Please adhere to these instructions and keep [place] informed about your progress. We are looking forward to seeing the improvements in your skin health.  Warm regards,  Dr. Langston Reusing, Dermatologist   Important Information  Due to recent changes in healthcare laws, you may see results of your pathology and/or laboratory studies on MyChart before the doctors have had a chance to review them. We understand that in some cases there may be results that are confusing or concerning to you. Please understand that  not all results are received at the same time and often the doctors may need to interpret multiple results in order to provide you with the best plan of care or course of treatment. Therefore, we ask that you please give [place] 2 business days to thoroughly review all your results before contacting the office for clarification. Should we see a critical lab result, you will be contacted sooner.   If You Need Anything After Your Visit  If you have any questions or concerns for your doctor, please call our main line at [phone] If no one answers, please leave a voicemail as directed and we will return your call as soon as possible. Messages left after 4 pm will be answered the following business day.   You may also send [place] a message via MyChart. We typically respond to MyChart messages within 1-2 business days.  For prescription refills, please ask your pharmacy to contact our office. Our fax number is [phone].  If you have an urgent issue when the clinic is closed that cannot wait until the next business day, you can page your doctor at the number below.    Please note that while we do our best to be available for urgent issues outside of office hours, we are not available 24/7.   If you have an urgent issue and are unable to reach [place], you may choose to seek medical care at your doctor's office, retail clinic, urgent care center, or emergency room.  If you have a medical emergency, please immediately call 911  or go to the emergency department. In the event of inclement weather, please call our main line at [phone] for an update on the status of any delays or closures.  Dermatology Medication Tips: Please keep the boxes that topical medications come in in order to help keep track of the instructions about where and how to use these. Pharmacies typically print the medication instructions only on the boxes and not directly on the medication tubes.   If your medication is too expensive,  please contact our office at [phone] or send [place] a message through MyChart.   We are unable to tell what your co-pay for medications will be in advance as this is different depending on your insurance coverage. However, we may be able to find a substitute medication at lower cost or fill out paperwork to get insurance to cover a needed medication.   If a prior authorization is required to get your medication covered by your insurance company, please allow [place] 1-2 business days to complete this process.  Drug prices often vary depending on where the prescription is filled and some pharmacies may offer cheaper prices.  The website www.goodrx.com contains coupons for medications through different pharmacies. The prices here do not account for what the cost may be with help from insurance (it may be cheaper with your insurance), but the website can give you the price if you did not use any insurance.  - You can print the associated coupon and take it with your prescription to the pharmacy.  - You may also stop by our office during regular business hours and pick up a GoodRx coupon card.  - If you need your prescription sent electronically to a different pharmacy, notify our office through [facility] or by phone at [phone]

## 2023-04-16 NOTE — Progress Notes (Signed)
Follow-Up Visit   Subjective  Marie Hanson is a 42 y.o. female who presents for the following: Vasculitis  Patient present today for follow up visit for Vasculitis. Patient was last evaluated on 03/12/23. Patient reports sxs are Flared. Patient reports no medication changes. Pt reports after starting Xolair, she hasn't had as much itching and irritation of skin.   presents with worsening skin condition since the last visit, primarily concerned about acne and a possible allergic reaction to benzoyl peroxide. She reports that her skin erupted after using benzoyl peroxide, which she suspects caused an allergic reaction. She describes her acne as primarily located on her cheeks and chin, and notes that it was itchy even before using benzoyl peroxide.  She has been using clindamycin since May but is unsure of its effectiveness. She also mentions a history of chronic idiopathic urticaria, for which she is receiving Xolair injections. She reports no recent episodes of red spots on her hands or major flushing.  She is currently using First Aid Beauty Cleanser and CeraVe cream for her skincare routine. She restarted clindamycin last week after the skin eruption. She has not yet restarted azelaic acid. She is on [place], a generic form of Yaz, for birth control, and notes that her skin was better on Yaz. She has been prescribed spironolactone but has not taken it separately.      The following portions of the chart were reviewed this encounter and updated as appropriate: medications, allergies, medical history  Review of Systems:  No other skin or systemic complaints except as noted in HPI or Assessment and Plan.  Objective  Well appearing patient in no apparent distress; mood and affect are within normal limits.  A focused examination was performed of the following areas: Face  Relevant exam findings are noted in the Assessment and Plan.         Assessment & Plan   1. 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    - Plan: Discontinue benzoyl peroxide use immediately. Begin using Opzelura sample for two weeks on affected areas. Continue with First Aid Beauty Cleanser and CeraVe moisturizer regimen. Apply sunscreen every morning.  2. Acne Vulgaris    - Assessment: Ongoing management of acne vulgaris.    - Plan: After two weeks of Opzelura use, restart azelaic acid application in the morning. One week following azelaic acid use, reintroduce tretinoin at 0.025% strength, limiting use to two to three nights a week. On non-tretinoin nights, apply hyaluronic acid and CeraVe. Consider using Neutrogena acne patches for isolated pimples. Schedule a follow-up in two months to evaluate the potential addition of spironolactone to the treatment regimen.  3. Chronic Idiopathic Urticaria    - Assessment: Persistent idiopathic urticaria under treatment.    - Plan: Continue receiving Xolair injections as directed by the allergist. Monitor symptom improvement over the next three to six months.  4. Hormonal Acne    - Assessment: Treatment of hormonal acne in progress.    - Plan: Maintain current regimen of [name] birth control pills. Continue 50 mg daily dose of doxycycline, with an increase to 100 mg during flare-ups if necessary. Reassess the need for spironolactone at the two-month follow-up appointment.  5. Future Treatment Considerations    - Assessment: Planning for future acne treatment adjustments.    - Plan: Once the skin has stabilized and is tolerating tretinoin well, discuss the possibility of transitioning to Arazlo (Tazarotene)  for a potentially stronger yet gentler retinoid option, considering its unique formulation and delivery mechanism.    No follow-ups on file.   Documentation: I have reviewed the above documentation for accuracy and completeness, and I agree with the above.  Stasia Cavalier, am acting  as scribe for Langston Reusing, DO.  Langston Reusing, DO

## 2023-04-17 ENCOUNTER — Encounter: Payer: Self-pay | Admitting: Dermatology

## 2023-04-29 ENCOUNTER — Ambulatory Visit (HOSPITAL_BASED_OUTPATIENT_CLINIC_OR_DEPARTMENT_OTHER): Payer: Managed Care, Other (non HMO) | Admitting: Physical Therapy

## 2023-05-08 ENCOUNTER — Ambulatory Visit (INDEPENDENT_AMBULATORY_CARE_PROVIDER_SITE_OTHER): Payer: Managed Care, Other (non HMO) | Admitting: Orthopaedic Surgery

## 2023-05-08 ENCOUNTER — Encounter: Payer: Self-pay | Admitting: Orthopaedic Surgery

## 2023-05-08 DIAGNOSIS — M67442 Ganglion, left hand: Secondary | ICD-10-CM

## 2023-05-08 NOTE — Progress Notes (Signed)
Office Visit Note   Patient: Marie Hanson           Date of Birth: February 21, 1981           MRN: [id number] Visit Date: 05/08/2023              Requested by: Philip Aspen, Limmie Patricia, MD [address] [place],  [place] [address] PCP: Philip Aspen, Limmie Patricia, MD   Assessment & Plan: Visit Diagnoses:  1. 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  Disease process discussed and treatment options were explained.  We performed an aspiration of the ganglion cyst under local anesthetic.  The cyst was fully decompressed and was able to get a scant amount of cystic fluid.  Patient tolerated this well.  She has my card if she needs to get back in touch with [place] if this recurs and she chooses to move forward with surgery.  Follow-Up Instructions: No follow-ups on file.   Orders:  Orders Placed This Encounter  Procedures   Small Joint Inj   No orders of the defined types were placed in this encounter.     Procedures: Small Joint Inj: L index MCP on 05/08/2023 11:19 AM Details: volar approach    Clinical Data: No additional findings.   Subjective: Chief Complaint  Patient presents with   Left Hand - Pain    HPI patient is a very pleasant right-hand-dominant female who comes in today with concerns about her left hand.  She noticed a small pea-sized cyst to the base of the index finger about 2 weeks ago.  She has not noticed any change in size.  She denies any triggering of the finger.  She does have increased pain when she is driving or grasping essentially when she is applying any pressure to the area.  She states that she has been crocheting a lot recently.  Review of Systems as detailed in HPI.  All others reviewed and are negative.   Objective: Vital Signs: There were no vitals taken for this visit.  Physical Exam well-developed well-nourished female no  acute distress.  Alert and oriented x 3.  Ortho Exam left hand exam: There is a palpable and tender nodule just ulnar to the second metacarpal head.  No reproducible triggering.  No skin changes.  She is neurovascular intact distally.  Specialty Comments:  No specialty comments available.  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   Raynaud's phenomenon 06/18/2022   Rosacea 03/05/2019   Anxiety 03/05/2019   Past Medical History:  Diagnosis Date   Anxiety    Depression    Eczema    Nipple pain x's 2 months   Rosacea    Urticaria     Family History  Problem Relation Age of Onset   Urticaria Mother    Eczema Mother    Allergic rhinitis Mother    Diabetes Mother    Depression Father    Anxiety disorder Father    BRCA 1/2 Neg Hx    Breast cancer Neg Hx     Past Surgical History:  Procedure Laterality Date   AUGMENTATION MAMMAPLASTY  breast enhancement & removal   Social History   Occupational History   Not on file  Tobacco Use   Smoking status: Never    Passive exposure: Never   Smokeless tobacco: Never  Vaping Use   Vaping status: Never Used  Substance and Sexual Activity   Alcohol use: Not Currently   Drug use: Never   Sexual activity: Yes    Partners: Male    Birth control/protection: OCP

## 2023-05-11 ENCOUNTER — Encounter (HOSPITAL_BASED_OUTPATIENT_CLINIC_OR_DEPARTMENT_OTHER): Payer: Self-pay

## 2023-05-11 ENCOUNTER — Ambulatory Visit (HOSPITAL_BASED_OUTPATIENT_CLINIC_OR_DEPARTMENT_OTHER): Payer: Managed Care, Other (non HMO) | Admitting: Physical Therapy

## 2023-05-14 ENCOUNTER — Ambulatory Visit: Payer: Managed Care, Other (non HMO) | Admitting: *Deleted

## 2023-05-14 DIAGNOSIS — L501 Idiopathic urticaria: Secondary | ICD-10-CM | POA: Diagnosis not present

## 2023-05-20 ENCOUNTER — Encounter: Payer: Self-pay | Admitting: Internal Medicine

## 2023-05-20 ENCOUNTER — Other Ambulatory Visit (INDEPENDENT_AMBULATORY_CARE_PROVIDER_SITE_OTHER): Payer: Managed Care, Other (non HMO)

## 2023-05-20 ENCOUNTER — Ambulatory Visit (INDEPENDENT_AMBULATORY_CARE_PROVIDER_SITE_OTHER): Payer: Managed Care, Other (non HMO) | Admitting: Internal Medicine

## 2023-05-20 VITALS — BP 120/88 | HR 84 | Ht 62.5 in | Wt 108.0 lb

## 2023-05-20 DIAGNOSIS — R140 Abdominal distension (gaseous): Secondary | ICD-10-CM

## 2023-05-20 DIAGNOSIS — R232 Flushing: Secondary | ICD-10-CM

## 2023-05-20 DIAGNOSIS — L509 Urticaria, unspecified: Secondary | ICD-10-CM

## 2023-05-20 DIAGNOSIS — R1031 Right lower quadrant pain: Secondary | ICD-10-CM

## 2023-05-20 DIAGNOSIS — R197 Diarrhea, unspecified: Secondary | ICD-10-CM | POA: Diagnosis not present

## 2023-05-20 DIAGNOSIS — R1032 Left lower quadrant pain: Secondary | ICD-10-CM

## 2023-05-20 DIAGNOSIS — Z91018 Allergy to other foods: Secondary | ICD-10-CM

## 2023-05-20 LAB — FOLATE: Folate: 8.4 ng/mL (ref >5.9)

## 2023-05-20 LAB — IBC + FERRITIN
Ferritin: 44.5 ng/mL (ref 10.0–291.0)
Iron: 78 ug/dL (ref 42–145)
Saturation Ratios: 14.7 % — ABNORMAL LOW (ref 20.0–50.0)
TIBC: 529.2 ug/dL — ABNORMAL HIGH (ref 250.0–450.0)
Transferrin: 378 mg/dL — ABNORMAL HIGH (ref 212.0–360.0)

## 2023-05-20 LAB — VITAMIN B12: Vitamin B-12: 239 pg/mL (ref 211–911)

## 2023-05-20 NOTE — Progress Notes (Signed)
Chief Complaint: Food intolerance  HPI : 42 year old female with history of eczema, anxiety, and depression presents with food intolerance  Patient has been experiencing flushing, hives, and diarrhea since 08/2022.  Acne has been worse as well.  She was treated with allergy shots since her symptoms seem to worsen slightly.  She does find that Xolair seems to help with her symptoms.  Since she has been following with the low histamine diet for the last 4 months, she does think that her symptoms overall have improved.  On average she has 1 stool per day.  Denies any nocturnal stools.  Denies blood in the stools.  Endorses abdominal cramping in the lower abdomen that will just go away on its own with time.  Endorses nausea.  Denies vomiting.  Denies dysphagia or acid reflux.  She is on doxycycline for dry eyes which also seems to help with her acne as well.  She went to a holistic provider who performed a panel of labs that showed a negative GI pathogen panel, moderately elevated fecal calprotectin, and mildly low fecal elastase.  Patient denies any history of pancreatitis.  Wt Readings from Last 3 Encounters:  05/20/23 108 lb (49 kg)  03/18/23 112 lb 8 oz (51 kg)  03/08/23 110 lb 3.2 oz (50 kg)    Past Medical History:  Diagnosis Date   Anal fissure    Anxiety    Cardiac arrhythmia    during chidlhood   Depression    Eczema    Nipple pain x's 2 months   Rosacea    Urticaria      Past Surgical History:  Procedure Laterality Date   AUGMENTATION MAMMAPLASTY Bilateral    breast enhancement & removal   BREAST IMPLANT REMOVAL Bilateral    REFRACTIVE SURGERY Bilateral    Family History  Problem Relation Age of Onset   Urticaria Mother    Eczema Mother    Allergic rhinitis Mother    Diabetes Mother    Colon polyps Mother    Irritable bowel syndrome Mother    Hyperlipidemia Mother    Depression Father    Anxiety disorder Father    BRCA 1/2 Neg Hx    Breast cancer Neg Hx     Social History   Tobacco Use   Smoking status: Never    Passive exposure: Never   Smokeless tobacco: Never  Vaping Use   Vaping status: Never Used  Substance Use Topics   Alcohol use: Not Currently   Drug use: Never   Current Outpatient Medications  Medication Sig Dispense Refill   Azelaic Acid 15 % gel AFTER SKIN IS THOROUGHLY WASHED AND PATTED DRY, GENTLY AND THOROUGHLY APPLY A THIN FILM TO AFFECTED AREA IN THE MORNING AND AT NIGHT 50 g 2   clindamycin (CLINDAGEL) 1 % gel Apply topically 2 (two) times daily. 30 g 0   doxycycline (VIBRAMYCIN) 50 MG capsule Take 50 mg by mouth daily.     loratadine (CLARITIN) 10 MG tablet Take 10 mg by mouth daily.     metroNIDAZOLE (METROGEL) 0.75 % gel Apply 1 Application topically daily.     MIEBO 1.338 GM/ML SOLN Apply to eye.     montelukast (SINGULAIR) 10 MG tablet Take 1 tablet (10 mg total) by mouth at bedtime. 30 tablet 5   Multiple Vitamin (MULTIVITAMIN) tablet Take 1 tablet by mouth daily.     [name] 3-0.02 MG tablet TAKE 1 TABLET BY MOUTH EVERY DAY 84 tablet 1  RESTASIS 0.05 % ophthalmic emulsion      spironolactone (ALDACTONE) 25 MG tablet TAKE 1 TABLET (25 MG TOTAL) BY MOUTH DAILY. 90 tablet 0   tretinoin (RETIN-A) 0.025 % cream Apply 1 Application topically at bedtime.     XOLAIR 150 MG/ML prefilled syringe Inject 300 mg into the skin every 28 (twenty-eight) days.     Adapalene 0.3 % gel Apply 1 Application topically at bedtime.     cromolyn (GASTROCROM) 100 MG/5ML solution Take 10 mLs (200 mg total) by mouth 4 (four) times daily -  before meals and at bedtime. (Patient not taking: Reported on 05/20/2023) 480 mL 3   EPINEPHrine 0.3 mg/0.3 mL IJ SOAJ injection SMARTSIG:0.3 Milligram(s) IM PRN (Patient not taking: Reported on 05/20/2023)     fexofenadine (ALLEGRA) 180 MG tablet Take 180 mg by mouth daily. (Patient not taking: Reported on 05/20/2023)     Current Facility-Administered Medications  Medication Dose Route Frequency Provider  Last Rate Last Admin   omalizumab Geoffry Paradise) prefilled syringe 300 mg  300 mg Subcutaneous Q28 days Verlee Monte, MD   300 mg at 05/14/23 1025   Allergies  Allergen Reactions   Benzoyl Peroxide Dermatitis   Iodine      Review of Systems: All systems reviewed and negative except where noted in HPI.   Physical Exam: BP 120/88 (BP Location: Left Arm, Patient Position: Sitting, Cuff Size: Normal)   Pulse 84   Ht 5' 2.5" (1.588 m) Comment: heigth measured without shoes  Wt 108 lb (49 kg)   LMP 04/29/2023   BMI 19.44 kg/m  Constitutional: Pleasant,well-developed, female in no acute distress. HEENT: Normocephalic and atraumatic. Conjunctivae are normal. No scleral icterus. Cardiovascular: Normal rate, regular rhythm.  Pulmonary/chest: Effort normal and breath sounds normal. No wheezing, rales or rhonchi. Abdominal: Soft, nondistended, nontender. Bowel sounds active throughout. There are no masses palpable. No hepatomegaly. Extremities: No edema Neurological: Alert and oriented to person place and time. Skin: Skin is warm and dry. No rashes noted. Psychiatric: Normal mood and affect. Behavior is normal.  Labs 02/2023: CBC normal.  CMP normal.  Alpha galactosidase activity is nml.  TTG IgA negative.  IgA normal.  CRP negative.  ESR normal.  ANA negative.  TSH normal. Serum tryptase was normal (though per patient this was not performed while the patient was symptomatic).   ASSESSMENT AND PLAN: Diarrhea Bloating Lower abdominal pain Flushing Hives Patient presents with diarrhea, bloating, and lower abdominal pain that has been present along with hives, flushing, and acne since 08/2022. Patient is currently on a low histamine diet and has seen some benefit in her symptoms on this diet as well as Xolair (anti-IgE). Some of her diarrhea symptoms could potentially be related to use of doxycycline to help with dry eyes and acne. Will check for additional conditions such as abdominal angioedema  (C4, C1 esterase), porphyria (urine test for porphobilinogen), and carcinoid (plasma 5-HIAA). Patient had a holistic panel that showed moderately elevated fecal calprotectin and mildly low fecal elastase so will check these labs. Will also see if the patient may have SIBO as a source of her diarrhea and bloating.  Patient did decline an antispasmodic today because she states that her ab pain symptoms are not currently that severe. Will also have her try IBGard.  - Check C4, C1 esterase, serum 5-HIAA - Check urine porphobilinogen - Check fecal calprotectin, fecal elastase - SIBO breath test - Gave IBGard samples - Declined Bentyl prescription - RTC in 3 months  Eulah Pont, MD  I spent 62 minutes of time, including in depth chart review, independent review of results as outlined above, communicating results with the patient directly, face-to-face time with the patient, coordinating care, ordering studies and medications as appropriate, and documentation.

## 2023-05-20 NOTE — Patient Instructions (Addendum)
Your provider has requested that you go to the basement level for lab work before leaving today. Press "B" on the elevator. The lab is located at the first door on the left as you exit the elevator.  You have been given a testing kit to check for small intestine bacterial overgrowth (SIBO) which is completed by a company named [facility]. Make sure to return your test in the mail using the return mailing label given to you along with the kit. The test order, your demographic and insurance information have all already been sent to the company. [facility] will collect an upfront charge of $99.74 for commercial insurance plans and $209.74 if you are paying cash. Make sure to discuss with [facility] PRIOR to having the test to see if they have gotten information from your insurance company as to how much your testing will cost out of pocket, if any. Please contact [facility] at phone number [phone] to get instructions regarding how to perform the test as our office is unable to give specific testing instructions.  You are scheduled for a follow up visit on 08/23/23 at 1:30 pm  If your blood pressure at your visit was 140/90 or greater, please contact your primary care physician to follow up on this.  _______________________________________________________  If you are age 10 or older, your body mass index should be between 23-30. Your Body mass index is 19.44 kg/m. If this is out of the aforementioned range listed, please consider follow up with your Primary Care Provider.  If you are age 75 or younger, your body mass index should be between 19-25. Your Body mass index is 19.44 kg/m. If this is out of the aformentioned range listed, please consider follow up with your Primary Care Provider.   ________________________________________________________  The [facility] providers would like to encourage you to use [facility] to communicate with providers for non-urgent requests or  questions.  Due to long hold times on the telephone, sending your provider a message by [facility] may be a faster and more efficient way to get a response.  Please allow 48 business hours for a response.  Please remember that this is for non-urgent requests.  _______________________________________________________  Due to recent changes in healthcare laws, you may see the results of your imaging and laboratory studies on MyChart before your provider has had a chance to review them.  We understand that in some cases there may be results that are confusing or concerning to you. Not all laboratory results come back in the same time frame and the provider may be waiting for multiple results in order to interpret others.  Please give [place] 48 hours in order for your provider to thoroughly review all the results before contacting the office for clarification of your results.    Thank you for entrusting me with your care and for choosing [facility], Dr. Eulah Pont

## 2023-05-21 LAB — C4 COMPLEMENT: C4 Complement: 24 mg/dL (ref 15–57)

## 2023-05-22 ENCOUNTER — Ambulatory Visit: Payer: Managed Care, Other (non HMO)

## 2023-05-22 DIAGNOSIS — L509 Urticaria, unspecified: Secondary | ICD-10-CM

## 2023-05-22 DIAGNOSIS — R1031 Right lower quadrant pain: Secondary | ICD-10-CM

## 2023-05-22 DIAGNOSIS — R140 Abdominal distension (gaseous): Secondary | ICD-10-CM

## 2023-05-22 DIAGNOSIS — R197 Diarrhea, unspecified: Secondary | ICD-10-CM

## 2023-05-22 DIAGNOSIS — R232 Flushing: Secondary | ICD-10-CM

## 2023-05-25 LAB — CALPROTECTIN, FECAL: Calprotectin, Fecal: 19 ug/g (ref 0–120)

## 2023-05-28 LAB — SPECIMEN STATUS REPORT

## 2023-05-29 ENCOUNTER — Encounter: Payer: Self-pay | Admitting: Internal Medicine

## 2023-05-29 ENCOUNTER — Telehealth: Payer: Self-pay

## 2023-05-29 ENCOUNTER — Other Ambulatory Visit: Payer: Self-pay

## 2023-05-29 DIAGNOSIS — F419 Anxiety disorder, unspecified: Secondary | ICD-10-CM

## 2023-05-29 DIAGNOSIS — L509 Urticaria, unspecified: Secondary | ICD-10-CM

## 2023-05-29 DIAGNOSIS — R1084 Generalized abdominal pain: Secondary | ICD-10-CM

## 2023-05-29 DIAGNOSIS — R232 Flushing: Secondary | ICD-10-CM

## 2023-05-29 LAB — PORPHOBILINOGEN, RANDOM URINE

## 2023-05-29 LAB — C1 ESTERASE INHIBITOR: C1INH SerPl-mCnc: 26 mg/dL (ref 21–39)

## 2023-05-29 NOTE — Telephone Encounter (Signed)
-----   Message from [facility] Tisha C sent at 05/29/2023 10:25 AM EDT ----- Regarding: FW: Results  ----- Message ----- From: Imogene Burn, MD Sent: 05/27/2023   2:53 PM EDT To: Dennison Mascot, CMA Subject: RE: Results                                    Still waiting on a few lab tests to come back. Right now her lab results show that she has some iron deficiency. She can try to eat more iron-containing foods and please confirm that she already takes a multivitamin that contains iron. I will let her know as soon all of her labs come back! ----- Message ----- From: Dennison Mascot, CMA Sent: 05/27/2023   2:33 PM EDT To: Imogene Burn, MD Subject: Results                                        Roxy Manns, Spoke to patient she wanted to know the status on her lab results

## 2023-05-29 NOTE — Telephone Encounter (Signed)
Patient notified.  Also confirmed with her that waiting 2 to 4 weeks after finishing Doxycycline is necessary before collecting samples for the SIBO breath test. Encouraged her to wait the full 4 weeks. The specimen for the random urine porphobilinogen test will need to be recollected. The patient agrees to return to the lab this week for that purpose.

## 2023-05-31 LAB — PANCREATIC ELASTASE, FECAL: Pancreatic Elastase-1, Stool: 303 ug/g

## 2023-06-02 NOTE — Progress Notes (Unsigned)
FOLLOW UP Date of Service/Encounter:  06/03/23  Subjective:  Marie Hanson (DOB: 1980-10-26) is a 42 y.o. female who [facility] in re-evaluation of the following: seasonal and perennial allergic rhinitis, chronic urticaria, rashes, concern for allergic reaction  History obtained from: chart review and patient.  For Review, LV was on 03/18/23  with Dr.Cam Hanson seen for routine follow-up. See below for summary of history and diagnostics.   Therapeutic plans/changes recommended: reported new symptoms of upset stomach after eating allergy type cookies, rotisserie chicken, stopped singualir due to feeling mood being low. Interested in [place] given in clinic.  "Discussed that her reactions are less consistent with food allergic reactions and more consistent with chronic nonallergic urticaria. Her recent episodes of extreme nausea could be a sign of MCAS though her baseline tryptase was normal. We do not have one during an attack to look at rate of rise. " ----------------------------------------------------- Pertinent History/Diagnostics:  Allergic Rhinitis:  Congestion and pain, worse on right. + itchy eyes with occasional drainage. Recent ENT visit with normal rhinoscopy. Moved to [place] 3.5 years prior. Symptoms worse in spring and fall.  Horse has been a trigger.  Has history of nasal cauterization. RUSH AIT on 11/13/22. Last injection 12/24/22-stopped due to acne which occurred near time of injections as well as localized reactions despite epi wash and extra antihistamine. - SPT environmental panel (10/24/22): positive to grass pollen, weed pollen, tree pollen, indoor/outdoor molds, dog, horse, tobacco leaf -intradermal testing positive to cat and ragweed Eczema: occasionally, flares last year around her nipple. Normal mammogram. She did see a dermatologist and was given a non-steroid cream that didn't work. Eucerin urea cream did work.  She has also had a spot on her left eyelid, but none in over a year.  Chronic urticaria: Has random episodes of urticaria, unclear triggers.  - Food allergy SPT 10/24/22-most common foods negative, obtained for patient reassurance. - Extensive food allergy testing performed via bloodwork 01/30/23 per patient request: postive to hazelnut only (1.25, cor A8 2.02, A1 0.49), negative or below positive threshold (< 0.35) to peanuts, other tree nuts, sunflower seeds, wheat, egg white, oat, banana, rice, barley, chocolate, milk, flaxseed, apple, buckwheat - Patient returned 02/12/23, evaluated by Marie Settle, Marie Hanson and additional lab work requested and completed: normal baseline tryptase 3.0, low level positive to grape (0.37), negative or below positive threshold of 0.35 to tomato, white potato, sweet potato, avocado, corn, yeast, green pea, coconut, broccoli, black bean - FU on 02/26/23: CBCd, CMP and thyroid studies normal - Extensive counseling on CIU and non-allergic nature provided. Patient now on allegra 180 mg BID, pepcid 10 mg BID, singulair nightly. Xolair has been offered. - patient planning on second opinion for hives at [facility] allergy. - evaluated by Derm 03/12/23: acne: well controlled, discontinued meds; petechia on forehead with normal cuticle exam. Plan: avoid triggering foods, continue oral AH, autoimmune labs ordered.  - Xolair started 03/18/23 - 05/20/23: - Check C4, C1 esterase, serum 5-HIAA-reviewed personally and normal (obtained by GI), normal fecal calprotectin and pancreatic elastase - 06/03/23: reported joint pain with xoliar (temporary stop, awaiting Rheum eval). Reported GI symptoms improved off doxycycline for acne.  Still having facial flushing.  Concern for food allergy:  Hives when eating sushi - normally orders [place] roll or similar Hives from veggie burger with red bell pepper Salmon-nausea - serum IgE sesame negative, paprika/green pepper negative, cod, halibut,  pike, tuna, salmon, mackerel, trout negative - negative tTG  01/21/22 Other: acne-follows with dermatology --------------------------------------------------- Today presents for follow-up. Discussed the use of AI scribe software for clinical note transcription with the patient, who gave verbal consent to proceed.  History of Present Illness   The patient, currently on Xolair for chronic urticaria, has been experiencing significant joint pain, described as "clicking and popping," leading to a feeling of advanced age. She is considering pausing the Xolair treatment due to uncertainty about its effectiveness and potential side effects.  In addition to this, the patient has been dealing with persistent itching and flushing from the neck up. Despite increasing the dosage of Allegra to two in the morning, she still requires Benadryl at night for symptom control. The patient has expressed interest in trying hydroxyzine for symptom management.  The patient also reports that the flushing symptoms typically occur after meals, up to three to four times a day. However, she denies any gastrointestinal issues. She recently discontinued doxycycline, which she had been taking for acne, due to concerns about its impact on gut bacteria.  The patient is currently following a low histamine diet and has introduced some new vegetables into her diet under the guidance of a functional medicine doctor. She is also taking non-prescription supplements, which she believes are helping.  The patient has a history of acne, but this has not been an issue for the past week and a half.      Chart Review: GI visit 05/20/23: "Patient presents with diarrhea, bloating, and lower abdominal pain that has been present along with hives, flushing, and acne since 08/2022. Patient is currently on a low histamine diet and has seen some benefit in her symptoms on this diet as well as Xolair (anti-IgE). Some of her diarrhea symptoms could  potentially be related to use of doxycycline to help with dry eyes and acne. Will check for additional conditions such as abdominal angioedema (C4, C1 esterase), porphyria (urine test for porphobilinogen), and carcinoid (plasma 5-HIAA). Patient had a holistic panel that showed moderately elevated fecal calprotectin and mildly low fecal elastase so will check these labs. Will also see if the patient may have SIBO as a source of her diarrhea and bloating.  Patient did decline an antispasmodic today because she states that her ab pain symptoms are not currently that severe. Will also have her try IBGard.  - Check C4, C1 esterase, serum 5-HIAA-reviewed personally and normal - Check urine porphobilinogen - Check fecal calprotectin, fecal elastase - SIBO breath test - Gave IBGard samples - Declined Bentyl prescription - RTC in 3 months"  All medications reviewed by clinical staff and updated in chart. No new pertinent medical or surgical history except as noted in HPI.  ROS: All others negative except as noted per HPI.   Objective:  BP 118/80 (BP Location: Right Arm, Patient Position: Sitting, Cuff Size: Normal)   Pulse 64   Temp 97.7 F (36.5 C) (Temporal)   Resp 16   LMP 04/29/2023   SpO2 100%  There is no height or weight on file to calculate BMI. Physical Exam: General Appearance:  Alert, cooperative, no distress, appears stated age  Head:  Normocephalic, without obvious abnormality, atraumatic  Eyes:  Conjunctiva clear, EOM's intact  Ears EACs normal bilaterally and normal TMs bilaterally  Nose: Nares normal, hypertrophic turbinates, normal mucosa, and no visible anterior polyps  Throat: Lips, tongue normal; teeth and gums normal, normal posterior oropharynx  Neck: Supple, symmetrical  Lungs:   clear to auscultation bilaterally, Respirations unlabored, no coughing  Heart:  regular rate and rhythm and no murmur, Appears well perfused  Extremities: No edema  Skin: Skin color, texture,  turgor normal and no rashes or lesions on visualized portions of skin  Neurologic: No gross deficits   Labs:  Lab Orders         Other/Misc lab test         Prostaglandin D2, serum         Other/Misc lab test         Leukotriene E4, 24 hr, U      Assessment/Plan   Chronic Urticaria Patient reports joint pain possibly related to Xolair. Uncertain efficacy of Xolair for urticaria control. Currently on Allegra twice daily and Benadryl at night. -Discontinue Xolair temporarily to assess for resolution of joint pain and evaluate its impact on urticaria control. -Consider trial of Hydroxyzine for nighttime itching and flushing, with caution regarding potential cognitive effects. 25 mg nightly as needed - continue allegra 180 mg twice daily  Flushing Occurs postprandially, primarily affecting the head and neck. No associated gastrointestinal symptoms. Patient has been on a low histamine diet and has stopped Doxycycline. -Add baby aspirin 81 mg daily to potentially reduce prostaglandin-mediated flushing. Can increase to twice daily if needed.  -Complete workup for Mast Cell Activation Syndrome (MCAS) despite normal tryptase level. Order urine tests for MCAS and blood test for C-kit mutation and prostaglandins. Must be off aspirin during this testing as it may affect results. -Refer to Rheumatology for further evaluation of joint pain   Acne No current issues reported. -Continue current management with dermatology.  Allergic rhinitis and conjunctivitis: Allergen avoidance -Continue Allegra as above  Follow up : for urine and serum studies-must be off aspirin for testing 4 months for regular follow-up It was a pleasure seeing you again in clinic today! Thank you for allowing me to participate in your care.  Tonny Bollman, MD [facility]   Other: none  Tonny Bollman, MD  [facility]

## 2023-06-03 ENCOUNTER — Other Ambulatory Visit: Payer: Managed Care, Other (non HMO)

## 2023-06-03 ENCOUNTER — Ambulatory Visit (INDEPENDENT_AMBULATORY_CARE_PROVIDER_SITE_OTHER): Payer: Managed Care, Other (non HMO) | Admitting: Internal Medicine

## 2023-06-03 ENCOUNTER — Encounter: Payer: Self-pay | Admitting: Internal Medicine

## 2023-06-03 ENCOUNTER — Other Ambulatory Visit: Payer: Self-pay

## 2023-06-03 VITALS — BP 118/80 | HR 64 | Temp 97.7°F | Resp 16

## 2023-06-03 DIAGNOSIS — R1084 Generalized abdominal pain: Secondary | ICD-10-CM

## 2023-06-03 DIAGNOSIS — R232 Flushing: Secondary | ICD-10-CM | POA: Insufficient documentation

## 2023-06-03 DIAGNOSIS — F419 Anxiety disorder, unspecified: Secondary | ICD-10-CM

## 2023-06-03 DIAGNOSIS — J302 Other seasonal allergic rhinitis: Secondary | ICD-10-CM

## 2023-06-03 DIAGNOSIS — L508 Other urticaria: Secondary | ICD-10-CM | POA: Diagnosis not present

## 2023-06-03 DIAGNOSIS — J3089 Other allergic rhinitis: Secondary | ICD-10-CM | POA: Diagnosis not present

## 2023-06-03 DIAGNOSIS — H1013 Acute atopic conjunctivitis, bilateral: Secondary | ICD-10-CM

## 2023-06-03 DIAGNOSIS — L700 Acne vulgaris: Secondary | ICD-10-CM

## 2023-06-03 LAB — 5-HIAA, PLASMA: 5-HIAA, Plasma: 6.6 ng/mL

## 2023-06-03 MED ORDER — HYDROXYZINE HCL 25 MG PO TABS: 25.0000 mg | ORAL_TABLET | Freq: Every evening | ORAL | 3 refills | Status: DC | PRN

## 2023-06-03 NOTE — Patient Instructions (Addendum)
Chronic Urticaria Patient reports joint pain possibly related to Xolair. Uncertain efficacy of Xolair for urticaria control. Currently on Allegra twice daily and Benadryl at night. -Discontinue Xolair temporarily to assess for resolution of joint pain and evaluate its impact on urticaria control. -Consider trial of Hydroxyzine for nighttime itching and flushing, with caution regarding potential cognitive effects. 25 mg nightly as needed - continue allegra 180 mg twice daily  Flushing Occurs postprandially, primarily affecting the head and neck. No associated gastrointestinal symptoms. Patient has been on a low histamine diet and has stopped Doxycycline. -Add baby aspirin 81 mg daily to potentially reduce prostaglandin-mediated flushing. Can increase to twice daily if needed.  -Complete workup for Mast Cell Activation Syndrome (MCAS) despite normal tryptase level. Order urine tests for MCAS and blood test for C-kit mutation and prostaglandins. Must be off aspirin during this testing as it may affect results. -Refer to Rheumatology for further evaluation of joint pain  Acne No current issues reported. -Continue current management with dermatology.  Allergic rhinitis and conjunctivitis: Allergen avoidance -Continue Allegra as above  Follow up : for urine studies-must be off aspirin for testing It was a pleasure seeing you again in clinic today! Thank you for allowing me to participate in your care.  Tonny Bollman, MD [facility]

## 2023-06-05 ENCOUNTER — Encounter: Payer: Self-pay | Admitting: Internal Medicine

## 2023-06-05 LAB — PORPHOBILINOGEN, RANDOM URINE: Porphobilinogen, Rand Ur: 0.2 mg/L (ref 0.0–2.0)

## 2023-06-06 MED ORDER — DROSPIRENONE-ETHINYL ESTRADIOL 3-0.03 MG PO TABS: 1.0000 | ORAL_TABLET | Freq: Every day | ORAL | 1 refills | Status: DC

## 2023-06-06 MED ORDER — DROSPIRENONE-ETHINYL ESTRADIOL 3-0.02 MG PO TABS: 1.0000 | ORAL_TABLET | Freq: Every day | ORAL | 11 refills | Status: DC

## 2023-06-06 NOTE — Addendum Note (Signed)
Addended by: Kern Reap B on: 06/06/2023 02:31 PM   Modules accepted: Orders

## 2023-06-10 MED ORDER — DROSPIRENONE-ETHINYL ESTRADIOL 3-0.02 MG PO TABS: 1.0000 | ORAL_TABLET | Freq: Every day | ORAL | 1 refills | Status: DC

## 2023-06-10 NOTE — Addendum Note (Signed)
Addended by: Kern Reap B on: 06/10/2023 07:17 AM   Modules accepted: Orders

## 2023-06-11 ENCOUNTER — Ambulatory Visit: Payer: Managed Care, Other (non HMO)

## 2023-06-12 NOTE — Progress Notes (Signed)
Office Visit Note  Patient: Marie Hanson             Date of Birth: October 31, 1980           MRN: [id number]             PCP: Philip Aspen, Limmie Patricia, MD Referring: Philip Aspen, Almira Bar* Visit Date: 06/26/2023 Occupation: @GUAROCC @  Subjective:  Pain in multiple joints and Raynaud's  History of Present Illness: Marie Hanson is a 42 y.o. female seen in consultation per request of her PCP.  Patient states in December 2023 she developed COVID-19 virus infection.  She was treated with Paxlovid and had a rebound COVID.  She did not develop any other complications from COVID-19 virus infection.  She had been on allergy shots for environmental allergies for a long time.  She states after that she had to stop taking allergy shots as her skin used to breakout.  In May 2024 she started developing food allergies with flushing, runny nose and rashes.  She was placed on low histamine diet which helped.  She states that because of being on a special diet she has lost weight.  She wants to know if there is any autoimmune reason for her to have food allergies.  She gives history of Raynaud's phenomenon for the last 2 years.  She denies any history of digital ulcers.  She gives history of discomfort in her neck, shoulder, right arm and wrist which she relates to doing crochet.  She also experiencing popping sensation in her right knee joint.  She has intermittent discomfort in her right hip and right ankle.  She states her right great toe hurts at times.  She has not noticed any joint swelling.  She used to do high impact aerobics which she stopped doing.  She lifts weights and also practices yoga.  She has had problems with plantar fasciitis and Achilles tendinitis in the past which improved after using orthotics.  There is no history of oral ulcers, nasal ulcers, malar rash, lymphadenopathy or inflammatory arthritis.  She gives history of dry eyes which she relates history of LASEK surgery.   There is no family history of autoimmune disease.  She is gravida 0.  There is no history of DVTs.    Activities of Daily Living:  Patient reports morning stiffness for 1-2 hours.   Patient Denies nocturnal pain.  Difficulty dressing/grooming: Denies Difficulty climbing stairs: Denies Difficulty getting out of chair: Denies Difficulty using hands for taps, buttons, cutlery, and/or writing: Denies  Review of Systems  Constitutional:  Negative for fatigue.  HENT:  Negative for mouth sores and mouth dryness.   Eyes:  Positive for itching and dryness. Negative for photophobia and visual disturbance.  Respiratory:  Negative for shortness of breath.   Cardiovascular:  Negative for chest pain and palpitations.  Gastrointestinal:  Negative for blood in stool, constipation and diarrhea.  Endocrine: Negative for increased urination.  Genitourinary:  Negative for involuntary urination.  Musculoskeletal:  Positive for joint pain, joint pain, myalgias, muscle weakness, morning stiffness, muscle tenderness and myalgias. Negative for gait problem and joint swelling.  Skin:  Positive for color change and sensitivity to sunlight. Negative for rash and hair loss.  Allergic/Immunologic: Negative for susceptible to infections.  Neurological:  Positive for numbness. Negative for dizziness and headaches.  Hematological:  Negative for swollen glands.  Psychiatric/Behavioral:  Negative for depressed mood and sleep disturbance. The patient is not nervous/anxious.     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   Raynaud's phenomenon 06/18/2022   Rosacea 03/05/2019   Anxiety 03/05/2019    Past Medical History:  Diagnosis Date   Anal  fissure    Anxiety    Cardiac arrhythmia    during chidlhood   Depression    Eczema    Nipple pain x's 2 months   Rosacea    Urticaria     Family History  Problem Relation Age of Onset   Urticaria Mother    Eczema Mother    Allergic rhinitis Mother    Diabetes Mother    Colon polyps Mother    Irritable bowel syndrome Mother    Hyperlipidemia Mother    Depression Father    Anxiety disorder Father    BRCA 1/2 Neg Hx    Breast cancer Neg Hx    Past Surgical History:  Procedure Laterality Date   AUGMENTATION MAMMAPLASTY Bilateral    breast enhancement & removal   BREAST IMPLANT REMOVAL Bilateral    REFRACTIVE SURGERY Bilateral    Social History   Social History Narrative   Not on file   Immunization History  Administered Date(s) Administered   Influenza, High Dose Seasonal PF 03/31/2022   Influenza, Seasonal, Injecte, Preservative Fre 05/11/2023   Influenza,inj,Quad PF,6+ Mos 03/28/2019   Influenza-Unspecified 04/03/2019, 04/20/2020, 04/27/2021   PFIZER(Purple Top)SARS-COV-2 Vaccination 11/12/2019, 12/07/2019, 07/01/2020   Tdap 03/05/2019     Objective: Vital Signs: BP 119/74 (BP Location: Right Arm, Patient Position: Sitting, Cuff Size: Normal)   Pulse 64   Resp 14   Ht 5\' 3"  (1.6 m)   Wt 107 lb 9.6 oz (48.8 kg)   LMP 06/07/2023   BMI 19.06 kg/m    Physical Exam Vitals and nursing note reviewed.  Constitutional:      Appearance: She is well-developed.  HENT:     Head: Normocephalic and atraumatic.  Eyes:     Conjunctiva/sclera: Conjunctivae normal.  Cardiovascular:     Rate and Rhythm: Normal rate and regular rhythm.     Heart sounds: Normal heart sounds.  Pulmonary:     Effort: Pulmonary effort is normal.     Breath sounds: Normal breath sounds.  Abdominal:     General: Bowel sounds are normal.     Palpations: Abdomen is soft.  Musculoskeletal:     Cervical back: Normal range of motion.  Lymphadenopathy:     Cervical: No cervical adenopathy.   Skin:    General: Skin is warm and dry.     Capillary Refill: Capillary refill takes 2 to 3 seconds.     Comments: No sclerodactyly or telangiectasia were noted.  No nailbed capillary changes were noted.  She had decreased capillary refill.  Neurological:     Mental Status: She is alert and oriented to person, place, and time.  Psychiatric:        Behavior: Behavior normal.      Musculoskeletal Exam: Cervical, thoracic and lumbar spine were in  good range of motion.  Shoulder joints, elbow joints, wrist joints, MCPs PIPs and DIPs with good range of motion with no synovitis.  Hip joints, knee joints, ankles, MTPs and PIPs with good range of motion with no synovitis.  She has some hypermobility  in her joints.  She was able to reach the floor with the palm of her hands.  There were no tender points.  CDAI Exam: CDAI Score: -- Patient Global: --; Provider Global: -- Swollen: --; Tender: -- Joint Exam 06/26/2023   No joint exam has been documented for this visit   There is currently no information documented on the homunculus. Go to the Rheumatology activity and complete the homunculus joint exam.  Investigation: No additional findings.  Imaging: No results found.  Recent Labs: Lab Results  Component Value Date   WBC 5.3 06/18/2023   HGB 14.7 06/18/2023   PLT 202.0 06/18/2023   NA 136 06/18/2023   K 4.1 06/18/2023   CL 101 06/18/2023   CO2 28 06/18/2023   GLUCOSE 105 (H) 06/18/2023   BUN 21 06/18/2023   CREATININE 0.96 06/18/2023   BILITOT 0.6 06/18/2023   ALKPHOS 49 06/18/2023   AST 23 06/18/2023   ALT 40 (H) 06/18/2023   PROT 7.4 06/18/2023   ALBUMIN 4.4 06/18/2023   CALCIUM 9.5 06/18/2023     Speciality Comments: No specialty comments available.  Procedures:  No procedures performed Allergies: Benzoyl peroxide and Iodine   Assessment / Plan:     Visit Diagnoses: Polyarthralgia-patient gives history of pain in multiple joints including her neck, right  shoulder, right arm and right wrist.  Which she relates to doing crochet.  She also complains of some discomfort in her right hip, right knee, right ankle and right great toe.  She states she has popping sensation.  She has not noticed any joint swelling.  She had problems with plantar fasciitis and Achilles tendinitis in the past which got better after using orthotics.  Some hypermobility was noted in the joints which could be contributing to polyarthralgia.  Isometric exercises were advised.  I advised her to contact me if she develops any joint swelling.  Raynaud's phenomenon without gangrene-she gives history of Raynaud's phenomenon for last 2 years.  She had decreased capillary refill.  No nailbed capillary changes were noted.  No sclerodactyly or digital ulcers were noted.  No telangiectasia were noted.  A handout on Raynauds was provided.  Detailed counseling on raynaud's phenominon was provided.  I also discussed the option of amlodipine during the winter months if needed.  Increased association of Raynaud's with autoimmune disease was discussed.  She had extensive labs by her PCP and all the autoimmune labs were negative.March 12, 2023 anticardiolipin IgG and IgM negative, beta-2 GP 1 IgM and IgG negative, SSA negative, SSB negative, ANA negative, RF negative, TTG negative, CRP<1, ESR 5  No further workup is needed at this point.  I advised her to contact us if she develops any new symptoms.  I will recheck labs in 1 year.  Rosacea-she has been using MetroGel.  No rash was noted on the examination today.  Seasonal and perennial allergic rhinitis-according to the patient the symptoms started after she moved to [place] from [place].  She was taking allergy shots in the past.  Other medical problems are listed as follows:  Anxiety  Nickel allergy  Chronic urticaria-she has developed food allergies.  She has noticed improvement since she has been on low histamine diet.  Acne  vulgaris  Orders: Orders Placed This Encounter  Procedures   CBC with Differential/Platelet   COMPLETE METABOLIC PANEL WITH GFR   Sedimentation rate   C3 and C4   Anti-DNA antibody, double-stranded   ANA   RNP Antibody   Anti-Smith  antibody   Sjogrens syndrome-A extractable nuclear antibody   Anti-scleroderma antibody   No orders of the defined types were placed in this encounter.    Follow-Up Instructions: Return in about 1 year (around 06/25/2024).   Pollyann Savoy, MD  Note - This record has been created using Animal nutritionist.  Chart creation errors have been sought, but may not always  have been located. Such creation errors do not reflect on  the standard of medical care.

## 2023-06-17 ENCOUNTER — Ambulatory Visit: Payer: Managed Care, Other (non HMO) | Admitting: Internal Medicine

## 2023-06-17 ENCOUNTER — Other Ambulatory Visit: Payer: Self-pay

## 2023-06-17 DIAGNOSIS — L508 Other urticaria: Secondary | ICD-10-CM

## 2023-06-17 DIAGNOSIS — R232 Flushing: Secondary | ICD-10-CM

## 2023-06-17 NOTE — Addendum Note (Signed)
Addended by: Florence Canner on: 06/17/2023 11:51 AM   Modules accepted: Orders

## 2023-06-18 ENCOUNTER — Encounter: Payer: Self-pay | Admitting: Internal Medicine

## 2023-06-18 ENCOUNTER — Ambulatory Visit (INDEPENDENT_AMBULATORY_CARE_PROVIDER_SITE_OTHER): Payer: Managed Care, Other (non HMO) | Admitting: Internal Medicine

## 2023-06-18 VITALS — BP 98/64 | HR 61 | Temp 97.6°F | Ht 64.0 in | Wt 108.4 lb

## 2023-06-18 DIAGNOSIS — Z1159 Encounter for screening for other viral diseases: Secondary | ICD-10-CM | POA: Diagnosis not present

## 2023-06-18 DIAGNOSIS — L700 Acne vulgaris: Secondary | ICD-10-CM

## 2023-06-18 DIAGNOSIS — Z1231 Encounter for screening mammogram for malignant neoplasm of breast: Secondary | ICD-10-CM

## 2023-06-18 DIAGNOSIS — Z0000 Encounter for general adult medical examination without abnormal findings: Secondary | ICD-10-CM | POA: Diagnosis not present

## 2023-06-18 LAB — LIPID PANEL
Cholesterol: 170 mg/dL (ref 0–200)
HDL: 98 mg/dL (ref >39.00)
LDL Cholesterol: 60 mg/dL (ref 0–99)
NonHDL: 72.36
Total CHOL/HDL Ratio: 2
Triglycerides: 64 mg/dL (ref 0.0–149.0)
VLDL: 12.8 mg/dL (ref 0.0–40.0)

## 2023-06-18 LAB — CBC WITH DIFFERENTIAL/PLATELET
Basophils Absolute: 0 10*3/uL (ref 0.0–0.1)
Basophils Relative: 0.8 % (ref 0.0–3.0)
Eosinophils Absolute: 0 10*3/uL (ref 0.0–0.7)
Eosinophils Relative: 0.5 % (ref 0.0–5.0)
HCT: 45.4 % (ref 36.0–46.0)
Hemoglobin: 14.7 g/dL (ref 12.0–15.0)
Lymphocytes Relative: 16.7 % (ref 12.0–46.0)
Lymphs Abs: 0.9 10*3/uL (ref 0.7–4.0)
MCHC: 32.5 g/dL (ref 30.0–36.0)
MCV: 94.1 fL (ref 78.0–100.0)
Monocytes Absolute: 0.2 10*3/uL (ref 0.1–1.0)
Monocytes Relative: 4 % (ref 3.0–12.0)
Neutro Abs: 4.1 10*3/uL (ref 1.4–7.7)
Neutrophils Relative %: 78 % — ABNORMAL HIGH (ref 43.0–77.0)
Platelets: 202 10*3/uL (ref 150.0–400.0)
RBC: 4.82 Mil/uL (ref 3.87–5.11)
RDW: 13.5 % (ref 11.5–15.5)
WBC: 5.3 10*3/uL (ref 4.0–10.5)

## 2023-06-18 LAB — COMPREHENSIVE METABOLIC PANEL
ALT: 40 U/L — ABNORMAL HIGH (ref 0–35)
AST: 23 U/L (ref 0–37)
Albumin: 4.4 g/dL (ref 3.5–5.2)
Alkaline Phosphatase: 49 U/L (ref 39–117)
BUN: 21 mg/dL (ref 6–23)
CO2: 28 meq/L (ref 19–32)
Calcium: 9.5 mg/dL (ref 8.4–10.5)
Chloride: 101 meq/L (ref 96–112)
Creatinine, Ser: 0.96 mg/dL (ref 0.40–1.20)
GFR: 73.61 mL/min (ref >60.00)
Glucose, Bld: 105 mg/dL — ABNORMAL HIGH (ref 70–99)
Potassium: 4.1 meq/L (ref 3.5–5.1)
Sodium: 136 meq/L (ref 135–145)
Total Bilirubin: 0.6 mg/dL (ref 0.2–1.2)
Total Protein: 7.4 g/dL (ref 6.0–8.3)

## 2023-06-18 LAB — VITAMIN D 25 HYDROXY (VIT D DEFICIENCY, FRACTURES): VITD: 58.51 ng/mL (ref 30.00–100.00)

## 2023-06-18 LAB — TSH: TSH: 0.99 u[IU]/mL (ref 0.35–5.50)

## 2023-06-18 LAB — VITAMIN B12: Vitamin B-12: 195 pg/mL — ABNORMAL LOW (ref 211–911)

## 2023-06-18 MED ORDER — SPIRONOLACTONE 25 MG PO TABS: 75.0000 mg | ORAL_TABLET | Freq: Every day | ORAL | 2 refills | Status: DC

## 2023-06-18 NOTE — Addendum Note (Signed)
Addended by: Kern Reap B on: 06/18/2023 08:16 AM   Modules accepted: Orders

## 2023-06-18 NOTE — Progress Notes (Signed)
Established Patient Office Visit     CC/Reason for Visit: Annual preventive exam  HPI: Marie Hanson is a 42 y.o. female who is coming in today for the above mentioned reasons.  Feeling well without major concerns.  Is requesting refill of spironolactone.  Needs her mammogram scheduled for January.  Has updated her flu vaccine.   Past Medical/Surgical History: Past Medical History:  Diagnosis Date   Anal fissure    Anxiety    Cardiac arrhythmia    during chidlhood   Depression    Eczema    Nipple pain x's 2 months   Rosacea    Urticaria     Past Surgical History:  Procedure Laterality Date   AUGMENTATION MAMMAPLASTY Bilateral    breast enhancement & removal   BREAST IMPLANT REMOVAL Bilateral    REFRACTIVE SURGERY Bilateral     Social History:  reports that she has never smoked. She has never been exposed to tobacco smoke. She has never used smokeless tobacco. She reports that she does not currently use alcohol. She reports that she does not use drugs.  Allergies: Allergies  Allergen Reactions   Benzoyl Peroxide Dermatitis   Iodine     Family History:  Family History  Problem Relation Age of Onset   Urticaria Mother    Eczema Mother    Allergic rhinitis Mother    Diabetes Mother    Colon polyps Mother    Irritable bowel syndrome Mother    Hyperlipidemia Mother    Depression Father    Anxiety disorder Father    BRCA 1/2 Neg Hx    Breast cancer Neg Hx      Current Outpatient Medications:    Adapalene 0.3 % gel, Apply 1 Application topically at bedtime., Disp: , Rfl:    Azelaic Acid 15 % gel, AFTER SKIN IS THOROUGHLY WASHED AND PATTED DRY, GENTLY AND THOROUGHLY APPLY A THIN FILM TO AFFECTED AREA IN THE MORNING AND AT NIGHT, Disp: 50 g, Rfl: 2   clindamycin (CLINDAGEL) 1 % gel, Apply topically 2 (two) times daily., Disp: 30 g, Rfl: 0   drospirenone-ethinyl estradiol ([name]) 3-0.02 MG tablet, Take 1 tablet by mouth daily., Disp: 90 tablet, Rfl:  1   EPINEPHrine 0.3 mg/0.3 mL IJ SOAJ injection, , Disp: , Rfl:    fexofenadine (ALLEGRA) 180 MG tablet, Take 180 mg by mouth daily., Disp: , Rfl:    hydrOXYzine (ATARAX) 25 MG tablet, Take 1 tablet (25 mg total) by mouth at bedtime as needed for itching., Disp: 30 tablet, Rfl: 3   metroNIDAZOLE (METROGEL) 0.75 % gel, Apply 1 Application topically daily., Disp: , Rfl:    MIEBO 1.338 GM/ML SOLN, Apply to eye., Disp: , Rfl:    montelukast (SINGULAIR) 10 MG tablet, Take 1 tablet (10 mg total) by mouth at bedtime., Disp: 30 tablet, Rfl: 5   Multiple Vitamin (MULTIVITAMIN) tablet, Take 1 tablet by mouth daily., Disp: , Rfl:    RESTASIS 0.05 % ophthalmic emulsion, , Disp: , Rfl:    spironolactone (ALDACTONE) 25 MG tablet, Take 3 tablets (75 mg total) by mouth daily., Disp: 90 tablet, Rfl: 2   XOLAIR 150 MG/ML prefilled syringe, Inject 300 mg into the skin every 28 (twenty-eight) days. (Patient not taking: Reported on 06/18/2023), Disp: , Rfl:   Current Facility-Administered Medications:    omalizumab Geoffry Paradise) prefilled syringe 300 mg, 300 mg, Subcutaneous, Q28 days, Verlee Monte, MD, 300 mg at 05/14/23 1025  Review of Systems:  Negative  unless indicated in HPI.   Physical Exam: Vitals:   06/18/23 0705  BP: 98/64  Pulse: 61  Temp: 97.6 F (36.4 C)  TempSrc: Oral  SpO2: 99%  Weight: 108 lb 6.4 oz (49.2 kg)  Height: 5\' 4"  (1.626 m)    Body mass index is 18.61 kg/m.   Physical Exam Vitals reviewed.  Constitutional:      General: She is not in acute distress.    Appearance: Normal appearance. She is not ill-appearing, toxic-appearing or diaphoretic.  HENT:     Head: Normocephalic.     Right Ear: Tympanic membrane, ear canal and external ear normal. There is no impacted cerumen.     Left Ear: Tympanic membrane, ear canal and external ear normal. There is no impacted cerumen.     Nose: Nose normal.     Mouth/Throat:     Mouth: Mucous membranes are moist.     Pharynx: Oropharynx is  clear. No oropharyngeal exudate or posterior oropharyngeal erythema.  Eyes:     General: No scleral icterus.       Right eye: No discharge.        Left eye: No discharge.     Conjunctiva/sclera: Conjunctivae normal.     Pupils: Pupils are equal, round, and reactive to light.  Neck:     Vascular: No carotid bruit.  Cardiovascular:     Rate and Rhythm: Normal rate and regular rhythm.     Pulses: Normal pulses.     Heart sounds: Normal heart sounds.  Pulmonary:     Effort: Pulmonary effort is normal. No respiratory distress.     Breath sounds: Normal breath sounds.  Abdominal:     General: Abdomen is flat. Bowel sounds are normal.     Palpations: Abdomen is soft.  Musculoskeletal:        General: Normal range of motion.     Cervical back: Normal range of motion.  Skin:    General: Skin is warm and dry.  Neurological:     General: No focal deficit present.     Mental Status: She is alert and oriented to person, place, and time. Mental status is at baseline.  Psychiatric:        Mood and Affect: Mood normal.        Behavior: Behavior normal.        Thought Content: Thought content normal.        Judgment: Judgment normal.     Flowsheet Row Office Visit from 06/18/2023 in [facility] [place]  PHQ-9 Total Score 4        Impression and Plan:  Encounter for preventive health examination -     CBC with Differential/Platelet; Future -     Comprehensive metabolic panel; Future -     TSH; Future -     Lipid panel; Future -     Vitamin B12; Future -     VITAMIN D 25 Hydroxy (Vit-D Deficiency, Fractures); Future  Acne vulgaris -     Spironolactone; Take 3 tablets (75 mg total) by mouth daily.  Dispense: 90 tablet; Refill: 2  Encounter for hepatitis C screening test for low risk patient -     Hepatitis C antibody; Future  -Recommend routine eye and dental care. -Healthy lifestyle discussed in detail. -Labs to be updated today. -Prostate cancer  screening: N/A Health Maintenance  Topic Date Due   Hepatitis C Screening  Never done   COVID-19 Vaccine (4 - 2023-24 season) 04/14/2023  Pap with HPV screening  11/15/2027   DTaP/Tdap/Td vaccine (2 - Td or Tdap) 03/04/2029   Flu Shot  Completed   HIV Screening  Completed   HPV Vaccine  Aged Out        Triton Heidrich Philip Aspen, MD [facility]

## 2023-06-19 ENCOUNTER — Ambulatory Visit: Payer: Managed Care, Other (non HMO) | Admitting: Dermatology

## 2023-06-19 VITALS — BP 130/77 | HR 77

## 2023-06-19 DIAGNOSIS — L853 Xerosis cutis: Secondary | ICD-10-CM | POA: Diagnosis not present

## 2023-06-19 DIAGNOSIS — L700 Acne vulgaris: Secondary | ICD-10-CM | POA: Diagnosis not present

## 2023-06-19 LAB — HEPATITIS C ANTIBODY: Hepatitis C Ab: NONREACTIVE

## 2023-06-19 NOTE — Progress Notes (Signed)
   Follow-Up Visit   Subjective  Marie Hanson is a 42 y.o. female who presents for the following: Acne  Patient present today for follow up visit for Acne. Patient was last evaluated on 04/16/23. Patient reports sxs are  Not at goal . She would like to discuss Accutane. Patient denies medication changes.  The following portions of the chart were reviewed this encounter and updated as appropriate: medications, allergies, medical history  Review of Systems:  No other skin or systemic complaints except as noted in HPI or Assessment and Plan.  Objective  Well appearing patient in no apparent distress; mood and affect are within normal limits.  A focused examination was performed of the following areas: Face  Relevant exam findings are noted in the Assessment and Plan.          Assessment & Plan   1. Acne Vulgaris - Assessment: Patient has been on spironolactone for almost 2 months, showing some improvement in acne size but continues to experience breakouts related to food intake. Increased anxiety reported while on spironolactone. - Plan: Consider low-dose Accutane (isotretinoin) as an alternative treatment option. Register patient in the iPledge program, obtain baseline labs (lipid profile, CBC, CMP), and perform a urine pregnancy test. Continue spironolactone until Accutane initiation, then discontinue spironolactone. Schedule follow-up in one month for another urine pregnancy test and to prescribe Accutane.  2. Dry Skin - Assessment: Patient is currently using hyaluronic acid and CeraVe moisturizer. - Plan: Offer samples of La Roche-Posay Double Repair Moisturizer and Triple Repair Moisturizer for the patient to try as alternatives to CeraVe. Advise using these moisturizers during Accutane treatment to manage dry skin.  3. Spironolactone Side Effects - Assessment: Patient reports experiencing anxiety, flushing, and increased urination while on spironolactone. - Plan: Monitor  potassium levels (current level 4.1) and plan to discontinue spironolactone upon initiation of Accutane treatment.  4. Patient Education - Assessment: Not applicable. - Plan: Educate patient on potential Accutane side effects, including dry skin, dry lips, sun sensitivity, and possible mood changes. Recommend appropriate lip products and sunscreen. Advise against waxing, facials, and blood donation while on Accutane. Instruct patient to take Accutane with a fatty meal for optimal absorption.   No follow-ups on file.    Documentation: I have reviewed the above documentation for accuracy and completeness, and I agree with the above.  Stasia Cavalier, am acting as scribe for Langston Reusing, DO.  Langston Reusing, DO

## 2023-06-19 NOTE — Patient Instructions (Signed)
Hello Miss Marie Hanson,  Thank you for visiting our clinic today. We are committed to supporting you in improving your skin health and appreciate your dedication to this journey. Here is a summary of the key instructions from today's consultation:  - Spironolactone: Continue taking Spironolactone (75 mg) until we initiate Accutane treatment, then discontinue a couple of days before starting Accutane.  - Accutane Treatment Plan:   - iPledge Program: Enroll as required.   - Lab Work: Complete necessary lab work, including a urine pregnancy test today.   - Follow-Up Tests: Undergo another urine pregnancy test in about a month, coinciding with your next scheduled visit.   - Dosage: Start with a low dose of 20 mg, with adjustments based on your weight.   - Skincare Restrictions: Avoid using skincare products containing active ingredients like benzoyl peroxide or salicylic acid while on Accutane.   - Administration: Take Accutane with a fatty meal to enhance absorption.  - Lifestyle Adjustments:   - Birth Control: Use two forms of birth control.   - Alcohol and Hydration: Avoid alcohol and ensure you are well-hydrated.   - Restrictions: Do not donate blood, get facials, or wax during treatment.   - Sun Protection: Wear sunscreen and limit sun exposure.  - Monitoring for Side Effects:   - Be vigilant for potential mood changes, headaches, joint pains, and abdominal pains.  - Skincare Products:   - Moisturizers: Provided samples of La Roche-Posay moisturizers to enhance skin hydration.  We will closely monitor your progress and make necessary adjustments to your treatment to ensure the best outcome for your skin health. Should you have any questions or concerns before your next visit, please do not hesitate to reach out to our office.  Warm regards,  Dr. Langston Reusing [facility]

## 2023-06-20 ENCOUNTER — Encounter: Payer: Self-pay | Admitting: Internal Medicine

## 2023-06-20 DIAGNOSIS — E538 Deficiency of other specified B group vitamins: Secondary | ICD-10-CM | POA: Insufficient documentation

## 2023-06-20 MED ORDER — BD SAFETYGLIDE SYRINGE/NEEDLE 25G X 1" 3 ML MISC: 3 refills | Status: DC

## 2023-06-20 MED ORDER — CYANOCOBALAMIN 1000 MCG/ML IJ SOLN
INTRAMUSCULAR | 11 refills | Status: DC
Start: 2023-06-20 — End: 2023-06-26

## 2023-06-22 LAB — LEUKOTRIENE E4, 24 HR, U
Collection Duration: 24 h
Creatinine Concentration,24 HR: 25 mg/dL
Creatinine, 24 HR, U: 1400 mg/(24.h) (ref 603–1783)
Leukotriene E4, U: 92 pg/mg{creat} (ref <=104)
Urine Volume: 5600 mL

## 2023-06-25 ENCOUNTER — Telehealth (INDEPENDENT_AMBULATORY_CARE_PROVIDER_SITE_OTHER): Payer: Managed Care, Other (non HMO) | Admitting: Internal Medicine

## 2023-06-25 ENCOUNTER — Encounter: Payer: Self-pay | Admitting: Internal Medicine

## 2023-06-25 VITALS — Wt 108.0 lb

## 2023-06-25 DIAGNOSIS — M67442 Ganglion, left hand: Secondary | ICD-10-CM

## 2023-06-25 DIAGNOSIS — E538 Deficiency of other specified B group vitamins: Secondary | ICD-10-CM | POA: Diagnosis not present

## 2023-06-25 NOTE — Progress Notes (Signed)
Virtual Visit via Video Note  I connected with Marie Hanson on 06/25/23 at  1:30 PM EST by a video enabled telemedicine application and verified that I am speaking with the correct person using two identifiers.  Location patient: home Location provider: work office Persons participating in the virtual visit: patient, provider  I discussed the limitations of evaluation and management by telemedicine and the availability of in person appointments. The patient expressed understanding and agreed to proceed.   HPI: Scheduled this visit to discuss 2 main issues:  1.  She was recently diagnosed with vitamin B12 deficiency.  We had recommended IM replacement but she is wondering if she can do p.o. replacement.  2.  She has a ganglion cyst in her left hand.  She had it aspirated but it has slowly grown back.  She is interested in permanent removal.   ROS: Negative unless indicated in HPI.  Past Medical History:  Diagnosis Date   Anal fissure    Anxiety    Cardiac arrhythmia    during chidlhood   Depression    Eczema    Nipple pain x's 2 months   Rosacea    Urticaria     Past Surgical History:  Procedure Laterality Date   AUGMENTATION MAMMAPLASTY Bilateral    breast enhancement & removal   BREAST IMPLANT REMOVAL Bilateral    REFRACTIVE SURGERY Bilateral     Family History  Problem Relation Age of Onset   Urticaria Mother    Eczema Mother    Allergic rhinitis Mother    Diabetes Mother    Colon polyps Mother    Irritable bowel syndrome Mother    Hyperlipidemia Mother    Depression Father    Anxiety disorder Father    BRCA 1/2 Neg Hx    Breast cancer Neg Hx     SOCIAL HX:   reports that she has never smoked. She has never been exposed to tobacco smoke. She has never used smokeless tobacco. She reports that she does not currently use alcohol. She reports that she does not use drugs.   Current Outpatient Medications:    Adapalene 0.3 % gel, Apply 1  Application topically at bedtime., Disp: , Rfl:    Azelaic Acid 15 % gel, AFTER SKIN IS THOROUGHLY WASHED AND PATTED DRY, GENTLY AND THOROUGHLY APPLY A THIN FILM TO AFFECTED AREA IN THE MORNING AND AT NIGHT, Disp: 50 g, Rfl: 2   clindamycin (CLINDAGEL) 1 % gel, Apply topically 2 (two) times daily., Disp: 30 g, Rfl: 0   cyanocobalamin (VITAMIN B12) 1000 MCG/ML injection, Inject 1 ml into the muscle once a week for a month.  Then injected 1 ml once a month thereafter, Disp: 6 mL, Rfl: 11   drospirenone-ethinyl estradiol ([name]) 3-0.02 MG tablet, Take 1 tablet by mouth daily., Disp: 90 tablet, Rfl: 1   EPINEPHrine 0.3 mg/0.3 mL IJ SOAJ injection, , Disp: , Rfl:    fexofenadine (ALLEGRA) 180 MG tablet, Take 180 mg by mouth daily., Disp: , Rfl:    hydrOXYzine (ATARAX) 25 MG tablet, Take 1 tablet (25 mg total) by mouth at bedtime as needed for itching., Disp: 30 tablet, Rfl: 3   metroNIDAZOLE (METROGEL) 0.75 % gel, Apply 1 Application topically daily., Disp: , Rfl:    MIEBO 1.338 GM/ML SOLN, Apply to eye., Disp: , Rfl:    montelukast (SINGULAIR) 10 MG tablet, Take 1 tablet (10 mg total) by mouth at bedtime., Disp: 30 tablet, Rfl: 5  Multiple Vitamin (MULTIVITAMIN) tablet, Take 1 tablet by mouth daily., Disp: , Rfl:    RESTASIS 0.05 % ophthalmic emulsion, , Disp: , Rfl:    spironolactone (ALDACTONE) 25 MG tablet, Take 3 tablets (75 mg total) by mouth daily., Disp: 90 tablet, Rfl: 2   SYRINGE-NEEDLE, DISP, 3 ML (BD SAFETYGLIDE SYRINGE/NEEDLE) 25G X 1" 3 ML MISC, Use for B12 injections, Disp: 100 each, Rfl: 3   XOLAIR 150 MG/ML prefilled syringe, Inject 300 mg into the skin every 28 (twenty-eight) days., Disp: , Rfl:   Current Facility-Administered Medications:    omalizumab Geoffry Paradise) prefilled syringe 300 mg, 300 mg, Subcutaneous, Q28 days, Verlee Monte, MD, 300 mg at 05/14/23 1025  EXAM:   VITALS per patient if applicable: None reported  GENERAL: alert, oriented, appears well and in no acute  distress  HEENT: atraumatic, conjunttiva clear, no obvious abnormalities on inspection of external nose and ears  NECK: normal movements of the head and neck  LUNGS: on inspection no signs of respiratory distress, breathing rate appears normal, no obvious gross increased work of breathing, gasping or wheezing  CV: no obvious cyanosis  MS: moves all visible extremities without noticeable abnormality  PSYCH/NEURO: pleasant and cooperative, no obvious depression or anxiety, speech and thought processing grossly intact  ASSESSMENT AND PLAN:   Vitamin B12 deficiency  Ganglion cyst of finger of left hand - Plan: Ambulatory referral to Hand Surgery  -Advised to look for B12 1000 mcg and to take 1 tablet daily. -Referral to hand surgery for removal of ganglion cyst.   I discussed the assessment and treatment plan with the patient. The patient was provided an opportunity to ask questions and all were answered. The patient agreed with the plan and demonstrated an understanding of the instructions.   The patient was advised to call back or seek an in-person evaluation if the symptoms worsen or if the condition fails to improve as anticipated.    [name], MD  [facility]

## 2023-06-25 NOTE — Progress Notes (Signed)
 Per patient no change in vitals since last visit, unable to obtain new vitals due to telehealth visit

## 2023-06-26 ENCOUNTER — Ambulatory Visit: Payer: Managed Care, Other (non HMO) | Attending: Rheumatology | Admitting: Rheumatology

## 2023-06-26 ENCOUNTER — Encounter: Payer: Self-pay | Admitting: Rheumatology

## 2023-06-26 ENCOUNTER — Encounter: Payer: Self-pay | Admitting: Internal Medicine

## 2023-06-26 VITALS — BP 119/74 | HR 64 | Resp 14 | Ht 63.0 in | Wt 107.6 lb

## 2023-06-26 DIAGNOSIS — M2550 Pain in unspecified joint: Secondary | ICD-10-CM | POA: Diagnosis not present

## 2023-06-26 DIAGNOSIS — J3089 Other allergic rhinitis: Secondary | ICD-10-CM | POA: Diagnosis not present

## 2023-06-26 DIAGNOSIS — J302 Other seasonal allergic rhinitis: Secondary | ICD-10-CM

## 2023-06-26 DIAGNOSIS — L700 Acne vulgaris: Secondary | ICD-10-CM

## 2023-06-26 DIAGNOSIS — F419 Anxiety disorder, unspecified: Secondary | ICD-10-CM

## 2023-06-26 DIAGNOSIS — L719 Rosacea, unspecified: Secondary | ICD-10-CM | POA: Diagnosis not present

## 2023-06-26 DIAGNOSIS — I7300 Raynaud's syndrome without gangrene: Secondary | ICD-10-CM

## 2023-06-26 DIAGNOSIS — Z9109 Other allergy status, other than to drugs and biological substances: Secondary | ICD-10-CM

## 2023-06-26 DIAGNOSIS — H1013 Acute atopic conjunctivitis, bilateral: Secondary | ICD-10-CM

## 2023-06-26 DIAGNOSIS — L508 Other urticaria: Secondary | ICD-10-CM

## 2023-06-26 LAB — C-KIT MUTATION, LIQUID TUMOR

## 2023-06-26 NOTE — Patient Instructions (Addendum)
Standing Labs We placed an order today for your standing lab work.   Please have your standing labs drawn in 1 year  Please have your labs drawn 2 weeks prior to your appointment so that the provider can discuss your lab results at your appointment, if possible.  Please note that you may see your imaging and lab results in MyChart before we have reviewed them. We will contact you once all results are reviewed. Please allow our office up to 72 hours to thoroughly review all of the results before contacting the office for clarification of your results.  WALK-IN LAB HOURS  Monday through Thursday from 8:00 am -12:30 pm and 1:00 pm-5:00 pm and Friday from 8:00 am-12:00 pm.  Patients with office visits requiring labs will be seen before walk-in labs.  You may encounter longer than normal wait times. Please allow additional time. Wait times may be shorter on  Monday and Thursday afternoons.  We do not book appointments for walk-in labs. We appreciate your patience and understanding with our staff.   Labs are drawn by [facility]. Please bring your co-pay at the time of your lab draw.  You may receive a bill from [facility] for your lab work.  Please note if you are on Hydroxychloroquine and and an order has been placed for a Hydroxychloroquine level,  you will need to have it drawn 4 hours or more after your last dose.  If you wish to have your labs drawn at another location, please call the office 24 hours in advance so we can fax the orders.  The office is located at [address], [place], [place] [address]   If you have any questions regarding directions or hours of operation,  please call [phone].   As a reminder, please drink plenty of water prior to coming for your lab work. Thanks!   Raynaud's Phenomenon  Raynaud's phenomenon is a condition that affects the blood vessels (arteries) that carry blood to the fingers and toes. The arteries that supply blood to the ears, lips,  nipples, or the tip of the nose might also be affected. Raynaud's phenomenon causes the arteries to become narrow temporarily (spasm). As a result, the flow of blood to the affected areas is temporarily decreased. This usually occurs in response to cold temperatures or stress. During an attack, the skin in the affected areas turns white, then blue, and finally red. A person may also feel tingling or numbness in those areas. Attacks usually last for only a brief period, and then the blood flow to the area returns to normal. In most cases, Raynaud's phenomenon does not cause serious health problems. What are the causes? In many cases, the cause of this condition is not known. The condition may occur on its own (primary Raynaud's phenomenon) or may be associated with other diseases or factors (secondary Raynaud's phenomenon). Possible causes may include: Diseases or medical conditions that damage the arteries. Injuries and repetitive actions that hurt the hands or feet. Being exposed to certain chemicals. Taking medicines that narrow the arteries. Other medical conditions, such as lupus, scleroderma, rheumatoid arthritis, thyroid problems, blood disorders, Sjogren syndrome, or atherosclerosis. What increases the risk? The following factors may make you more likely to develop this condition: Being 16-4 years old. Being female. Having a family history of Raynaud's phenomenon. Living in a cold climate. Smoking. What are the signs or symptoms? Symptoms of this condition usually occur when you are exposed to cold temperatures or when you have  emotional stress. The symptoms may last for a few minutes or up to several hours. They usually affect your fingers but may also affect your toes, nipples, lips, ears, or the tip of your nose. Symptoms may include: Changes in skin color. The skin in the affected areas will turn pale or white. The skin may then change from white to bluish to red as normal blood flow  returns to the area. Numbness, tingling, or pain in the affected areas. In severe cases, symptoms may include: Skin sores. Tissues decaying and dying (gangrene). How is this diagnosed? This condition may be diagnosed based on: Your symptoms and medical history. A physical exam. During the exam, you may be asked to put your hands in cold water to check for a reaction to cold temperature. Tests, such as: Blood tests to check for other diseases or conditions. A test to check the movement of blood through your arteries and veins (vascular ultrasound). A test in which the skin at the base of your fingernail is examined under a microscope (nailfold capillaroscopy). How is this treated? During an episode, you can take actions to help symptoms go away faster. Options include moving your arms around in a windmill pattern, warming your fingers under warm water, or placing your fingers in a warm body fold, such as your armpit. Long-term treatment for this condition often involves making lifestyle changes and taking steps to control your exposure to cold temperature. For more severe cases, medicine (calcium channel blockers) may be used to improve blood circulation. Follow these instructions at home: Avoiding cold temperatures Take these steps to avoid exposure to cold: If possible, stay indoors during cold weather. When you go outside during cold weather, dress in layers and wear mittens, a hat, a scarf, and warm footwear. Wear mittens or gloves when handling ice or frozen food. Use holders for glasses or cans containing cold drinks. Let warm water run for a while before taking a shower or bath. Warm up the car before driving in cold weather. Lifestyle If possible, avoid stressful and emotional situations. Try to find ways to manage your stress, such as: Exercise. Yoga. Meditation. Biofeedback. Do not use any products that contain nicotine or tobacco. These products include cigarettes, chewing  tobacco, and vaping devices, such as e-cigarettes. If you need help quitting, ask your health care provider. Avoid secondhand smoke. Limit your use of caffeine. Switch to decaffeinated coffee, tea, and soda. Avoid chocolate. Avoid vibrating tools and machinery. General instructions Protect your hands and feet from injuries, cuts, or bruises. Avoid wearing tight rings or wristbands. Wear loose fitting socks and comfortable, roomy shoes. Take over-the-counter and prescription medicines only as told by your health care provider. Where to find support Raynaud's Association: www.raynauds.org Where to find more information [employer] of Arthritis and Musculoskeletal and Skin Diseases: www.niams.http://www.myers.net/ Contact a health care provider if: Your discomfort becomes worse despite lifestyle changes. You develop sores on your fingers or toes that do not heal. You have breaks in the skin on your fingers or toes. You have a fever. You have pain or swelling in your joints. You have a rash. Your symptoms occur on only one side of your body. Get help right away if: Your fingers or toes turn black. You have severe pain in the affected areas. These symptoms may represent a serious problem that is an emergency. Do not wait to see if the symptoms will go away. Get medical help right away. Call your local emergency services (911 in the  [place] Do not drive yourself to the hospital. Summary Raynaud's phenomenon is a condition that affects the arteries that carry blood to the fingers, toes, ears, lips, nipples, or the tip of the nose. In many cases, the cause of this condition is not known. Symptoms of this condition include changes in skin color along with numbness and tingling in the affected area. Treatment for this condition includes lifestyle changes and reducing exposure to cold temperatures. Medicines may be used for severe cases of the condition. Contact your health care provider if your  condition worsens despite treatment. This information is not intended to replace advice given to you by your health care provider. Make sure you discuss any questions you have with your health care provider. Document Revised: 10/04/2020 Document Reviewed: 10/04/2020 Elsevier Patient Education  2024 [employer].

## 2023-06-27 ENCOUNTER — Encounter: Payer: Self-pay | Admitting: Dermatology

## 2023-06-27 ENCOUNTER — Other Ambulatory Visit: Payer: Self-pay | Admitting: Internal Medicine

## 2023-06-27 LAB — PROSTAGLANDIN D2, SERUM: Prostaglandin D2, serum: 4.9 pg/mL

## 2023-07-01 ENCOUNTER — Encounter: Payer: Self-pay | Admitting: Internal Medicine

## 2023-07-02 ENCOUNTER — Encounter: Payer: Self-pay | Admitting: Internal Medicine

## 2023-07-02 NOTE — Progress Notes (Signed)
Received results of SIBO breath test, which showed a normal increase in hydrogen gas at 8 ppm (nml< 20 ppm) and normal increase in methane at 1 ppm (nml <12 ppm). This suggests that the patient does not have small intestinal bacterial overgrowth.   Tisha, please let the patient know that her SIBO breath test was negative for SIBO.

## 2023-07-03 NOTE — Telephone Encounter (Signed)
Good morning,  The serum prostaglandin level was normal, urine leukotrienes were normal, and there was no c-Kit mutation found.  All of this would rule out mast cell activation syndrome.  I am not sure which lab she is referring to that is low.

## 2023-07-04 ENCOUNTER — Telehealth: Payer: Self-pay

## 2023-07-04 NOTE — Telephone Encounter (Signed)
Patient called to follow up to see if we had received her SIBO results. Please advise.

## 2023-07-04 NOTE — Telephone Encounter (Signed)
Spoke to patient advised per provider her SIBO breath test was negative for SIBO. Patient verbalized understanding.

## 2023-07-08 LAB — OTHER LAB TEST

## 2023-07-09 ENCOUNTER — Telehealth: Payer: Self-pay | Admitting: Orthopaedic Surgery

## 2023-07-09 ENCOUNTER — Other Ambulatory Visit: Payer: Self-pay | Admitting: Internal Medicine

## 2023-07-09 NOTE — Telephone Encounter (Signed)
Offered patient dates for ganglion palm surgery based on Dr. Warren Danes schedule of [facility] on Wednesdays and [facility] on Thursdays. The earliest start time for both locations would be at noon, however all time slots for noon have been taken.  I did offer 1pm start time with 11am arrival on 07-17-23.  Patient said she would need to rethink surgery because of going NPO.  I also offered asking a patient to switch surgery start times with her, but I could not guarantee anything.  At this time patient has declined these offers and said she may call back.

## 2023-07-09 NOTE — Telephone Encounter (Signed)
Patient left message on voicemail.  Said she was seen for a ganglion cyst in the left palm.  It was aspirated but it is coming back.  She would like to go ahead and schedule surgery.  Please provide surgery order.

## 2023-07-15 ENCOUNTER — Encounter: Payer: Self-pay | Admitting: Internal Medicine

## 2023-07-16 NOTE — Progress Notes (Deleted)
Office Visit Note  Patient: Marie Hanson             Date of Birth: 1981/04/02           MRN: [id number]             PCP: Philip Aspen, Limmie Patricia, MD Referring: Philip Aspen, Almira Bar* Visit Date: 07/30/2023 Occupation: @GUAROCC @  Subjective:  No chief complaint on file.   History of Present Illness: Marie Hanson is a 42 y.o. female ***     Activities of Daily Living:  Patient reports morning stiffness for *** {minute/hour:19697}.   Patient {ACTIONS;DENIES/REPORTS:21021675::"Denies"} nocturnal pain.  Difficulty dressing/grooming: {ACTIONS;DENIES/REPORTS:21021675::"Denies"} Difficulty climbing stairs: {ACTIONS;DENIES/REPORTS:21021675::"Denies"} Difficulty getting out of chair: {ACTIONS;DENIES/REPORTS:21021675::"Denies"} Difficulty using hands for taps, buttons, cutlery, and/or writing: {ACTIONS;DENIES/REPORTS:21021675::"Denies"}  No Rheumatology ROS completed.   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   Raynaud's phenomenon 06/18/2022   Rosacea 03/05/2019   Anxiety 03/05/2019    Past Medical History:  Diagnosis Date   Anal fissure    Anxiety    Cardiac arrhythmia    during chidlhood   Depression    Eczema    Nipple pain x's 2 months   Rosacea    Urticaria     Family History  Problem Relation Age of Onset   Urticaria Mother    Eczema Mother    Allergic rhinitis Mother    Diabetes Mother    Colon polyps Mother    Irritable bowel syndrome Mother    Hyperlipidemia Mother    Depression Father    Anxiety disorder Father    BRCA 1/2 Neg Hx    Breast cancer Neg Hx    Past Surgical History:  Procedure Laterality Date   AUGMENTATION MAMMAPLASTY Bilateral    breast  enhancement & removal   BREAST IMPLANT REMOVAL Bilateral    REFRACTIVE SURGERY Bilateral    Social History   Social History Narrative   Not on file   Immunization History  Administered Date(s) Administered   Influenza, High Dose Seasonal PF 03/31/2022   Influenza, Seasonal, Injecte, Preservative Fre 05/11/2023   Influenza,inj,Quad PF,6+ Mos 03/28/2019   Influenza-Unspecified 04/03/2019, 04/20/2020, 04/27/2021   PFIZER(Purple Top)SARS-COV-2 Vaccination 11/12/2019, 12/07/2019, 07/01/2020   Tdap 03/05/2019     Objective: Vital Signs: There were no vitals taken for this visit.   Physical Exam   Musculoskeletal Exam: ***  CDAI Exam: CDAI Score: -- Patient Global: --; Provider Global: -- Swollen: --; Tender: -- Joint Exam 07/30/2023   No joint exam has been documented for this visit   There is currently no information documented on the homunculus. Go to the Rheumatology activity and complete the homunculus joint exam.  Investigation: No additional findings.  Imaging: No results found.  Recent Labs: Lab Results  Component Value Date   WBC 5.3 06/18/2023   HGB 14.7 06/18/2023   PLT 202.0 06/18/2023   NA 136 06/18/2023   K 4.1 06/18/2023   CL 101 06/18/2023   CO2 28 06/18/2023   GLUCOSE 105 (H) 06/18/2023   BUN 21 06/18/2023   CREATININE 0.96 06/18/2023   BILITOT 0.6 06/18/2023   ALKPHOS 49 06/18/2023   AST 23 06/18/2023   ALT 40 (H) 06/18/2023   PROT 7.4 06/18/2023  ALBUMIN 4.4 06/18/2023   CALCIUM 9.5 06/18/2023    Speciality Comments: No specialty comments available.  Procedures:  No procedures performed Allergies: Benzoyl peroxide and Iodine   Assessment / Plan:     Visit Diagnoses: No diagnosis found.  Orders: No orders of the defined types were placed in this encounter.  No orders of the defined types were placed in this encounter.   Face-to-face time spent with patient was *** minutes. Greater than 50% of time was spent in counseling and  coordination of care.  Follow-Up Instructions: No follow-ups on file.   Pollyann Savoy, MD  Note - This record has been created using Animal nutritionist.  Chart creation errors have been sought, but may not always  have been located. Such creation errors do not reflect on  the standard of medical care.

## 2023-07-18 ENCOUNTER — Encounter: Payer: Self-pay | Admitting: Obstetrics and Gynecology

## 2023-07-18 ENCOUNTER — Ambulatory Visit: Payer: Managed Care, Other (non HMO) | Admitting: Obstetrics and Gynecology

## 2023-07-18 VITALS — BP 102/76 | HR 71 | Ht 63.0 in | Wt 106.0 lb

## 2023-07-18 DIAGNOSIS — L700 Acne vulgaris: Secondary | ICD-10-CM | POA: Diagnosis not present

## 2023-07-18 DIAGNOSIS — R232 Flushing: Secondary | ICD-10-CM | POA: Diagnosis not present

## 2023-07-18 DIAGNOSIS — N898 Other specified noninflammatory disorders of vagina: Secondary | ICD-10-CM

## 2023-07-18 LAB — WET PREP FOR TRICH, YEAST, CLUE

## 2023-07-18 MED ORDER — TRI-LO-SPRINTEC 0.18/0.215/0.25 MG-25 MCG PO TABS: 1.0000 | ORAL_TABLET | Freq: Every day | ORAL | 3 refills | Status: DC

## 2023-07-18 MED ORDER — NORGESTIMATE-ETH ESTRADIOL 0.18/0.215/0.25 MG-25 MCG PO TABS: 1.0000 | ORAL_TABLET | Freq: Every day | ORAL | 3 refills | Status: DC

## 2023-07-18 NOTE — Progress Notes (Signed)
42 y.o. G0P0000 female on COC here for PCOS consultation.  Patient reports being diagnosed with COVID last December and has noticed multiple change since. Pt c/o vaginal itching and irritation. Hx of BV over the past two months. Using boric acid daily. Also notes food allergies now requiring a stricter diet. More irritable, acne (chronic). Having daily hot flashes in afternoons. Also worried about PCOS. Normal cycles with the pill. Reports that her mother went through menopause in her later 22s. PHQ-9 > 6 today, hx of anxiety and depression requiring medical therapy, now currently in therapy x1 yr.  Patient's last menstrual period was 07/05/2023. Period Duration (Days): 2 Period Pattern: Regular Menstrual Flow: Heavy Menstrual Control: Maxi pad, Tampon Dysmenorrhea: None  Birth control: COC  OB History  Gravida Para Term Preterm AB Living  0 0 0 0 0 0  SAB IAB Ectopic Multiple Live Births  0 0 0 0 0    Past Medical History:  Diagnosis Date   Anal fissure    Anxiety    Cardiac arrhythmia    during chidlhood   Depression    Eczema    Nipple pain x's 2 months   Rosacea    Urticaria     Past Surgical History:  Procedure Laterality Date   AUGMENTATION MAMMAPLASTY Bilateral    breast enhancement & removal   BREAST IMPLANT REMOVAL Bilateral    REFRACTIVE SURGERY Bilateral     Current Outpatient Medications on File Prior to Visit  Medication Sig Dispense Refill   Adapalene 0.3 % gel Apply 1 Application topically at bedtime.     Azelaic Acid 15 % gel AFTER SKIN IS THOROUGHLY WASHED AND PATTED DRY, GENTLY AND THOROUGHLY APPLY A THIN FILM TO AFFECTED AREA IN THE MORNING AND AT NIGHT 50 g 2   cromolyn (GASTROCROM) 100 MG/5ML solution Take 200 mg by mouth 4 (four) times daily.     EPINEPHrine 0.3 mg/0.3 mL IJ SOAJ injection      fexofenadine (ALLEGRA) 180 MG tablet Take 180 mg by mouth daily.     hydrOXYzine (ATARAX) 25 MG tablet TAKE 1 TABLET BY MOUTH AT BEDTIME AS  NEEDED FOR ITCHING. 90 tablet 2   metroNIDAZOLE (METROGEL) 0.75 % gel Apply 1 Application topically daily.     MIEBO 1.338 GM/ML SOLN Apply to eye.     montelukast (SINGULAIR) 10 MG tablet Take 1 tablet (10 mg total) by mouth at bedtime. 30 tablet 5   Multiple Vitamin (MULTIVITAMIN) tablet Take 1 tablet by mouth daily.     RESTASIS 0.05 % ophthalmic emulsion      spironolactone (ALDACTONE) 25 MG tablet Take 3 tablets (75 mg total) by mouth daily. 90 tablet 2   WINLEVI 1 % CREA SMARTSIG:sparingly Topical Twice Daily     Current Facility-Administered Medications on File Prior to Visit  Medication Dose Route Frequency Provider Last Rate Last Admin   omalizumab Geoffry Paradise) prefilled syringe 300 mg  300 mg Subcutaneous Q28 days Verlee Monte, MD   300 mg at 05/14/23 1025    Allergies  Allergen Reactions   Benzoyl Peroxide Dermatitis   Iodine    Tri-Lo-Marzia [Norgestim-Eth Estrad Triphasic] Other (See Comments)    Per pt-had issues with inactive ingredients in the past.      PE Today's Vitals   07/18/23 1035  BP: 102/76  Pulse: 71  SpO2: 98%  Weight: 106 lb (48.1 kg)  Height: 5\' 3"  (1.6 m)   Body mass index is 18.78 kg/m.  Physical Exam Vitals reviewed. Exam conducted with a chaperone present.  Constitutional:      General: She is not in acute distress.    Appearance: Normal appearance.  HENT:     Head: Normocephalic and atraumatic.     Nose: Nose normal.  Eyes:     Extraocular Movements: Extraocular movements intact.     Conjunctiva/sclera: Conjunctivae normal.  Pulmonary:     Effort: Pulmonary effort is normal.  Genitourinary:    General: Normal vulva.     Exam position: Lithotomy position.     Vagina: Normal. No vaginal discharge.     Cervix: Normal. No cervical motion tenderness, discharge or lesion.     Uterus: Normal. Not enlarged and not tender.      Adnexa: Right adnexa normal and left adnexa normal.  Musculoskeletal:        General: Normal range of motion.      Cervical back: Normal range of motion.  Neurological:     General: No focal deficit present.     Mental Status: She is alert.  Psychiatric:        Mood and Affect: Mood normal.        Behavior: Behavior normal.      Assessment and Plan:        Hot flashes Assessment & Plan: Will check [facility] Will swtich to tri-lo-sprintec per patient request. Felt she did better on this medication. Reviewed that COC are effective for treating perimenopausal sx Pt inquires about starting additional medications for hot flashes Recommend adjusting one therapy at a time Tri-lo-sprintec is an increase in estrogen from her current pill, so should assist with perimenopausal hot flashes if this is the etiology. F/u in 3 months    Orders: -     Estradiol -     Follicle stimulating hormone  Acne vulgaris Assessment & Plan: On spironolactone and topical therapies, continue  Orders: -     Testos,Total,Free and SHBG (Female)  Vaginal discharge -     WET PREP FOR TRICH, YEAST, CLUE   Rosalyn Gess, MD

## 2023-07-18 NOTE — Telephone Encounter (Signed)
Per GH: "Tri-lo-sprintec is what was sent to pharmacy. Please look into this."  Per investigation online: there are some shared inactive ingredients within the two but also some different inactive ingredients.   Called pharmacy that rx was sent to to speak w/ pharmacist to confirm. It was confirmed that yes, some of the inactive ingredients are different but minimal. However, will dispense per pt's preference.  Then, inquired if pharmacy needed rx written as "DAW" or if they could even get the "Tri-lo-sprintec" brand from their manufacturer per pt's preference.   Was advised that they will discontinue current rx on file and requested that we send another that is the same but need to say/mark "DAW."  Rx pend for review/authorization.  Will notify the pt once rx is re-sent.

## 2023-07-22 ENCOUNTER — Encounter: Payer: Self-pay | Admitting: Dermatology

## 2023-07-22 ENCOUNTER — Ambulatory Visit (INDEPENDENT_AMBULATORY_CARE_PROVIDER_SITE_OTHER): Payer: Managed Care, Other (non HMO) | Admitting: Dermatology

## 2023-07-22 VITALS — BP 121/79 | HR 75

## 2023-07-22 DIAGNOSIS — L700 Acne vulgaris: Secondary | ICD-10-CM

## 2023-07-22 DIAGNOSIS — L709 Acne, unspecified: Secondary | ICD-10-CM

## 2023-07-22 DIAGNOSIS — Z79899 Other long term (current) drug therapy: Secondary | ICD-10-CM

## 2023-07-22 DIAGNOSIS — L539 Erythematous condition, unspecified: Secondary | ICD-10-CM | POA: Diagnosis not present

## 2023-07-22 MED ORDER — SAFETY SEAL MISCELLANEOUS MISC: 1.0000 [IU] | Freq: Every morning | 3 refills | Status: DC

## 2023-07-22 NOTE — Progress Notes (Signed)
   Follow-Up Visit   Subjective  Marie Hanson is a 42 y.o. female who presents for the following: acne on the face Pt here for follow up to acne on the face. She had been considering accutane but decided to hold off on it for now. Pt is currently using azeliac acid, metro gel, and adapalene as well as taking spironolactone orally. She washes with first aid beauty cleanser. Pt states that her acne is well controlled. She is having flushing and hot flashes and would like to discuss lowering dose of spironolactone.    The following portions of the chart were reviewed this encounter and updated as appropriate: medications, allergies, medical history  Review of Systems:  No other skin or systemic complaints except as noted in HPI or Assessment and Plan.  Objective  Well appearing patient in no apparent distress; mood and affect are within normal limits.  A focused examination was performed of the following areas: acne  Relevant exam findings are noted in the Assessment and Plan.      Exam: Previous open comedones and inflammatory papules on face but today clear   Assessment & Plan    Acne and Facial Redness - Assessment: The patient's acne is well-controlled with the current treatment, and her complexion is described as "great." She is experiencing flushing likely related to rosacea. The patient is currently on spironolactone 100mg , recently increased from 25mg , which may be contributing to hot flashes. Lab results indicate very low estrogen levels (<15), potentially causing hot flashes. The patient has been on birth control (generic version of Yaz) for 4 years, complicating the interpretation of hormone levels and possibly masking perimenopausal status. The consideration of Accutane treatment is paused due to concerns about side effects.  - Plan:   - Continue spironolactone 100mg  daily, with the option to decrease if side effects persist.   - Continue current topical regimen:  metronidazole gel 0.75% once daily, azelaic acid 15% gel once daily (morning), and adapalene 0.3% gel at night.   - Prescribe compounded topical medication with Afrin for redness, to be filled by [facility] pharmacy.   - Recommend Avene Anti-rougeurs line products for rosacea management (cleanser and cream, morning and night).   - Follow up with a gynecologist regarding hormone replacement options (higher estrogen birth control or Combipatch).   - Monitor for results of pending testosterone and sex hormone-binding globulin tests.   - Follow up in 3 months to assess the efficacy of treatment adjustments.   - Patient to contact via MyChart if deciding to reduce the spironolactone dose before the next appointment.     Owens Shark, CMA, am acting as scribe for [employer], DO.   Documentation: I have reviewed the above documentation for accuracy and completeness, and I agree with the above.  Langston Reusing, DO

## 2023-07-22 NOTE — Patient Instructions (Addendum)
Hello Marie Hanson,  Thank you for visiting [place] today. We appreciate your commitment to improving your health and managing your dermatological needs. Here is a summary of the key instructions from today's consultation:  - Spironolactone: Continue with the current dose of 100 mg. Monitor for any changes and communicate via MyChart if adjustments are needed.  - Birth Control: Discussion about possibly switching from Yaz to Tri-Lo Sprintec to manage hot flashes, pending further advice from your gynecologist.  - Estrogen Levels: Consider options to manage low estrogen levels, either by adjusting birth control or adding an estrogen-progesterone patch (Combipatch).  - Dermatological Treatments:   - Continue using Metronidazole gel 0.75% and Azelaic Acid 15% gel as currently prescribed.   - Adapalene 0.3% gel usage to continue as per your current regimen.   - Avene Anti-rougeurs line recommended for rosacea, including cleanser and moisturizer with sunscreen.   - New compounded medication for redness to be applied in the morning will be prepared by [facility] pharmacy; expect a call for confirmation.  - Follow-Up: Schedule a follow-up appointment in three months to review the effectiveness of the new dosages and treatments.  Please feel free to reach out via MyChart for any immediate concerns or adjustments. We look forward to seeing you again and wish you a wonderful holiday season and a happy new year!  Warm regards,  Dr. Langston Reusing [facility]      Important Information   Due to recent changes in healthcare laws, you may see results of your pathology and/or laboratory studies on MyChart before the doctors have had a chance to review them. We understand that in some cases there may be results that are confusing or concerning to you. Please understand that not all results are received at the same time and often the doctors may need to interpret multiple results in order to provide you with the best  plan of care or course of treatment. Therefore, we ask that you please give [place] 2 business days to thoroughly review all your results before contacting the office for clarification. Should we see a critical lab result, you will be contacted sooner.     If You Need Anything After Your Visit   If you have any questions or concerns for your doctor, please call our main line at [phone]. If no one answers, please leave a voicemail as directed and we will return your call as soon as possible. Messages left after 4 pm will be answered the following business day.    You may also send [place] a message via MyChart. We typically respond to MyChart messages within 1-2 business days.  For prescription refills, please ask your pharmacy to contact our office. Our fax number is [phone].  If you have an urgent issue when the clinic is closed that cannot wait until the next business day, you can page your doctor at the number below.     Please note that while we do our best to be available for urgent issues outside of office hours, we are not available 24/7.    If you have an urgent issue and are unable to reach [place], you may choose to seek medical care at your doctor's office, retail clinic, urgent care center, or emergency room.   If you have a medical emergency, please immediately call 911 or go to the emergency department. In the event of inclement weather, please call our main line at [phone] for an update on the status of any delays or closures.  Dermatology  Medication Tips: Please keep the boxes that topical medications come in in order to help keep track of the instructions about where and how to use these. Pharmacies typically print the medication instructions only on the boxes and not directly on the medication tubes.   If your medication is too expensive, please contact our office at [phone] or send [place] a message through MyChart.    We are unable to tell what your co-pay for medications  will be in advance as this is different depending on your insurance coverage. However, we may be able to find a substitute medication at lower cost or fill out paperwork to get insurance to cover a needed medication.    If a prior authorization is required to get your medication covered by your insurance company, please allow [place] 1-2 business days to complete this process.   Drug prices often vary depending on where the prescription is filled and some pharmacies may offer cheaper prices.   The website www.goodrx.com contains coupons for medications through different pharmacies. The prices here do not account for what the cost may be with help from insurance (it may be cheaper with your insurance), but the website can give you the price if you did not use any insurance.  - You can print the associated coupon and take it with your prescription to the pharmacy.  - You may also stop by our office during regular business hours and pick up a GoodRx coupon card.  - If you need your prescription sent electronically to a different pharmacy, notify our office through [facility] or by phone at [phone]

## 2023-07-24 ENCOUNTER — Encounter: Payer: Self-pay | Admitting: Internal Medicine

## 2023-07-25 ENCOUNTER — Encounter: Payer: Self-pay | Admitting: Obstetrics and Gynecology

## 2023-07-26 ENCOUNTER — Encounter: Payer: Self-pay | Admitting: Obstetrics and Gynecology

## 2023-07-26 DIAGNOSIS — R232 Flushing: Secondary | ICD-10-CM | POA: Insufficient documentation

## 2023-07-26 LAB — TESTOS,TOTAL,FREE AND SHBG (FEMALE)
Free Testosterone: 0.4 pg/mL (ref 0.1–6.4)
Testosterone, Total, LC-MS-MS: 8 ng/dL (ref 2–45)

## 2023-07-26 LAB — FOLLICLE STIMULATING HORMONE: FSH: <0.7 m[IU]/mL — ABNORMAL LOW

## 2023-07-26 LAB — ESTRADIOL: Estradiol: <15 pg/mL

## 2023-07-26 NOTE — Assessment & Plan Note (Addendum)
Will check [facility] Will swtich to tri-lo-sprintec per patient request. Felt she did better on this medication. Reviewed that COC are effective for treating perimenopausal sx Pt inquires about starting additional medications for hot flashes Recommend adjusting one therapy at a time Tri-lo-sprintec is an increase in estrogen from her current pill, so should assist with perimenopausal hot flashes if this is the etiology. F/u in 3 months

## 2023-07-26 NOTE — Assessment & Plan Note (Signed)
On spironolactone and topical therapies, continue

## 2023-07-26 NOTE — Addendum Note (Signed)
Addended by: Darrell Jewel V on: 07/26/2023 09:19 AM   Modules accepted: Orders

## 2023-07-28 ENCOUNTER — Encounter: Payer: Self-pay | Admitting: Internal Medicine

## 2023-07-29 ENCOUNTER — Other Ambulatory Visit: Payer: Managed Care, Other (non HMO)

## 2023-07-29 ENCOUNTER — Other Ambulatory Visit: Payer: Self-pay

## 2023-07-29 ENCOUNTER — Encounter: Payer: Self-pay | Admitting: Dermatology

## 2023-07-29 ENCOUNTER — Other Ambulatory Visit: Payer: Self-pay | Admitting: *Deleted

## 2023-07-29 DIAGNOSIS — L700 Acne vulgaris: Secondary | ICD-10-CM

## 2023-07-29 DIAGNOSIS — R232 Flushing: Secondary | ICD-10-CM

## 2023-07-29 MED ORDER — SPIRONOLACTONE 50 MG PO TABS: 125.0000 mg | ORAL_TABLET | Freq: Every day | ORAL | 4 refills | Status: DC

## 2023-07-29 NOTE — Telephone Encounter (Signed)
Hi Jetta,  We saw this pt together last week.  Please let her know that Spironolatone does not come in 25mg  tablets.  She would need to take 2.5 of the 50mg  tablets to get a dose of 125mg .  Please call her and let her know that.  If she's ok with breaking tablets then she may need an Rx for the 50mg  tablets (90 for a months supply, the sig will be take 2.5 tablets daily.  )  Thanks!

## 2023-07-30 ENCOUNTER — Ambulatory Visit: Payer: Managed Care, Other (non HMO) | Admitting: Rheumatology

## 2023-07-31 LAB — ANTI-MULLERIAN HORMONE (AMH), FEMALE: Anti-Mullerian Hormones(AMH), Female: 0.2 ng/mL (ref 0.01–2.99)

## 2023-08-02 ENCOUNTER — Other Ambulatory Visit (INDEPENDENT_AMBULATORY_CARE_PROVIDER_SITE_OTHER): Payer: Self-pay

## 2023-08-02 ENCOUNTER — Encounter: Payer: Self-pay | Admitting: Sports Medicine

## 2023-08-02 ENCOUNTER — Ambulatory Visit (INDEPENDENT_AMBULATORY_CARE_PROVIDER_SITE_OTHER): Payer: Managed Care, Other (non HMO) | Admitting: Sports Medicine

## 2023-08-02 DIAGNOSIS — M51369 Other intervertebral disc degeneration, lumbar region without mention of lumbar back pain or lower extremity pain: Secondary | ICD-10-CM

## 2023-08-02 DIAGNOSIS — M357 Hypermobility syndrome: Secondary | ICD-10-CM

## 2023-08-02 DIAGNOSIS — M25551 Pain in right hip: Secondary | ICD-10-CM | POA: Diagnosis not present

## 2023-08-02 DIAGNOSIS — M5410 Radiculopathy, site unspecified: Secondary | ICD-10-CM

## 2023-08-02 NOTE — Progress Notes (Signed)
Patient says that she has had tightness in the right hip for several years. She says that she feels it most in the front of the hip and groin. Patient says that she did do physical therapy about 1 year ago but did not get much relief. She says she is active and works out a few days a week and has had to ease up from higher intensity work outs due to the hip and other health concerns. She says that she does stretch daily. Patient says that she has numbness that runs from the front of the hip down the front of the leg, medial side of the knee, and down through the big toe of her right foot. She says that this numbness now happens daily and she does not notice a pattern.

## 2023-08-02 NOTE — Progress Notes (Signed)
Marie Hanson - 42 y.o. female MRN [id number]  Date of birth: 1980-11-23  Office Visit Note: Visit Date: 08/02/2023 PCP: Philip Aspen, Limmie Patricia, MD Referred by: Philip Aspen, Estel*  Subjective: Chief Complaint  Patient presents with   Right Hip - Pain   HPI: Marie Hanson is a pleasant 42 y.o. female who presents today for right hip pain/tightness and numbness down the RLE.  Marie Hanson reports having tightness over the anterior hip for the past several years, at times this will go into the groin.  She does not have any pain within the hip however. She has been reporting numbness that starts low back/posterior hip and will radiate down the anterior and medial thigh down the lower leg into the big toe. This numbness has been present for the past 3 months.  Last year she was having more sciatic like pain but this has resolved.  She does also report some weakness in the anterior thigh/groin and at times feels like the right ankle is not as strong as her left. 1 year ago she did perform some formalized physical therapy but did not get much relief from this.  She is very active and does workout at home multiple times weekly.  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  Of note, back on 12/11/2022 she did have an interlaminar L5-S1 injection in the midline that did not give her any relief.  Pertinent ROS were reviewed with the patient and found to be negative unless otherwise specified above in HPI.   Assessment & Plan: Visit Diagnoses:  1. Radicular pain of right lower extremity   2. Pain in right hip   3. Bulging of lumbar intervertebral disc   4. Benign joint hypermobility    Plan: Discussed with Marie Hanson the nature of her right hip pain/tightness and right leg numbness.  She did have a previous MRI with a disc bulge at the L4-L5 level with a notable annular fissure.  She did had an IL L5-S1 injection without  much relief, but I am curious if this may have been more beneficial if in a different spinal location.  Most of her symptoms are numbness and do seem to fit the L4 dermatome.  I would like to send her to my partner, Dr. Alvester Morin to evaluate her MRI and see if an injection in the L4-L5 region near her annular fissure may help improve her radicular symptoms.  If he does not think this is helpful, we may consider in performing EMG/nerve conduction studies given her RLE numbness and her reported weakness in the thigh and at the ankle joint.  In terms of her tightness and discomfort in the hip, I cannot reproduce intra-articular symptoms.  She does have notable joint hypermobility with excessive motion of both of her hips, I do think this is playing a role in her discomfort.  I did give her my hip stabilization exercises for her to perform at home.  Once her evaluation and possible treatment with Dr. Alvester Morin is completed, I would like to see her in follow-up/  Upon chart review, she has had issues with flushing, urticaria. She did tell me about her symptoms of mast cell hypersensitivity - given this with her joint hypermobility, possible that she is dealing with sequale of EDS? Will re-visit this at f/u.   Follow-up: Return for with Dr. Gillie Manners (will f/u after his treatment completed).   Meds & Orders: No  orders of the defined types were placed in this encounter.   Orders Placed This Encounter  Procedures   XR HIP UNILAT W OR W/O PELVIS 2-3 VIEWS RIGHT   Ambulatory referral to Physical Medicine Rehab     Procedures: No procedures performed      Clinical History:  - Lumbar MRI 11/27/22:   Objective:   Vital Signs: LMP 07/05/2023   Physical Exam  Gen: Well-appearing, in no acute distress; non-toxic CV: Regular Rate. Well-perfused. Warm.  Resp: Breathing unlabored on room air; no wheezing. Psych: Fluid speech in conversation; appropriate affect; normal thought process Neuro: Sensation intact  throughout. No gross coordination deficits.   Ortho Exam - Lumbar: No midline spinous process TTP.  Full range of motion with flexion and extension.  There is 5/5 strength of bilateral lower extremities to L4-S1 nerve root distribution.  2+ patellar and Achilles DTRs bilaterally.  Negative straight leg raise bilaterally.  - Right hip: No bony TTP or greater trochanteric TTP.  Fluid logroll with internal and external motion.  Negative FADIR, negative Stinchfield, negative FABER testing.  There is excessive range of motion with joint laxity noted with Ext rotation > internal rotation but bilateral.  - Beighton score: 4/9  Imaging: XR HIP UNILAT W OR W/O PELVIS 2-3 VIEWS RIGHT Result Date: 08/02/2023 Kidneys of the right hip including AP and lateral femoral ordered and reviewed by myself.  X-rays show preserved hip joint without any arthritic change.  There is a likely benign bone island in the femoral head.  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  No acute bony abnormalities noted.   *Independent review and interpretation of lumbar spine MRI from 11/27/2022 was performed by myself today.  X-rays show a broad-based posterior disc bulge at the L4-L5 level with evidence of an annular fissure.  There is mild central to paracentral disc protrusion with possible nerve encroachment but no evidence of gross neural impingement.  Narrative & Impression  CLINICAL DATA:  Low back pain for over 1 year. Pain radiates to bilateral legs.   EXAM: MRI LUMBAR SPINE WITHOUT CONTRAST   TECHNIQUE: Multiplanar, multisequence MR imaging of the lumbar spine was performed. No intravenous contrast was administered.   COMPARISON:  None Available.   FINDINGS: Segmentation:  Standard.   Alignment:  Physiologic.   Vertebrae: No acute fracture, evidence of discitis, or aggressive bone lesion.   Conus medullaris and cauda equina: Conus extends to the T12-L1 level. Conus and  cauda equina appear normal.   Paraspinal and other soft tissues: No acute paraspinal abnormality.   Disc levels:   Disc spaces: Disc desiccation L4-5. Disc heights are relatively well maintained.   T12-L1: Mild broad-based disc bulge. No foraminal or central canal stenosis.   L1-L2: Minimal broad-based disc bulge. No foraminal or central canal stenosis.   L2-L3: No significant disc bulge. No neural foraminal stenosis. No central canal stenosis.   L3-L4: No significant disc bulge. No neural foraminal stenosis. No central canal stenosis.   L4-L5: Broad-based disc bulge with a small central annular fissure. No foraminal or central canal stenosis.   L5-S1: No significant disc bulge. No neural foraminal stenosis. No central canal stenosis.   IMPRESSION: 1. At L4-5 there is a broad-based disc bulge with a small central annular fissure. No evidence of nerve root impingement. 2. No acute osseous injury of the lumbar spine.     Electronically Signed   By: Elige Ko M.D.   On: 11/30/2022 11:10  Narrative & Impression  CLINICAL DATA:  Lumbar radiculitis.   EXAM: LUMBAR SPINE - 2-3 VIEW   COMPARISON:  None Available.   FINDINGS: There are 5 non-rib-bearing lumbar vertebra. Normal alignment. Normal vertebral body heights. Slight L5-S1 disc space narrowing, remaining disc spaces are preserved. Minor L4-L5 and L5-S1 facet hypertrophy. There is no evidence of fracture, focal bone lesion or pars defects. The sacroiliac joints are congruent.   IMPRESSION: 1. Slight L5-S1 disc space narrowing. 2. Minor lower lumbar facet hypertrophy.     Electronically Signed   By: Narda Rutherford M.D.   On: 06/20/2022 23:15    Past Medical/Family/Surgical/Social History: Medications & Allergies reviewed per EMR, new medications updated. 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   Raynaud's phenomenon 06/18/2022   Rosacea 03/05/2019   Anxiety 03/05/2019   Past Medical History:  Diagnosis Date   Anal fissure    Anxiety    Cardiac arrhythmia    during chidlhood   Depression    Eczema    Nipple pain x's 2 months   Rosacea    Urticaria    Family History  Problem Relation Age of Onset   Urticaria Mother    Eczema Mother    Allergic rhinitis Mother    Diabetes Mother    Colon polyps Mother    Irritable bowel syndrome Mother    Hyperlipidemia Mother    Depression Father    Anxiety disorder Father    BRCA 1/2 Neg Hx    Breast cancer Neg Hx    Past Surgical History:  Procedure Laterality Date   AUGMENTATION MAMMAPLASTY Bilateral    breast enhancement & removal   BREAST IMPLANT REMOVAL Bilateral    REFRACTIVE SURGERY Bilateral    Social History   Occupational History   Not on file  Tobacco Use   Smoking status: Never    Passive exposure: Never   Smokeless tobacco: Never  Vaping Use   Vaping status: Never Used  Substance and Sexual Activity   Alcohol use: Not Currently   Drug use: Never   Sexual activity: Yes    Partners: Male    Birth control/protection: OCP

## 2023-08-07 LAB — TESTOS,TOTAL,FREE AND SHBG (FEMALE)
Free Testosterone: 0.6 pg/mL (ref 0.1–6.4)
Sex Hormone Binding: 229.9 nmol/L — ABNORMAL HIGH (ref 17–124)
Testosterone, Total, LC-MS-MS: 12 ng/dL (ref 2–45)

## 2023-08-07 LAB — ESTRADIOL, FREE
Estradiol, Free: <0.03 pg/mL
Estradiol: <2 pg/mL

## 2023-08-07 LAB — FOLLICLE STIMULATING HORMONE: FSH: <0.7 m[IU]/mL — ABNORMAL LOW

## 2023-08-08 ENCOUNTER — Encounter: Payer: Self-pay | Admitting: Podiatry

## 2023-08-08 ENCOUNTER — Ambulatory Visit: Payer: Managed Care, Other (non HMO) | Admitting: Podiatry

## 2023-08-08 ENCOUNTER — Other Ambulatory Visit: Payer: Self-pay | Admitting: Internal Medicine

## 2023-08-08 DIAGNOSIS — M79671 Pain in right foot: Secondary | ICD-10-CM

## 2023-08-08 DIAGNOSIS — M7751 Other enthesopathy of right foot: Secondary | ICD-10-CM

## 2023-08-08 DIAGNOSIS — L700 Acne vulgaris: Secondary | ICD-10-CM

## 2023-08-08 MED ORDER — DEXAMETHASONE SODIUM PHOSPHATE 120 MG/30ML IJ SOLN
4.0000 mg | Freq: Once | INTRAMUSCULAR | Status: AC
  Administered 2023-08-08: 4 mg via INTRA_ARTICULAR

## 2023-08-08 MED ORDER — TRIAMCINOLONE ACETONIDE 10 MG/ML IJ SUSP
2.5000 mg | Freq: Once | INTRAMUSCULAR | Status: AC
  Administered 2023-08-08: 2.5 mg via INTRA_ARTICULAR

## 2023-08-08 NOTE — Progress Notes (Signed)
  Subjective:  Patient ID: Marie Hanson, female    DOB: 1980-12-28,   MRN: [id number]  No chief complaint on file.   42 y.o. female presents for concenr of continued pain in right ankle and now some pain over the bunion area. Relates numbness burning and tingling. Has seen ortho for hip pain and concern for radiculopathy and getting NCV. Denies any other pedal complaints. Denies n/v/f/c.   Past Medical History:  Diagnosis Date   Anal fissure    Anxiety    Cardiac arrhythmia    during chidlhood   Depression    Eczema    Nipple pain x's 2 months   Rosacea    Urticaria     Objective:  Physical Exam: Vascular: DP/PT pulses 2/4 bilateral. CFT <3 seconds. Normal hair growth on digits. No edema.  Skin. No lacerations or abrasions bilateral feet.  Musculoskeletal: MMT 5/5 bilateral lower extremities in DF, PF, Inversion and Eversion. Deceased ROM in DF of ankle joint. Tender to central calcaneal tubercle upon palpation. No pain along PT achilles or in arch. Tenderness to anterior ankle joint line. No pain with MMT.  Neurological: Sensation intact to light touch.   Assessment:   1. Capsulitis of right ankle   2. Right foot pain        Plan:  Patient was evaluated and treated and all questions answered. X-rays reviewed and discussed with patient. No acute fractures or dislocations noted.  No acute fractures or dislocations. Mild haglunds deformity and small os tibial externum noted.  Reviewed notes from sports medicine Discussed plantar fasciitis with patient.  Appears that some of her plantar foot pain could be coming from this. Some of her pain is likely lumbar radiculopathy. The ankle pain is unclear and could be some nerve irritation vs some capsultisi of the ankle.  Discussed treatment options including, ice, NSAIDS, supportive shoes, bracing, and stretching. Continue stretching and bracing.  Continue CMO.  Injeciton offered today. Procedure below.  Awaiting  NCV Follow-up as needed.   Procedure: Injection Tendon/Ligament Discussed alternatives, risks, complications and verbal consent was obtained.  Location: Right sinus tarsi. Skin Prep: Alcohol. Injectate: 1cc 0.5% marcaine plain, 1 cc dexamethasone 0.5 cc kenalog  Disposition: Patient tolerated procedure well. Injection site dressed with a band-aid.  Post-injection care was discussed and return precautions discussed.    Louann Sjogren, DPM

## 2023-08-09 ENCOUNTER — Telehealth: Payer: Self-pay

## 2023-08-09 NOTE — Telephone Encounter (Signed)
Patient called and left a message - After the injection yesterday, her foot Lynnda Child is very stiff, had trouble walking last night - and it is very tingly and numb feeling - is this normal?

## 2023-08-10 ENCOUNTER — Other Ambulatory Visit: Payer: Self-pay | Admitting: Family

## 2023-08-13 ENCOUNTER — Encounter: Payer: Self-pay | Admitting: Obstetrics and Gynecology

## 2023-08-13 ENCOUNTER — Other Ambulatory Visit: Payer: Self-pay | Admitting: Obstetrics and Gynecology

## 2023-08-13 ENCOUNTER — Ambulatory Visit: Payer: Managed Care, Other (non HMO) | Admitting: Obstetrics and Gynecology

## 2023-08-13 VITALS — BP 116/74 | HR 78 | Wt 111.0 lb

## 2023-08-13 DIAGNOSIS — A609 Anogenital herpesviral infection, unspecified: Secondary | ICD-10-CM | POA: Insufficient documentation

## 2023-08-13 DIAGNOSIS — R300 Dysuria: Secondary | ICD-10-CM | POA: Diagnosis not present

## 2023-08-13 DIAGNOSIS — R232 Flushing: Secondary | ICD-10-CM

## 2023-08-13 DIAGNOSIS — N898 Other specified noninflammatory disorders of vagina: Secondary | ICD-10-CM

## 2023-08-13 LAB — WET PREP FOR TRICH, YEAST, CLUE

## 2023-08-13 MED ORDER — ESTRADIOL 0.1 MG/GM VA CREA: TOPICAL_CREAM | VAGINAL | 3 refills | Status: DC

## 2023-08-13 MED ORDER — VALACYCLOVIR HCL 500 MG PO TABS: 500.0000 mg | ORAL_TABLET | Freq: Two times a day (BID) | ORAL | 3 refills | Status: AC

## 2023-08-13 MED ORDER — NORGESTIMATE-ETH ESTRADIOL 0.18/0.215/0.25 MG-25 MCG PO TABS: 1.0000 | ORAL_TABLET | Freq: Every day | ORAL | 3 refills | Status: DC

## 2023-08-13 MED ORDER — ESTRADIOL 0.1 MG/GM VA CREA: TOPICAL_CREAM | VAGINAL | 1 refills | Status: DC

## 2023-08-13 NOTE — Progress Notes (Signed)
 42 y.o. G0P0000 female with acne and hot flashes on COC here for results review. Married.  At last appointment she noted: Patient reports being diagnosed with COVID last December and has noticed multiple change since. Pt c/o vaginal itching and irritation. Hx of BV over the past two months. Using boric acid daily. Also notes food allergies now requiring a stricter diet. More irritable, acne (chronic). Having daily hot flashes in afternoons. Also worried about PCOS. Normal cycles with the pill. Reports that her mother went through menopause in her later 67s. PHQ-9 > 6 today, hx of anxiety and depression requiring medical therapy, now currently in therapy x1 yr.  Today she notes ongoing vaginal irritation. Last used boric acid on Thursday.  Noticing an odor.  Patient's last menstrual period was 07/05/2023.    Birth control: COC  OB History  Gravida Para Term Preterm AB Living  0 0 0 0 0 0  SAB IAB Ectopic Multiple Live Births  0 0 0 0 0    Past Medical History:  Diagnosis Date   Anal fissure    Anxiety    Cardiac arrhythmia    during chidlhood   Depression    Eczema    Nipple pain x's 2 months   Rosacea    Urticaria     Past Surgical History:  Procedure Laterality Date   AUGMENTATION MAMMAPLASTY Bilateral    breast enhancement & removal   BREAST IMPLANT REMOVAL Bilateral    REFRACTIVE SURGERY Bilateral     Current Outpatient Medications on File Prior to Visit  Medication Sig Dispense Refill   Adapalene 0.3 % gel Apply 1 Application topically at bedtime.     Azelaic Acid  15 % gel AFTER SKIN IS THOROUGHLY WASHED AND PATTED DRY, GENTLY AND THOROUGHLY APPLY A THIN FILM TO AFFECTED AREA IN THE MORNING AND AT NIGHT 50 g 2   cromolyn  (GASTROCROM ) 100 MG/5ML solution Take 200 mg by mouth 4 (four) times daily.     EPINEPHrine  0.3 mg/0.3 mL IJ SOAJ injection      fexofenadine (ALLEGRA) 180 MG tablet Take 180 mg by mouth daily.     metroNIDAZOLE (METROGEL) 0.75 % gel  Apply 1 Application topically daily.     MIEBO 1.338 GM/ML SOLN Apply to eye.     montelukast  (SINGULAIR ) 10 MG tablet TAKE 1 TABLET BY MOUTH EVERYDAY AT BEDTIME 90 tablet 0   Multiple Vitamin (MULTIVITAMIN) tablet Take 1 tablet by mouth daily.     RESTASIS 0.05 % ophthalmic emulsion      spironolactone  (ALDACTONE ) 50 MG tablet Take 2.5 tablets (125 mg total) by mouth daily. 90 tablet 4   WINLEVI 1 % CREA SMARTSIG:sparingly Topical Twice Daily     Current Facility-Administered Medications on File Prior to Visit  Medication Dose Route Frequency Provider Last Rate Last Admin   omalizumab  (XOLAIR ) prefilled syringe 300 mg  300 mg Subcutaneous Q28 days Marinda Rocky SAILOR, MD   300 mg at 05/14/23 1025    Allergies  Allergen Reactions   Benzoyl Peroxide Dermatitis   Iodine       PE Today's Vitals   08/13/23 0913  BP: 116/74  Pulse: 78  SpO2: 98%  Weight: 111 lb (50.3 kg)   Body mass index is 19.66 kg/m.  Physical Exam Vitals reviewed. Exam conducted with a chaperone present.  Constitutional:      General: She is not in acute distress.    Appearance: Normal appearance.  HENT:     Head: Normocephalic  and atraumatic.     Nose: Nose normal.  Eyes:     Extraocular Movements: Extraocular movements intact.     Conjunctiva/sclera: Conjunctivae normal.  Pulmonary:     Effort: Pulmonary effort is normal.  Genitourinary:    General: Normal vulva.     Exam position: Lithotomy position.     Vagina: Normal. No vaginal discharge.     Cervix: Normal. No cervical motion tenderness, discharge or lesion.     Uterus: Normal. Not enlarged and not tender.      Adnexa: Right adnexa normal and left adnexa normal.  Musculoskeletal:        General: Normal range of motion.     Cervical back: Normal range of motion.  Neurological:     General: No focal deficit present.     Mental Status: She is alert.  Psychiatric:        Mood and Affect: Mood normal.        Behavior: Behavior normal.       Assessment and Plan:        Hot flashes Assessment & Plan: Patient with family history of early menopause.  Patient's mother went through menopause in her late 53s. FSH and estradiol  levels below however results are likely affected by oral contraceptive pills.  However AMH is also low. Discussed with patient my concern for premature ovarian failure, however patient could also be presenting with perimenopausal symptoms. Diagnosis of POI cannot be confirmed on COC's so we discussed discontinuing for a 48-month window to reassess labs. However patient is very concerned about return of acne.  She declines trial of hormonal free window with repeat FSH and estradiol  testing. Maintain suspicion however will continue treatment with COC at this time. Will also add vaginal estrogen given ongoing chronic vaginitis with normal microbiology testing. Will increase estrogen dose to Triloan Millie which is 25 mcg and follow-up in 3 months. If symptoms do not improve then we can increase to Tri-Sprintec  which is a 35 mcg COC. Patient may also need to consider continuous dosing however currently on a triphasic tablet.  Orders: -     Norgestimate -Eth Estradiol ; Take 1 tablet by mouth daily.  Dispense: 84 tablet; Refill: 3  Burning with urination -     Urinalysis,Complete w/RFL Culture -     SureSwab Advanced Vaginitis Plus,TMA  Vaginal odor -     WET PREP FOR TRICH, YEAST, CLUE -     SureSwab Advanced Vaginitis Plus,TMA  Vaginal irritation -     SureSwab Advanced Vaginitis Plus,TMA -     Estradiol ; Apply 1/2 gram to vulva nightly for 2 weeks then decrease to 1/2 gram to vulva two nights a week.  Dispense: 20 g; Refill: 3  Anogenital HSV infection -     valACYclovir  HCl; Take 1 tablet (500 mg total) by mouth 2 (two) times daily for 3 days. With outbreaks  Dispense: 30 tablet; Refill: 3  Other orders -     Urine Culture -     REFLEXIVE URINE CULTURE   Vera LULLA Pa, MD

## 2023-08-13 NOTE — Telephone Encounter (Signed)
Refer to mychart encounter dated 08/13/2023.   Change in rx denied.   Routing to provider for final review.

## 2023-08-13 NOTE — Telephone Encounter (Signed)
 Msg read by pt today at 12:11pm.  VM received at 1:05pm stating that pharmacy is telling her that they still havent received rxs.  Spoke w/ pharmacy and inquired if rxs received.  Pharmacy said that rxs were received. However, the BCPs were transferred by pt to another pharmacy which she states is getting ready for her now.   However, stated that insurance did not want to cover pt's rx for estrace  cream since there was only 2 tubes sent total with rx and insurance is wanting a 90day supply sent for coverage.  I inquired if insurance's dont consider the sig on this cream since with directions, one tube should be at least 3months, if not more.   They reported that they are going attempt to adjust it for insurance to cover.   Pt notified of contact w/ the pharmacy per DVM per DPR.  Encounter routed to provider for final review.

## 2023-08-14 DIAGNOSIS — Q796 Ehlers-Danlos syndrome, unspecified: Secondary | ICD-10-CM

## 2023-08-14 HISTORY — DX: Ehlers-Danlos syndrome, unspecified: Q79.60

## 2023-08-14 HISTORY — PX: CYST REMOVAL HAND: SHX6279

## 2023-08-14 LAB — SURESWAB® ADVANCED VAGINITIS PLUS,TMA
C. trachomatis RNA, TMA: NOT DETECTED
CANDIDA SPECIES: NOT DETECTED
Candida glabrata: NOT DETECTED
N. gonorrhoeae RNA, TMA: NOT DETECTED
SURESWAB(R) ADV BACTERIAL VAGINOSIS(BV),TMA: NEGATIVE
TRICHOMONAS VAGINALIS (TV),TMA: NOT DETECTED

## 2023-08-15 LAB — URINE CULTURE
MICRO NUMBER:: 15905084
Result:: NO GROWTH
SPECIMEN QUALITY:: ADEQUATE

## 2023-08-15 LAB — URINALYSIS, COMPLETE W/RFL CULTURE
Bacteria, UA: NONE SEEN /HPF
Bilirubin Urine: NEGATIVE
Casts: NONE SEEN /LPF
Crystals: NONE SEEN /HPF
Glucose, UA: NEGATIVE
Ketones, ur: NEGATIVE
Leukocyte Esterase: NEGATIVE
Nitrites, Initial: NEGATIVE
Protein, ur: NEGATIVE
Specific Gravity, Urine: 1.004 (ref 1.001–1.035)
WBC, UA: NONE SEEN /HPF (ref 0–5)
Yeast: NONE SEEN /HPF
pH: 6 (ref 5.0–8.0)

## 2023-08-15 LAB — CULTURE INDICATED

## 2023-08-15 NOTE — Telephone Encounter (Signed)
 Per GH: "New Rx sent for 20 g."

## 2023-08-16 ENCOUNTER — Other Ambulatory Visit: Payer: Self-pay | Admitting: Obstetrics and Gynecology

## 2023-08-16 DIAGNOSIS — A609 Anogenital herpesviral infection, unspecified: Secondary | ICD-10-CM

## 2023-08-16 MED ORDER — ACYCLOVIR 5 % EX OINT: 1.0000 | TOPICAL_OINTMENT | CUTANEOUS | 3 refills | Status: AC

## 2023-08-19 ENCOUNTER — Ambulatory Visit: Payer: Managed Care, Other (non HMO)

## 2023-08-19 NOTE — Telephone Encounter (Signed)
 Med refill request: Valtrex  Last AEX: 09/05/2020 Next AEX: not scheduled. Will send message to front desk to schedule annual visit. Last MMG (if hormonal med) Refill authorized: Last Rx sent #[phone] with 3 refills on 08/13/2023. Please approve or deny as appropriate.

## 2023-08-22 ENCOUNTER — Encounter: Payer: Self-pay | Admitting: Obstetrics and Gynecology

## 2023-08-22 NOTE — Assessment & Plan Note (Signed)
 Patient with family history of early menopause.  Patient's mother went through menopause in her late 20s. FSH and estradiol  levels below however results are likely affected by oral contraceptive pills.  However AMH is also low. Discussed with patient my concern for premature ovarian failure, however patient could also be presenting with perimenopausal symptoms. Diagnosis of POI cannot be confirmed on COC's so we discussed discontinuing for a 6-month window to reassess labs. However patient is very concerned about return of acne.  She declines trial of hormonal free window with repeat FSH and estradiol  testing. Maintain suspicion however will continue treatment with COC at this time. Will also add vaginal estrogen given ongoing chronic vaginitis with normal microbiology testing. Will increase estrogen dose to Triloan Millie which is 25 mcg and follow-up in 3 months. If symptoms do not improve then we can increase to Tri-Sprintec  which is a 35 mcg COC. Patient may also need to consider continuous dosing however currently on a triphasic tablet.

## 2023-08-23 ENCOUNTER — Ambulatory Visit (INDEPENDENT_AMBULATORY_CARE_PROVIDER_SITE_OTHER): Payer: Managed Care, Other (non HMO)

## 2023-08-23 ENCOUNTER — Ambulatory Visit: Payer: Managed Care, Other (non HMO) | Admitting: Internal Medicine

## 2023-08-23 ENCOUNTER — Ambulatory Visit (INDEPENDENT_AMBULATORY_CARE_PROVIDER_SITE_OTHER): Payer: Managed Care, Other (non HMO) | Admitting: Podiatry

## 2023-08-23 ENCOUNTER — Encounter: Payer: Self-pay | Admitting: Internal Medicine

## 2023-08-23 ENCOUNTER — Encounter: Payer: Self-pay | Admitting: Podiatry

## 2023-08-23 DIAGNOSIS — M778 Other enthesopathies, not elsewhere classified: Secondary | ICD-10-CM

## 2023-08-23 DIAGNOSIS — M7751 Other enthesopathy of right foot: Secondary | ICD-10-CM | POA: Diagnosis not present

## 2023-08-23 DIAGNOSIS — M79671 Pain in right foot: Secondary | ICD-10-CM | POA: Diagnosis not present

## 2023-08-23 DIAGNOSIS — G5761 Lesion of plantar nerve, right lower limb: Secondary | ICD-10-CM | POA: Diagnosis not present

## 2023-08-23 DIAGNOSIS — M2011 Hallux valgus (acquired), right foot: Secondary | ICD-10-CM | POA: Diagnosis not present

## 2023-08-23 MED ORDER — MELOXICAM 15 MG PO TABS: 15.0000 mg | ORAL_TABLET | Freq: Every day | ORAL | 0 refills | Status: DC

## 2023-08-23 NOTE — Progress Notes (Signed)
  Subjective:  Patient ID: Marie Hanson, female    DOB: 12/11/1980,   MRN: [id number]  No chief complaint on file.   43 y.o. female presents for concenr of continued pain in right ankle and now some pain over the bunion area. Relates worsening and flare of pain after injection. It did get better but now has more stiffness in the ankle and getting more pain around first and second toes. Relates burning and tingling irritation.  Has seen ortho for hip pain and concern for radiculopathy and getting NCV. Denies any other pedal complaints. Denies n/v/f/c.   Past Medical History:  Diagnosis Date   Anal fissure    Anxiety    Cardiac arrhythmia    during chidlhood   Depression    Eczema    Nipple pain x's 2 months   Rosacea    Urticaria     Objective:  Physical Exam: Vascular: DP/PT pulses 2/4 bilateral. CFT <3 seconds. Normal hair growth on digits. No edema.  Skin. No lacerations or abrasions bilateral feet.  Musculoskeletal: MMT 5/5 bilateral lower extremities in DF, PF, Inversion and Eversion. Deceased ROM in DF of ankle joint. Minimally tender to central calcaneal tubercle upon palpation. No pain along PT achilles or in arch. Minimal tenderness to anterior ankle joint line. No pain with MMT. Most pain today is on dorsum of first interspace area and plantar first MPJ. No pain to medial eminence of bunion. No pain with ROM of the joint.  Neurological: Sensation intact to light touch.   Assessment:   1. Right foot pain   2. Capsulitis of right ankle   3. Capsulitis of foot   4. Morton's metatarsalgia, neuralgia, or neuroma, right         Plan:  Patient was evaluated and treated and all questions answered. X-rays reviewed and discussed with patient. No acute fractures or dislocations noted.  No acute fractures or dislocations. Mild haglunds deformity and small os tibial externum noted.  Reviewed notes from sports medicine Discussed plantar fasciitis with patient.  Appears  that some of her plantar foot pain could be coming from this. Some of her pain is likely lumbar radiculopathy. The ankle pain is unclear and could be some nerve irritation vs some capsulitis of the ankle. The pain in the foot now could be related to steroid flare and swelling vs possible neuroma vs from radiculopathy  Discussed treatment options including, ice, NSAIDS, supportive shoes, bracing, and stretching. Continue stretching and bracing. Will also try metatarsal pads.  Continue CMO.  Discussed likelihood of steroid flare and will avoid steroid injection. No concern for infection.  Discussed considering MRI to further evaluate any mechanical problems in the foot vs possible neuroma, neuritis.  Awaiting NCV Follow-up after MRI.        Asberry Failing, DPM

## 2023-08-26 ENCOUNTER — Encounter: Payer: Self-pay | Admitting: Obstetrics and Gynecology

## 2023-08-26 ENCOUNTER — Encounter: Payer: Managed Care, Other (non HMO) | Admitting: Physical Medicine and Rehabilitation

## 2023-08-26 DIAGNOSIS — L700 Acne vulgaris: Secondary | ICD-10-CM

## 2023-08-26 DIAGNOSIS — R232 Flushing: Secondary | ICD-10-CM

## 2023-08-27 NOTE — Telephone Encounter (Signed)
 Pt desires original rx.

## 2023-08-29 ENCOUNTER — Ambulatory Visit: Payer: Managed Care, Other (non HMO) | Admitting: Podiatry

## 2023-08-29 MED ORDER — YASMIN 28 3-0.03 MG PO TABS: 1.0000 | ORAL_TABLET | Freq: Every day | ORAL | 11 refills | Status: DC

## 2023-08-30 ENCOUNTER — Encounter: Payer: Self-pay | Admitting: Podiatry

## 2023-08-30 ENCOUNTER — Encounter: Payer: Self-pay | Admitting: Physical Medicine and Rehabilitation

## 2023-08-30 ENCOUNTER — Ambulatory Visit (INDEPENDENT_AMBULATORY_CARE_PROVIDER_SITE_OTHER): Payer: Managed Care, Other (non HMO) | Admitting: Physical Medicine and Rehabilitation

## 2023-08-30 DIAGNOSIS — M51369 Other intervertebral disc degeneration, lumbar region without mention of lumbar back pain or lower extremity pain: Secondary | ICD-10-CM | POA: Diagnosis not present

## 2023-08-30 DIAGNOSIS — M5416 Radiculopathy, lumbar region: Secondary | ICD-10-CM | POA: Diagnosis not present

## 2023-08-30 DIAGNOSIS — R202 Paresthesia of skin: Secondary | ICD-10-CM | POA: Diagnosis not present

## 2023-08-30 NOTE — Progress Notes (Unsigned)
Jaqlyn Mansaray - 43 y.o. female MRN [id number]  Date of birth: [birth year]  Office Visit Note: Visit Date: 08/30/2023 PCP: Philip Aspen, Limmie Patricia, MD Referred by: Philip Aspen, Estel*  Subjective: No chief complaint on file.  HPI: Alexza Kina is a 43 y.o. female who comes in today per the request of Dr. Madelyn Brunner for evaluation of chronic, worsening and severe numbness and soreness to bilateral buttocks radiating to right groin and down medial aspect of leg to foot to great toe. Symptoms ongoing for several years, worsens with movement and activity. She describes pain as tight, sore and aching sensation, currently rates as 3 out of 10. States right thigh region feels weak. She has tried formal physical therapy, home exercise regimen, rest and use of medications with minimal relief of pain. Lumbar MRI imaging from 2024 exhibits broad based disc bulge with small central annular fissure. No nerve impingement noted. She underwent L5-S1 interlaminar epidural steroid injection at [facility] on 12/11/2022, no relief of pain with this procedure. More recently she underwent right sinus tarsi injection on 08/08/2023, she reports worsened pain post injection. Of note, her symptoms started after recovering from COVID, she also reports pain to multiiple joints and issues with mass cell hypersensitivity. Dr. Shon Baton did voice concerns for possible EDS. Patient denies recent trauma or falls.      Review of Systems  Musculoskeletal:  Positive for back pain, joint pain and myalgias.  Neurological:  Positive for tingling and weakness.  All other systems reviewed and are negative.  Otherwise per HPI.  Assessment & Plan: Visit Diagnoses:    ICD-10-CM   1. Lumbar radiculopathy  M54.16     2. 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 Patient continues to have severe symptoms despite good conservative therapies such as formal physical therapy, home exercise regimen, rest and use of medications. Patients clinical presentation and exam are consistent with more L4 nerve pattern. There is broad based disc bulge and small central annular fissure at the level of L4-L5. We discussed treatment plan in detail today, next step is to perform right L4 transforaminal epidural steroid injection under fluoroscopic guidance. Patient voiced to me today that she does not wish to proceed with injection at this time due to issues with mast cell reaction following right ankle injection in December. She did inquire about possible nerve study of right lower extremity, diagnostically not sure nerve study would be very helpful. She is scheduled to follow up with podiatry in the coming weeks, plan is to have MRI imaging of right foot. I encouraged patient to let us know if she changes her mind about injection. Would recommend she follow up with Dr. Shon Baton for further evaluation. No red flag symptoms noted upon exam today.     Meds & Orders: No orders of the defined types were placed in this encounter.  No orders of the defined types were placed in this encounter.   Follow-up: No follow-ups on file.   Procedures: No procedures performed      Clinical History: CLINICAL DATA:  Low back pain for over 1 year. Pain radiates to bilateral legs.   EXAM: MRI LUMBAR SPINE WITHOUT CONTRAST   TECHNIQUE: Multiplanar, multisequence MR imaging of  the lumbar spine was performed. No intravenous contrast was administered.   COMPARISON:  None Available.   FINDINGS: Segmentation:  Standard.   Alignment:  Physiologic.   Vertebrae: No acute fracture, evidence of discitis, or aggressive bone lesion.   Conus medullaris and cauda equina: Conus extends to the T12-L1 level. Conus and cauda equina appear  normal.   Paraspinal and other soft tissues: No acute paraspinal abnormality.   Disc levels:   Disc spaces: Disc desiccation L4-5. Disc heights are relatively well maintained.   T12-L1: Mild broad-based disc bulge. No foraminal or central canal stenosis.   L1-L2: Minimal broad-based disc bulge. No foraminal or central canal stenosis.   L2-L3: No significant disc bulge. No neural foraminal stenosis. No central canal stenosis.   L3-L4: No significant disc bulge. No neural foraminal stenosis. No central canal stenosis.   L4-L5: Broad-based disc bulge with a small central annular fissure. No foraminal or central canal stenosis.   L5-S1: No significant disc bulge. No neural foraminal stenosis. No central canal stenosis.   IMPRESSION: 1. At L4-5 there is a broad-based disc bulge with a small central annular fissure. No evidence of nerve root impingement. 2. No acute osseous injury of the lumbar spine.     Electronically Signed   By: Elige Ko M.D.   On: 11/30/2022 11:10   She reports that she has never smoked. She has never been exposed to tobacco smoke. She has never used smokeless tobacco. No results for input(s): "HGBA1C", "LABURIC" in the last 8760 hours.  Objective:  VS:  HT:    WT:   BMI:     BP:   HR: bpm  TEMP: ( )  RESP:  Physical Exam Vitals and nursing note reviewed.  HENT:     Head: Normocephalic and atraumatic.     Right Ear: External ear normal.     Left Ear: External ear normal.     Nose: Nose normal.     Mouth/Throat:     Mouth: Mucous membranes are moist.  Eyes:     Extraocular Movements: Extraocular movements intact.  Cardiovascular:     Rate and Rhythm: Normal rate.     Pulses: Normal pulses.  Pulmonary:     Effort: Pulmonary effort is normal.  Abdominal:     General: Abdomen is flat. There is no distension.  Musculoskeletal:        General: Tenderness present.     Cervical back: Normal range of motion.     Comments: Patient rises  from seated position to standing without difficulty. Good lumbar range of motion. No pain noted with facet loading. 5/5 strength noted with bilateral hip flexion, knee flexion/extension, ankle dorsiflexion/plantarflexion and EHL. No clonus noted bilaterally. No pain upon palpation of greater trochanters. No pain with internal/external rotation of bilateral hips. Sensation intact bilaterally. Dysesthesias noted to right L4 dermatome. Negative slump test bilaterally. Ambulates without aid, gait steady.     Skin:    General: Skin is warm and dry.     Capillary Refill: Capillary refill takes less than 2 seconds.  Neurological:     General: No focal deficit present.     Mental Status: She is alert and oriented to person, place, and time.  Psychiatric:        Mood and Affect: Mood normal.        Behavior: Behavior normal.     Ortho Exam  Imaging: No results found.  Past Medical/Family/Surgical/Social History: Medications & Allergies reviewed per EMR, new medications  updated. 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   Raynaud's phenomenon 06/18/2022   Rosacea 03/05/2019   Anxiety 03/05/2019   Past Medical History:  Diagnosis Date   Anal fissure    Anxiety    Cardiac arrhythmia    during chidlhood   Depression    Eczema    Nipple pain x's 2 months   Rosacea    Urticaria    Family History  Problem Relation Age of Onset   Urticaria Mother    Eczema Mother    Allergic rhinitis Mother    Diabetes Mother    Colon polyps Mother    Irritable bowel syndrome Mother    Hyperlipidemia Mother    Depression Father    Anxiety disorder Father    BRCA 1/2 Neg Hx     Breast cancer Neg Hx    Past Surgical History:  Procedure Laterality Date   AUGMENTATION MAMMAPLASTY Bilateral    breast enhancement & removal   BREAST IMPLANT REMOVAL Bilateral    REFRACTIVE SURGERY Bilateral    Social History   Occupational History   Not on file  Tobacco Use   Smoking status: Never    Passive exposure: Never   Smokeless tobacco: Never  Vaping Use   Vaping status: Never Used  Substance and Sexual Activity   Alcohol use: Not Currently   Drug use: Never   Sexual activity: Yes    Partners: Male    Birth control/protection: OCP

## 2023-09-04 ENCOUNTER — Ambulatory Visit
Admission: RE | Admit: 2023-09-04 | Discharge: 2023-09-04 | Disposition: A | Discharge status: Home / Self Care | Payer: Managed Care, Other (non HMO) | Source: Ambulatory Visit | Attending: Internal Medicine | Admitting: Internal Medicine

## 2023-09-04 DIAGNOSIS — Z1231 Encounter for screening mammogram for malignant neoplasm of breast: Secondary | ICD-10-CM

## 2023-09-05 ENCOUNTER — Other Ambulatory Visit: Payer: Self-pay

## 2023-09-05 DIAGNOSIS — N898 Other specified noninflammatory disorders of vagina: Secondary | ICD-10-CM

## 2023-09-05 MED ORDER — ESTRADIOL 0.1 MG/GM VA CREA: TOPICAL_CREAM | VAGINAL | 3 refills | Status: DC

## 2023-09-05 NOTE — Telephone Encounter (Signed)
Pt requesting refill for estrace Last OV: 08/13/23 Next OV: 11/08/23

## 2023-09-07 ENCOUNTER — Ambulatory Visit
Admission: RE | Admit: 2023-09-07 | Discharge: 2023-09-07 | Disposition: A | Discharge status: Home / Self Care | Payer: Managed Care, Other (non HMO) | Source: Ambulatory Visit | Attending: Podiatry | Admitting: Podiatry

## 2023-09-07 DIAGNOSIS — M778 Other enthesopathies, not elsewhere classified: Secondary | ICD-10-CM

## 2023-09-07 DIAGNOSIS — G5761 Lesion of plantar nerve, right lower limb: Secondary | ICD-10-CM

## 2023-09-07 DIAGNOSIS — M7751 Other enthesopathy of right foot: Secondary | ICD-10-CM

## 2023-09-07 DIAGNOSIS — M79671 Pain in right foot: Secondary | ICD-10-CM

## 2023-09-12 ENCOUNTER — Ambulatory Visit: Payer: Managed Care, Other (non HMO) | Admitting: Family Medicine

## 2023-09-12 ENCOUNTER — Encounter: Payer: Self-pay | Admitting: Family Medicine

## 2023-09-12 VITALS — BP 120/78 | Ht 63.0 in | Wt 112.0 lb

## 2023-09-12 DIAGNOSIS — M25551 Pain in right hip: Secondary | ICD-10-CM

## 2023-09-12 MED ORDER — GABAPENTIN 100 MG PO CAPS: 100.0000 mg | ORAL_CAPSULE | Freq: Three times a day (TID) | ORAL | 3 refills | Status: DC | PRN

## 2023-09-12 NOTE — Progress Notes (Signed)
Rubin Payor, PhD, LAT, ATC acting as a scribe for [name], MD.  Marie Hanson is a 43 y.o. female who presents to [facility] at [facility] today for R leg pain. Pt was previous seen by Dr. Denyse Amass on 11/26/22 for lumbar radiculitis. Lumbar ESI on 12/11/22.  Today, pt c/o R leg pain never fully resolved. No relief from lumbar ESI. Pt locates pain to a "pinching" pain along the anterior aspect of her R hip, w/ numbness that runs along the anterior aspect of the whole R leg to Great toe. She notes having COVID in Dec 2023 and feels myalgias throughout her body.   She notes a significant clunk sensation that she can generated in her hip with certain motion.  This does cause the pain to radiate down her leg as above.  Dx imaging: 11/27/22 L-spine MRI 06/18/22 L-spine & pelvis XR  Pertinent review of systems: No fevers or chills.  Relevant historical information: Increased allergies following COVID.   Exam:  BP 120/78   Ht 5\' 3"  (1.6 m)   Wt 112 lb (50.8 kg)   LMP 08/09/2023   BMI 19.84 kg/m  General: Well Developed, well nourished, and in no acute distress.   MSK: Right hip normal-appearing normal motion.  Palpable clunk present with abduction extension and rotation loaded. Intact strength.    Lab and Radiology Results  X-ray images right hip obtained in December 2024 personally independently interpreted today. Mild calcification along acetabular rim could indicate a chronic labrum tear.  No acute fractures no severe DJD. Await formal radiology review.     Assessment and Plan: 43 y.o. female with persistent right leg pain.  This has been ongoing for over a year now.  I saw her for a very similar or same problem in April and obtained a lumbar spine MRI.  This showed the possibility of a pinched nerve at L4-L5 level with a bulging disc.  She had an epidural steroid injection which did not help all that much.  Her symptoms have persisted and she is  seeing a different sports medicine doctor and a podiatrist since.  She continues to have worsening pain in this region that is limiting her ability to exercise normally.  Differential includes a right hip labrum tear.  That would explain the clunk and clicking sensation and pain.  Plan for MRI arthrogram of the right hip.  This is a long-term ongoing problem.  She is already tried physical therapy and home exercises for this.  If the MRI arthrogram is not helpful next step would be nerve conduction study.  Prescribed gabapentin as well. PDMP not reviewed this encounter. Orders Placed This Encounter  Procedures   MR HIP RIGHT W CONTRAST    Standing Status:   Future    Expiration Date:   09/11/2024    Reason for Exam (SYMPTOM  OR DIAGNOSIS REQUIRED):   right hip pain    If indicated for the ordered procedure, I authorize the administration of contrast media per Radiology protocol:   Yes    What is the patient's sedation requirement?:   No Sedation    Does the patient have a pacemaker or implanted devices?:   No    Preferred imaging location?:   [facility] (table limit-550lbs)   DG FLUORO GUIDED NEEDLE PLC ASPIRATION/INJECTION LOC    Standing Status:   Future    Expiration Date:   09/11/2024    Lab orders requested (DO NOT place separate  lab orders, these will be automatically ordered during procedure specimen collection)::   None    Reason for Exam (SYMPTOM  OR DIAGNOSIS REQUIRED):   right hip pain    Is the patient pregnant?:   No    Preferred Imaging Location?:   [facility]   Meds ordered this encounter  Medications   gabapentin (NEURONTIN) 100 MG capsule    Sig: Take 1-3 capsules (100-300 mg total) by mouth 3 (three) times daily as needed.    Dispense:  90 capsule    Refill:  3     Discussed warning signs or symptoms. Please see discharge instructions. Patient expresses understanding.   The above documentation has been reviewed and is accurate and complete  [name], M.D.

## 2023-09-12 NOTE — Patient Instructions (Addendum)
Thank you for coming in today.   You should hear from MRI scheduling within 1 week. If you do not hear please let me know.    Check back once we get the MRI results

## 2023-09-15 ENCOUNTER — Other Ambulatory Visit: Payer: Self-pay | Admitting: Internal Medicine

## 2023-09-15 DIAGNOSIS — L700 Acne vulgaris: Secondary | ICD-10-CM

## 2023-09-18 ENCOUNTER — Encounter: Payer: Self-pay | Admitting: Dermatology

## 2023-09-18 DIAGNOSIS — L700 Acne vulgaris: Secondary | ICD-10-CM

## 2023-09-19 MED ORDER — SPIRONOLACTONE 50 MG PO TABS: 150.0000 mg | ORAL_TABLET | Freq: Every day | ORAL | 2 refills | Status: DC

## 2023-09-19 NOTE — Telephone Encounter (Signed)
 Yes, we can send rx for 50mg  and she will take 3 tablets daily.  Stop if she feels any light headedness of dizziness

## 2023-09-20 ENCOUNTER — Encounter: Payer: Self-pay | Admitting: Podiatry

## 2023-09-20 ENCOUNTER — Ambulatory Visit (INDEPENDENT_AMBULATORY_CARE_PROVIDER_SITE_OTHER): Payer: Managed Care, Other (non HMO) | Admitting: Podiatry

## 2023-09-20 DIAGNOSIS — M84374S Stress fracture, right foot, sequela: Secondary | ICD-10-CM | POA: Diagnosis not present

## 2023-09-20 NOTE — Progress Notes (Signed)
  Subjective:  Patient ID: Marie Hanson, female    DOB: [birth year],   MRN: [id number]  No chief complaint on file.   43 y.o. female presents for concenr of continued pain in right ankle and now some pain over the bunion area. Relates worsening and flare of pain after injection. It did get better but now has more stiffness in the ankle and getting more pain around first and second toes. Relates burning and tingling irritation.  Has seen ortho for hip pain and concern for radiculopathy and getting NCV. Denies any other pedal complaints. Denies n/v/f/c.   Past Medical History:  Diagnosis Date   Anal fissure    Anxiety    Cardiac arrhythmia    during chidlhood   Depression    Eczema    Nipple pain x's 2 months   Rosacea    Urticaria     Objective:  Physical Exam: Vascular: DP/PT pulses 2/4 bilateral. CFT <3 seconds. Normal hair growth on digits. No edema.  Skin. No lacerations or abrasions bilateral feet.  Musculoskeletal: MMT 5/5 bilateral lower extremities in DF, PF, Inversion and Eversion. Deceased ROM in DF of ankle joint. Minimally tender to central calcaneal tubercle upon palpation. No pain along PT achilles or in arch. Minimal tenderness to anterior ankle joint line. No pain with MMT. Most pain today is on dorsum of first interspace area and plantar first MPJ. No pain to medial eminence of bunion. No pain with ROM of the joint.  Neurological: Sensation intact to light touch.   MRI right foot  IMPRESSION: 1. Mild marrow edema along the plantar aspect of the second, third and fourth metatarsal heads concerning for stress reaction. 2. Mild marrow edema in the medial hallux sesamoid as can be seen with sesamoiditis.  Assessment:   1. Stress reaction of right foot, sequela          Plan:  Patient was evaluated and treated and all questions answered. X-rays reviewed and discussed with patient. No acute fractures or dislocations noted.  No acute fractures or  dislocations. Mild haglunds deformity and small os tibial externum noted.  Reviewed notes from sports medicine Reviewed results of MRI. Appears to be a stress reaction in the second third and fourth metatarsals. And some mild sesamoiditis. This accounts for her toe pain but unclear ankle pain Discussed ankle pain has been difficult to decipher. Discussed possible compensatory pain. Could consider MRI of ankle to hone in on area more but nothing seen from foot area.  Will offload for a period of four weeks in CAM boot to see if we can calm this down.  Awaiting NCV Follow-up in 4 weeks for recheck and x-ray        Asberry Failing, DPM

## 2023-09-22 ENCOUNTER — Other Ambulatory Visit: Payer: Self-pay | Admitting: Podiatry

## 2023-10-08 ENCOUNTER — Other Ambulatory Visit: Payer: Self-pay | Admitting: Internal Medicine

## 2023-10-09 ENCOUNTER — Other Ambulatory Visit: Payer: Self-pay | Admitting: Dermatology

## 2023-10-09 DIAGNOSIS — L700 Acne vulgaris: Secondary | ICD-10-CM

## 2023-10-14 ENCOUNTER — Telehealth: Payer: Self-pay

## 2023-10-14 NOTE — Telephone Encounter (Signed)
 Copied from CRM [id number]. Topic: Appointments - Transfer of Care >> Oct 14, 2023  9:12 AM Marie Hanson wrote: Pt is requesting to transfer FROM: [facility] Pt is requesting to transfer TO: [facility] Reason for requested transfer: States there was an incident in regard to her husbands directions on medication and is requesting to change providers.  It is the responsibility of the team the patient would like to transfer to Hyman Hopes, NP ) to reach out to the patient if for any reason this transfer is not acceptable.

## 2023-10-18 ENCOUNTER — Ambulatory Visit: Admitting: Internal Medicine

## 2023-10-18 ENCOUNTER — Ambulatory Visit: Payer: Managed Care, Other (non HMO) | Admitting: Podiatry

## 2023-10-20 ENCOUNTER — Emergency Department (HOSPITAL_BASED_OUTPATIENT_CLINIC_OR_DEPARTMENT_OTHER)
Admission: EM | Admit: 2023-10-20 | Discharge: 2023-10-20 | Disposition: A | Discharge status: Home / Self Care | Attending: Emergency Medicine | Admitting: Emergency Medicine

## 2023-10-20 ENCOUNTER — Other Ambulatory Visit: Payer: Self-pay

## 2023-10-20 ENCOUNTER — Encounter (HOSPITAL_BASED_OUTPATIENT_CLINIC_OR_DEPARTMENT_OTHER): Payer: Self-pay | Admitting: Emergency Medicine

## 2023-10-20 ENCOUNTER — Telehealth

## 2023-10-20 DIAGNOSIS — R110 Nausea: Secondary | ICD-10-CM | POA: Diagnosis not present

## 2023-10-20 DIAGNOSIS — R1030 Lower abdominal pain, unspecified: Secondary | ICD-10-CM | POA: Insufficient documentation

## 2023-10-20 LAB — URINALYSIS, ROUTINE W REFLEX MICROSCOPIC
Bilirubin Urine: NEGATIVE
Glucose, UA: NEGATIVE mg/dL
Ketones, ur: NEGATIVE mg/dL
Leukocytes,Ua: NEGATIVE
Nitrite: NEGATIVE
Protein, ur: NEGATIVE mg/dL
Specific Gravity, Urine: 1.01 (ref 1.005–1.030)
pH: 6 (ref 5.0–8.0)

## 2023-10-20 LAB — COMPREHENSIVE METABOLIC PANEL
ALT: 52 U/L — ABNORMAL HIGH (ref 0–44)
AST: 23 U/L (ref 15–41)
Albumin: 3.9 g/dL (ref 3.5–5.0)
Alkaline Phosphatase: 54 U/L (ref 38–126)
Anion gap: 9 (ref 5–15)
BUN: 28 mg/dL — ABNORMAL HIGH (ref 6–20)
CO2: 23 mmol/L (ref 22–32)
Calcium: 9.5 mg/dL (ref 8.9–10.3)
Chloride: 92 mmol/L — ABNORMAL LOW (ref 98–111)
Creatinine, Ser: 0.89 mg/dL (ref 0.44–1.00)
GFR, Estimated: >60 mL/min (ref >60)
Glucose, Bld: 127 mg/dL — ABNORMAL HIGH (ref 70–99)
Potassium: 4.6 mmol/L (ref 3.5–5.1)
Sodium: 124 mmol/L — ABNORMAL LOW (ref 135–145)
Total Bilirubin: 0.4 mg/dL (ref 0.0–1.2)
Total Protein: 8 g/dL (ref 6.5–8.1)

## 2023-10-20 LAB — CBC
HCT: 47.1 % — ABNORMAL HIGH (ref 36.0–46.0)
Hemoglobin: 16.2 g/dL — ABNORMAL HIGH (ref 12.0–15.0)
MCH: 30.6 pg (ref 26.0–34.0)
MCHC: 34.4 g/dL (ref 30.0–36.0)
MCV: 89 fL (ref 80.0–100.0)
Platelets: 217 10*3/uL (ref 150–400)
RBC: 5.29 MIL/uL — ABNORMAL HIGH (ref 3.87–5.11)
RDW: 12.8 % (ref 11.5–15.5)
WBC: 8 10*3/uL (ref 4.0–10.5)
nRBC: 0 % (ref 0.0–0.2)

## 2023-10-20 LAB — URINALYSIS, MICROSCOPIC (REFLEX)

## 2023-10-20 LAB — PREGNANCY, URINE: Preg Test, Ur: NEGATIVE

## 2023-10-20 LAB — LIPASE, BLOOD: Lipase: 60 U/L — ABNORMAL HIGH (ref 11–51)

## 2023-10-20 MED ORDER — FENTANYL CITRATE PF 50 MCG/ML IJ SOSY
25.0000 ug | PREFILLED_SYRINGE | Freq: Once | INTRAMUSCULAR | Status: AC
  Administered 2023-10-20: 25 ug via INTRAVENOUS
  Filled 2023-10-20: qty 1

## 2023-10-20 MED ORDER — DICYCLOMINE HCL 20 MG PO TABS: 20.0000 mg | ORAL_TABLET | Freq: Two times a day (BID) | ORAL | 0 refills | Status: DC

## 2023-10-20 MED ORDER — ONDANSETRON 4 MG PO TBDP
4.0000 mg | ORAL_TABLET | Freq: Three times a day (TID) | ORAL | 0 refills | Status: DC | PRN
Start: 2023-10-20 — End: 2023-10-25

## 2023-10-20 MED ORDER — LACTATED RINGERS IV BOLUS
1000.0000 mL | Freq: Once | INTRAVENOUS | Status: AC
  Administered 2023-10-20: 1000 mL via INTRAVENOUS

## 2023-10-20 MED ORDER — ONDANSETRON HCL 4 MG/2ML IJ SOLN
4.0000 mg | Freq: Once | INTRAMUSCULAR | Status: AC
  Administered 2023-10-20: 4 mg via INTRAVENOUS
  Filled 2023-10-20: qty 2

## 2023-10-20 MED ORDER — KETOROLAC TROMETHAMINE 30 MG/ML IJ SOLN
30.0000 mg | Freq: Once | INTRAMUSCULAR | Status: AC
  Administered 2023-10-20: 30 mg via INTRAVENOUS
  Filled 2023-10-20: qty 1

## 2023-10-20 NOTE — ED Notes (Signed)
 Pt called out for feeling cold, given blankets, walked to bathroom.

## 2023-10-20 NOTE — ED Notes (Signed)
 Discharge paperwork reviewed entirely with patient, including follow up care. Pain was under control. The patient received instruction and coaching on their prescriptions, and all follow-up questions were answered.  Pt verbalized understanding as well as all parties involved. No questions or concerns voiced at the time of discharge. No acute distress noted.   Pt ambulated out to PVA without incident or assistance.  Pt advised they will seek followup care with a specialist and followup with their PCP.   The pt was instructed to set up and/or review MyChart for their results; and was informed their Providers all have access to the information as well.

## 2023-10-20 NOTE — ED Provider Notes (Signed)
 [place] [facility] Provider Note   CSN: [id number] Arrival date & time: 10/20/23  7829     History  Chief Complaint  Patient presents with   Abdominal Pain    Marie Hanson is a 43 y.o. female with history of rosacea, anxiety, depression, eczema, who presents the emergency department complaining of lower abdominal pain.  Patient states that pain woke her up last night, feels sharp, and pain radiates somewhat to her perineum.  She has had some nausea, but without vomiting.  Denies any diarrhea or urinary symptoms.  She did have a bowel movement this morning and she felt a little constipated.  States that she tested positive for COVID-19 3 days ago, and is currently on Paxlovid.  She was told with starting Paxlovid that could have an interaction with her birth control and cause some breakthrough bleeding.  She has not had any bleeding.  She has not taken anything for her symptoms.    Abdominal Pain Associated symptoms: nausea        Home Medications Prior to Admission medications   Medication Sig Start Date End Date Taking? Authorizing Provider  dicyclomine (BENTYL) 20 MG tablet Take 1 tablet (20 mg total) by mouth 2 (two) times daily. 10/20/23  Yes Masiel Gentzler T, PA-C  ondansetron (ZOFRAN-ODT) 4 MG disintegrating tablet Take 1 tablet (4 mg total) by mouth every 8 (eight) hours as needed for nausea or vomiting. 10/20/23  Yes Bill Mcvey T, PA-C  Adapalene 0.3 % gel Apply 1 Application topically at bedtime. 05/18/23   [provider]  Azelaic Acid 15 % gel AFTER SKIN IS THOROUGHLY WASHED AND PATTED DRY, GENTLY AND THOROUGHLY APPLY A THIN FILM TO AFFECTED AREA IN THE MORNING AND AT NIGHT 10/08/23   Philip Aspen, Limmie Patricia, MD  EPINEPHrine 0.3 mg/0.3 mL IJ SOAJ injection  03/08/23   [provider]  estradiol (ESTRACE VAGINAL) 0.1 MG/GM vaginal cream Apply 1/2 gram to vulva nightly for 2 weeks then decrease to 1/2 gram  to vulva two nights a week. 09/05/23   Hines, Lennox Solders, MD  fexofenadine (ALLEGRA) 180 MG tablet Take 180 mg by mouth daily.    [provider]  gabapentin (NEURONTIN) 100 MG capsule Take 1-3 capsules (100-300 mg total) by mouth 3 (three) times daily as needed. 09/12/23   Rodolph Bong, MD  meloxicam (MOBIC) 15 MG tablet TAKE 1 TABLET (15 MG TOTAL) BY MOUTH DAILY. 09/23/23   Louann Sjogren, DPM  metroNIDAZOLE (METROGEL) 0.75 % gel Apply 1 Application topically daily. 05/18/23   [provider]  MIEBO 1.338 GM/ML SOLN Apply to eye. 03/01/23   [provider]  montelukast (SINGULAIR) 10 MG tablet TAKE 1 TABLET BY MOUTH EVERYDAY AT BEDTIME 08/12/23   Verlee Monte, MD  Multiple Vitamin (MULTIVITAMIN) tablet Take 1 tablet by mouth daily.    [provider]  RESTASIS 0.05 % ophthalmic emulsion  06/27/20   [provider]  spironolactone (ALDACTONE) 50 MG tablet TAKE 3 TABLETS BY MOUTH EVERY DAY 10/09/23   Terri Piedra, DO  YASMIN 28 3-0.03 MG tablet Take 1 tablet by mouth daily. 08/29/23   Rosalyn Gess, MD      Allergies    Benzoyl peroxide and Iodine    Review of Systems   Review of Systems  Gastrointestinal:  Positive for abdominal pain and nausea.  All other systems reviewed and are negative.   Physical Exam Updated Vital Signs BP 136/82  Pulse 81   Temp 97.7 F (36.5 C) (Oral)   Resp 18   Wt 51.7 kg   LMP 09/29/2023 (Approximate)   SpO2 100%   BMI 20.19 kg/m  Physical Exam Vitals and nursing note reviewed.  Constitutional:      Appearance: Normal appearance.  HENT:     Head: Normocephalic and atraumatic.  Eyes:     Conjunctiva/sclera: Conjunctivae normal.  Cardiovascular:     Rate and Rhythm: Normal rate and regular rhythm.  Pulmonary:     Effort: Pulmonary effort is normal. No respiratory distress.     Breath sounds: Normal breath sounds.  Abdominal:     General: There is no distension.     Palpations: Abdomen is  soft.     Tenderness: There is no abdominal tenderness.  Skin:    General: Skin is warm and dry.  Neurological:     General: No focal deficit present.     Mental Status: She is alert.     ED Results / Procedures / Treatments   Labs (all labs ordered are listed, but only abnormal results are displayed) Labs Reviewed  LIPASE, BLOOD - Abnormal; Notable for the following components:      Result Value   Lipase 60 (*)    All other components within normal limits  COMPREHENSIVE METABOLIC PANEL - Abnormal; Notable for the following components:   Sodium 124 (*)    Chloride 92 (*)    Glucose, Bld 127 (*)    BUN 28 (*)    ALT 52 (*)    All other components within normal limits  CBC - Abnormal; Notable for the following components:   RBC 5.29 (*)    Hemoglobin 16.2 (*)    HCT 47.1 (*)    All other components within normal limits  URINALYSIS, ROUTINE W REFLEX MICROSCOPIC - Abnormal; Notable for the following components:   Hgb urine dipstick TRACE (*)    All other components within normal limits  URINALYSIS, MICROSCOPIC (REFLEX) - Abnormal; Notable for the following components:   Bacteria, UA RARE (*)    All other components within normal limits  PREGNANCY, URINE    EKG None  Radiology No results found.  Procedures Procedures    Medications Ordered in ED Medications  fentaNYL (SUBLIMAZE) injection 25 mcg (25 mcg Intravenous Given 10/20/23 1037)  ketorolac (TORADOL) 30 MG/ML injection 30 mg (30 mg Intravenous Given 10/20/23 1056)  lactated ringers bolus 1,000 mL (0 mLs Intravenous Stopped 10/20/23 1144)  ondansetron (ZOFRAN) injection 4 mg (4 mg Intravenous Given 10/20/23 1055)    ED Course/ Medical Decision Making/ A&P                                 Medical Decision Making Amount and/or Complexity of Data Reviewed Labs: ordered.  This patient is a 43 y.o. female  who presents to the ED for concern of lower abdominal pain.   Differential diagnoses prior to  evaluation: The emergent differential diagnosis includes, but is not limited to,  PID, appendicitis, kidney stone, septic abortion, ruptured ovarian cyst, ovarian torsion, tubo-ovarian abscess, fibroids, endometriosis, diverticulitis, cystitis, ectopic pregnancy, dysmenorrhea, septic abortion. This is not an exhaustive differential.   Past Medical History / Co-morbidities / Social History: rosacea, anxiety, depression, eczema  Reviewed paxlovid drug interactions. Use of ethinyl estradiol is contraindicated due to decreased efficacy of contraceptive during paxlovid use. CDC recommends nonhormonal contraceptive during use of paxlovid  and until one menstrual cycle after. Pt instructed about this.   Physical Exam: Physical exam performed. The pertinent findings include: Mildly hypertensive, otherwise normal vital signs.  No acute distress.  Abdomen soft and overall nontender.  Lab Tests/Imaging studies: I personally interpreted labs/imaging and the pertinent results include:  normal WBC, hemoglobin 16.2 (likely hemoconcentrated), CMP grossly unremarkable, mildly elevated BUN and ALT. Lipase mildly elevated at 60. UA wit trace hemoglobin, rare bacteria. Pregnancy negative.  I considered ordering abdominal imaging, however patient has reassuring laboratory evaluation and nonsurgical abdominal exam, so will defer emergent imaging at this time.  Medications: I ordered medication including fentanyl, zofran, toradol, IVF.  I have reviewed the patients home medicines and have made adjustments as needed. On reevaluation, patient is feeling somewhat improved.    Disposition: After consideration of the diagnostic results and the patients response to treatment, I feel that emergency department workup does not suggest an emergent condition requiring admission or immediate intervention beyond what has been performed at this time. The plan is: Discharge to home.  Will send prescription for Zofran and Bentyl to the  pharmacy.  Suspect symptoms related to either COVID or side effect from Paxlovid.  Patient has concerns regarding history of histamine reactions after previously having COVID, and frequently has adverse effects to different medications.  I explained that we did give her Zofran through her IV and she has not had a reaction, so this should be safe for home.  She is willing to try Bentyl for abdominal cramping and see if she tolerates that.  She is given strict return precautions, and recommended to follow-up with her PCP and her immune specialists. The patient is safe for discharge and has been instructed to return immediately for worsening symptoms, change in symptoms or any other concerns.  Final Clinical Impression(s) / ED Diagnoses Final diagnoses:  Lower abdominal pain  Nausea    Rx / DC Orders ED Discharge Orders          Ordered    ondansetron (ZOFRAN-ODT) 4 MG disintegrating tablet  Every 8 hours PRN        10/20/23 1150    dicyclomine (BENTYL) 20 MG tablet  2 times daily        10/20/23 1150           Portions of this report may have been transcribed using voice recognition software. Every effort was made to ensure accuracy; however, inadvertent computerized transcription errors may be present.    Su Monks, PA-C 10/20/23 1152    Alvira Monday, MD 10/20/23 1235

## 2023-10-20 NOTE — ED Notes (Signed)
 Yasmin birth control allegedly has interaction with Paxlovid, PCP informed her. Taken 3 days of Paxlovid. Pt has diffuse pain across pubis, genitals, perineum and anus.

## 2023-10-20 NOTE — ED Triage Notes (Signed)
 Woke up last night with lower sharp pain , nausea , no emesis . Denies diarrhea or urinary symptoms .  Reports also she was positive for Covid x 3 days and on paxlovid .

## 2023-10-20 NOTE — Discharge Instructions (Addendum)
 You were seen in the emergency department today for abdominal pain.  As we discussed your lab work looked very reassuring.  It did look like you were slightly dehydrated, so we gave you IV fluids and nausea medication, as well as medicine for pain.    I suspect that your symptoms are likely a side effect of taking Paxlovid.  I reviewed the recommendations regarding your birth control and I do not think that this drug interaction will be causing pain, but the manufacturer does recommend using nonhormonal birth control while taking Paxlovid, and for 1 month after finishing it to avoid unplanned pregnancy.  I feel you can discontinue the Paxlovid if it is causing the side effects.  You can take over-the-counter medications to help with your symptoms related to COVID-19 such as ibuprofen/or Tylenol as needed for pain or fever.  I have prescribed you a nausea medication to your pharmacy.  Continue to monitor how you're doing and return to the ER for new or worsening symptoms.

## 2023-10-21 ENCOUNTER — Other Ambulatory Visit: Payer: Managed Care, Other (non HMO)

## 2023-10-21 ENCOUNTER — Ambulatory Visit: Payer: Managed Care, Other (non HMO) | Admitting: Dermatology

## 2023-10-22 ENCOUNTER — Other Ambulatory Visit: Payer: Self-pay

## 2023-10-22 ENCOUNTER — Emergency Department (HOSPITAL_BASED_OUTPATIENT_CLINIC_OR_DEPARTMENT_OTHER)
Admission: EM | Admit: 2023-10-22 | Discharge: 2023-10-22 | Disposition: A | Discharge status: Home / Self Care | Attending: Emergency Medicine | Admitting: Emergency Medicine

## 2023-10-22 ENCOUNTER — Encounter (HOSPITAL_BASED_OUTPATIENT_CLINIC_OR_DEPARTMENT_OTHER): Payer: Self-pay

## 2023-10-22 ENCOUNTER — Emergency Department (HOSPITAL_BASED_OUTPATIENT_CLINIC_OR_DEPARTMENT_OTHER)

## 2023-10-22 DIAGNOSIS — R110 Nausea: Secondary | ICD-10-CM | POA: Diagnosis not present

## 2023-10-22 DIAGNOSIS — R109 Unspecified abdominal pain: Secondary | ICD-10-CM | POA: Diagnosis not present

## 2023-10-22 DIAGNOSIS — R079 Chest pain, unspecified: Secondary | ICD-10-CM | POA: Diagnosis present

## 2023-10-22 DIAGNOSIS — E878 Other disorders of electrolyte and fluid balance, not elsewhere classified: Secondary | ICD-10-CM | POA: Insufficient documentation

## 2023-10-22 DIAGNOSIS — E871 Hypo-osmolality and hyponatremia: Secondary | ICD-10-CM | POA: Insufficient documentation

## 2023-10-22 DIAGNOSIS — Z8616 Personal history of COVID-19: Secondary | ICD-10-CM | POA: Diagnosis not present

## 2023-10-22 DIAGNOSIS — R0789 Other chest pain: Secondary | ICD-10-CM | POA: Diagnosis not present

## 2023-10-22 LAB — BASIC METABOLIC PANEL
Anion gap: 11 (ref 5–15)
BUN: 30 mg/dL — ABNORMAL HIGH (ref 6–20)
CO2: 23 mmol/L (ref 22–32)
Calcium: 9.7 mg/dL (ref 8.9–10.3)
Chloride: 93 mmol/L — ABNORMAL LOW (ref 98–111)
Creatinine, Ser: 1.08 mg/dL — ABNORMAL HIGH (ref 0.44–1.00)
GFR, Estimated: >60 mL/min (ref >60)
Glucose, Bld: 120 mg/dL — ABNORMAL HIGH (ref 70–99)
Potassium: 5 mmol/L (ref 3.5–5.1)
Sodium: 127 mmol/L — ABNORMAL LOW (ref 135–145)

## 2023-10-22 LAB — CBC
HCT: 47.2 % — ABNORMAL HIGH (ref 36.0–46.0)
Hemoglobin: 16.6 g/dL — ABNORMAL HIGH (ref 12.0–15.0)
MCH: 31.2 pg (ref 26.0–34.0)
MCHC: 35.2 g/dL (ref 30.0–36.0)
MCV: 88.7 fL (ref 80.0–100.0)
Platelets: 241 10*3/uL (ref 150–400)
RBC: 5.32 MIL/uL — ABNORMAL HIGH (ref 3.87–5.11)
RDW: 12.7 % (ref 11.5–15.5)
WBC: 8.5 10*3/uL (ref 4.0–10.5)
nRBC: 0 % (ref 0.0–0.2)

## 2023-10-22 LAB — PREGNANCY, URINE: Preg Test, Ur: NEGATIVE

## 2023-10-22 LAB — TROPONIN I (HIGH SENSITIVITY)
Troponin I (High Sensitivity): <2 ng/L (ref <18)
Troponin I (High Sensitivity): <2 ng/L (ref <18)

## 2023-10-22 MED ORDER — KETOROLAC TROMETHAMINE 30 MG/ML IJ SOLN
30.0000 mg | Freq: Once | INTRAMUSCULAR | Status: AC
  Administered 2023-10-22: 30 mg via INTRAVENOUS
  Filled 2023-10-22: qty 1

## 2023-10-22 MED ORDER — DICYCLOMINE HCL 20 MG PO TABS: 20.0000 mg | ORAL_TABLET | Freq: Two times a day (BID) | ORAL | 0 refills | Status: DC

## 2023-10-22 MED ORDER — ONDANSETRON HCL 4 MG PO TABS: 4.0000 mg | ORAL_TABLET | Freq: Three times a day (TID) | ORAL | 0 refills | Status: DC | PRN

## 2023-10-22 MED ORDER — SODIUM CHLORIDE 0.9 % IV BOLUS
1000.0000 mL | Freq: Once | INTRAVENOUS | Status: AC
  Administered 2023-10-22: 1000 mL via INTRAVENOUS

## 2023-10-22 NOTE — Discharge Instructions (Addendum)
 Today were seen for atypical chest pain, nausea, and abdominal cramping.  Please pick up your Bentyl and Zofran and take as needed.  You may also alternate taking Tylenol and Motrin every 4 hours as needed for pain.  Please maintain adequate fluid intake.  Thank you for letting us treat you today. After reviewing your labs and imaging, I feel you are safe to go home. Please follow up with your PCP in the next several days and provide them with your records from this visit. Return to the Emergency Room if pain becomes severe or symptoms worsen.

## 2023-10-22 NOTE — ED Provider Notes (Signed)
 [place] [facility] Provider Note   CSN: [id number] Arrival date & time: 10/22/23  4098     History  Chief Complaint  Patient presents with   Chest Pain    Marie Hanson is a 43 y.o. female past medical history significant for anxiety, chronic urticaria, COVID diagnosis on 3/6 (currently on Paxlovid) presents today for chest burning, gas, and abdominal cramping.  Patient states that she is not taking any over-the-counter pain relievers for her pain.  Patient did take a Pepcid and Gas-X which helped with her chest burning and gas.  Patient does endorse mild nausea and decreased appetite.  Patient denies shortness of breath, chest pain, weakness, vomiting, diarrhea, rectal bleeding, hematemesis, numbness, vision changes or any other complaints at this time.   Chest Pain Associated symptoms: abdominal pain, dizziness and palpitations        Home Medications Prior to Admission medications   Medication Sig Start Date End Date Taking? Authorizing Provider  dicyclomine (BENTYL) 20 MG tablet Take 1 tablet (20 mg total) by mouth 2 (two) times daily. 10/22/23  Yes Dolphus Jenny, PA-C  ondansetron (ZOFRAN) 4 MG tablet Take 1 tablet (4 mg total) by mouth every 8 (eight) hours as needed for nausea or vomiting. 10/22/23  Yes Dolphus Jenny, PA-C  Adapalene 0.3 % gel Apply 1 Application topically at bedtime. 05/18/23   [provider]  Azelaic Acid 15 % gel AFTER SKIN IS THOROUGHLY WASHED AND PATTED DRY, GENTLY AND THOROUGHLY APPLY A THIN FILM TO AFFECTED AREA IN THE MORNING AND AT NIGHT 10/08/23   Philip Aspen, Limmie Patricia, MD  EPINEPHrine 0.3 mg/0.3 mL IJ SOAJ injection  03/08/23   [provider]  estradiol (ESTRACE VAGINAL) 0.1 MG/GM vaginal cream Apply 1/2 gram to vulva nightly for 2 weeks then decrease to 1/2 gram to vulva two nights a week. 09/05/23   Hines, Lennox Solders, MD  fexofenadine (ALLEGRA) 180 MG tablet Take 180 mg by mouth  daily.    [provider]  gabapentin (NEURONTIN) 100 MG capsule Take 1-3 capsules (100-300 mg total) by mouth 3 (three) times daily as needed. 09/12/23   Rodolph Bong, MD  meloxicam (MOBIC) 15 MG tablet TAKE 1 TABLET (15 MG TOTAL) BY MOUTH DAILY. 09/23/23   Louann Sjogren, DPM  metroNIDAZOLE (METROGEL) 0.75 % gel Apply 1 Application topically daily. 05/18/23   [provider]  MIEBO 1.338 GM/ML SOLN Apply to eye. 03/01/23   [provider]  montelukast (SINGULAIR) 10 MG tablet TAKE 1 TABLET BY MOUTH EVERYDAY AT BEDTIME 08/12/23   Verlee Monte, MD  Multiple Vitamin (MULTIVITAMIN) tablet Take 1 tablet by mouth daily.    [provider]  ondansetron (ZOFRAN-ODT) 4 MG disintegrating tablet Take 1 tablet (4 mg total) by mouth every 8 (eight) hours as needed for nausea or vomiting. 10/20/23   Roemhildt, Lorin T, PA-C  RESTASIS 0.05 % ophthalmic emulsion  06/27/20   [provider]  spironolactone (ALDACTONE) 50 MG tablet TAKE 3 TABLETS BY MOUTH EVERY DAY 10/09/23   Terri Piedra, DO  YASMIN 28 3-0.03 MG tablet Take 1 tablet by mouth daily. 08/29/23   Rosalyn Gess, MD      Allergies    Benzoyl peroxide and Iodine    Review of Systems   Review of Systems  Cardiovascular:  Positive for chest pain and palpitations.  Gastrointestinal:  Positive for abdominal pain.  Neurological:  Positive for dizziness.  Physical Exam Updated Vital Signs BP 125/86 (BP Location: Left Arm)   Pulse 83   Temp (!) 97.1 F (36.2 C)   Resp 16   Wt 51.7 kg   LMP 10/01/2023 (Approximate)   SpO2 100%   BMI 20.19 kg/m  Physical Exam Vitals and nursing note reviewed.  Constitutional:      General: She is not in acute distress.    Appearance: She is well-developed and normal weight. She is not ill-appearing, toxic-appearing or diaphoretic.  HENT:     Head: Normocephalic and atraumatic.  Eyes:     Extraocular Movements: Extraocular movements intact.      Conjunctiva/sclera: Conjunctivae normal.  Cardiovascular:     Rate and Rhythm: Normal rate and regular rhythm.     Heart sounds: Normal heart sounds. No murmur heard. Pulmonary:     Effort: Pulmonary effort is normal. No tachypnea or respiratory distress.     Breath sounds: Normal breath sounds.  Abdominal:     General: Bowel sounds are normal.     Palpations: Abdomen is soft. There is no hepatomegaly or splenomegaly.     Tenderness: There is no abdominal tenderness. There is no guarding or rebound.  Musculoskeletal:        General: No swelling.     Cervical back: Neck supple.     Right lower leg: No edema.     Left lower leg: No edema.  Skin:    General: Skin is warm and dry.     Capillary Refill: Capillary refill takes less than 2 seconds.  Neurological:     General: No focal deficit present.     Mental Status: She is alert.  Psychiatric:        Mood and Affect: Mood normal.        Behavior: Behavior normal.     ED Results / Procedures / Treatments   Labs (all labs ordered are listed, but only abnormal results are displayed) Labs Reviewed  BASIC METABOLIC PANEL - Abnormal; Notable for the following components:      Result Value   Sodium 127 (*)    Chloride 93 (*)    Glucose, Bld 120 (*)    BUN 30 (*)    Creatinine, Ser 1.08 (*)    All other components within normal limits  CBC - Abnormal; Notable for the following components:   RBC 5.32 (*)    Hemoglobin 16.6 (*)    HCT 47.2 (*)    All other components within normal limits  PREGNANCY, URINE  TROPONIN I (HIGH SENSITIVITY)  TROPONIN I (HIGH SENSITIVITY)    EKG EKG Interpretation Date/Time:  Tuesday October 22 2023 07:23:30 EDT Ventricular Rate:  84 PR Interval:  127 QRS Duration:  79 QT Interval:  345 QTC Calculation: 408 R Axis:   78  Text Interpretation: Sinus rhythm Biatrial enlargement No old tracing to compare Confirmed by Melene Plan [phone]) on 10/22/2023 8:18:07 AM  Radiology DG Chest 2 View Result  Date: 10/22/2023 CLINICAL DATA:  Chest pain. EXAM: CHEST - 2 VIEW COMPARISON:  None Available. FINDINGS: Bilateral lung fields are clear. Bilateral costophrenic angles are clear. Normal cardio-mediastinal silhouette. No acute osseous abnormalities. The soft tissues are within normal limits. IMPRESSION: No active cardiopulmonary disease. Electronically Signed   By: Jules Schick M.D.   On: 10/22/2023 09:11    Procedures Procedures    Medications Ordered in ED Medications  sodium chloride 0.9 % bolus 1,000 mL (1,000 mLs Intravenous New Bag/Given 10/22/23 0950)  ketorolac (  TORADOL) 30 MG/ML injection 30 mg (30 mg Intravenous Given 10/22/23 8413)    ED Course/ Medical Decision Making/ A&P                                 Medical Decision Making Amount and/or Complexity of Data Reviewed Labs: ordered. Radiology: ordered.   This patient presents to the ED with chief complaint(s) of chest burning, palpitations, abdominal pain with pertinent past medical history of COVID, anxiety which further complicates the presenting complaint. The complaint involves an extensive differential diagnosis and also carries with it a high risk of complications and morbidity.    The differential diagnosis includes anxiety, COVID, STEMI, NSTEMI, electrolyte abnormality, arrhythmia, pancreatitis, appendicitis, ectopic pregnancy  Additional history obtained: Records reviewed Care Everywhere/External Records  ED Course and Reassessment: Patient given 1 L NS and Toradol Patient reassessed after fluids and Toradol and states that she is feeling much improved.  Independent labs interpretation:  The following labs were independently interpreted:  EKG: Sinus rhythm with biatrial enlargement BMP: Hyponatremia at 127 (improved from 124 on 3/9), hypochloremia 93, elevated bun at 30, elevated creatinine at 1.08 CBC: Elevated hemoglobin at 16.6 likely hemoconcentrated Urine pregnancy: Negative Troponin: <2,  <2  Independent visualization of imaging: - I independently visualized the following imaging with scope of interpretation limited to determining acute life threatening conditions related to emergency care: Chest x-ray, which revealed no active cardiopulmonary disease  Consultation: - Consulted or discussed management/test interpretation w/ external professional: None  Consideration for admission or further workup: Consider for admission or further workup however patient's vital signs, physical exam, labs, and imaging were reassuring.  Using shared decision making, discussed with patient risks versus benefits of CT abdomen pelvis with contrast and given labs and physical exam that this would likely not change plan of care, patient has decided to defer CT abdomen pelvis at this time.  Patient requested refill of Bentyl and Zofran, these were sent into her pharmacy.  Patient given strict return precautions and encouraged to increase fluid intake and take Tylenol and Motrin as needed for pain.        Final Clinical Impression(s) / ED Diagnoses Final diagnoses:  Atypical chest pain  Nausea  Abdominal cramping    Rx / DC Orders ED Discharge Orders          Ordered    ondansetron (ZOFRAN) 4 MG tablet  Every 8 hours PRN        10/22/23 0939    dicyclomine (BENTYL) 20 MG tablet  2 times daily        10/22/23 0939              Dolphus Jenny, PA-C 10/22/23 1054    Melene Plan, DO 10/22/23 1146

## 2023-10-22 NOTE — ED Triage Notes (Signed)
 States was here 2 days ago for abdominal cramping. States now feels burning in chest, palpitations, gas, dizziness and also continued abdominal cramping. Tested positive for covid on 3/6.

## 2023-10-22 NOTE — ED Notes (Signed)
Blue top tube sent to lab to hold

## 2023-10-24 ENCOUNTER — Other Ambulatory Visit: Payer: Self-pay | Admitting: Obstetrics and Gynecology

## 2023-10-24 ENCOUNTER — Telehealth: Payer: Self-pay | Admitting: *Deleted

## 2023-10-24 ENCOUNTER — Ambulatory Visit (INDEPENDENT_AMBULATORY_CARE_PROVIDER_SITE_OTHER)

## 2023-10-24 DIAGNOSIS — R102 Pelvic and perineal pain: Secondary | ICD-10-CM

## 2023-10-24 DIAGNOSIS — R1030 Lower abdominal pain, unspecified: Secondary | ICD-10-CM

## 2023-10-24 DIAGNOSIS — R109 Unspecified abdominal pain: Secondary | ICD-10-CM

## 2023-10-24 NOTE — Telephone Encounter (Signed)
 Front office scheduled patient for OV tomorrow at 0800 with Dr. Kennith Center, call then transferred to triage.   Spoke with patient. COVID positive 1 week ago. See in Urgent care on 3/9 and 3/11 for abdominal pain and cramping. Patient reports pain is currently 7 out of 10, lower abdomen into lower back. Pain not resolving with OTC Tylenol or Advil.   Reports Covid symptoms have resolved. Reports waves of nausea and no appetite continue, was prescribed Zofran for nausea. Bentyl also prescribed for cramping, not effective.   Denies urinary symptoms, vaginal odor or bleeding. UPT neg at both urgent care visits. Denies fever/chills, vomiting, chest pain. Was constipated several days ago, this has resolved, bowel movement this morning. LMP approximately 3 weeks ago, OCP for contraceptive. Patient states she was advised to f/u with GYN.   No PUS or CT completed. Patient states labs indicated she was dehydrated.   Patient is s/p hand surgery 10/03/23, has a virtual visit scheduled for post-op today between 11am -12PM.   Advised patient to keep appt as scheduled with Dr. Kennith Center. I will send to Dr. Kennith Center to review and f/u with any additional recommendations. Patient agreeable.   Dr. Kennith Center -please review.

## 2023-10-24 NOTE — Telephone Encounter (Signed)
 Spoke with patient. PUS scheduled for today at 1315, arriving at 1300 for check-in. Advised patient to keep OV as scheduled for now on 3/14 with Dr. Kennith Center. Advised I will return call if she can be worked into Dr. Kennith Center schedule earlier. Patient agreeable and appreciative of call.   Front office notified.   Routing to provider for final review. Patient is agreeable to disposition. Will close encounter.

## 2023-10-24 NOTE — Progress Notes (Unsigned)
 Chief Complaint: Follow-up Primary GI Doctor:Dr. Leonides Schanz  HPI: 43 year old female with history of eczema, anxiety, and depression presents with food intolerance.  Patient has been experiencing flushing, hives, and diarrhea since 08/2022.  Acne has been worse as well.  She was treated with allergy shots since her symptoms seem to worsen slightly.  She does find that Xolair seems to help with her symptoms.  Since she has been following with the low histamine diet for the last 4 months, she does think that her symptoms overall have improved.  On average she has 1 stool per day.  Denies any nocturnal stools.  Denies blood in the stools.  Endorses abdominal cramping in the lower abdomen that will just go away on its own with time.  Endorses nausea.  Denies vomiting.  Denies dysphagia or acid reflux.  She is on doxycycline for dry eyes which also seems to help with her acne as well.  She went to a holistic provider who performed a panel of labs that showed a negative GI pathogen panel, moderately elevated fecal calprotectin, and mildly low fecal elastase.  Patient denies any history of pancreatitis.   Lab work completed: C4, C1 esterase, serum 5-HIAA- all negative (r/o abdominal angioedema ) urine porphobilinogen-normal (r/o  porphyria ) Vitamin B 12, folate- normal, iron slightly low at 78, ferritin 44.5- encouraged multivitamin with iron fecal calprotectin-19 fecal elastase-303 07/02/23 Sibo test negative  Interval History     Patient presents for follow-up .  She states she recently was diagnosed with Covid  and started on Paxlovid last week.  She reports with COVID she had nasal congestion and HA.  Patient started on Paxlovid and states within the first 2 days she started to experience a lot of side effects. She stopped it this Saturday night. She states she will have waves of symptoms including lower abdominal pain, nausea, poor appetite, and cooling sensation in her upper abdomen.  She reports no  diarrhea until this morning, prior was having intermittent constipation. She also started her menstrual cycle yesterday and had transvaginal ultrasound with OB/GYN which was normal. She states she does not typically have regular menstrual cycles. She also complains of burping. She is using ondansetron for nausea which helps. She is using dicyclomine 10 mg for abdominal cramping which she doesn't feel it helps much. She also has had cooling sensation in her upper abdomen /chest she thinks Newel Oien be heartburn or reflux she only experiences it first thing in the morning.  Denies eating right before bed or taking any occasions by mouth.  She denies pyrosis or regurgitation. Loss of appetite.  She states she has lost a few pounds in the past week.    A side note she has been taking multivitamin with iron for the low iron as instructed by Dr. Leonides Schanz after last visit. She also had her B12 checked by her PCP and it dropped to 195. She has had issues with taking the OTC B12 supplementations due to sensitives so there are concerns with her getting injections.   Wt Readings from Last 3 Encounters:  10/25/23 109 lb 2 oz (49.5 kg)  10/25/23 109 lb (49.4 kg)  10/22/23 114 lb (51.7 kg)    Past Medical History:  Diagnosis Date   Anal fissure    Anxiety    Cardiac arrhythmia    during chidlhood   Depression    Eczema    Nipple pain x's 2 months   Rosacea    Urticaria  Past Surgical History:  Procedure Laterality Date   AUGMENTATION MAMMAPLASTY Bilateral    breast enhancement & removal   BREAST IMPLANT REMOVAL Bilateral    CYST REMOVAL HAND Left 2025   REFRACTIVE SURGERY Bilateral     Current Outpatient Medications  Medication Sig Dispense Refill   Adapalene 0.3 % gel Apply 1 Application topically at bedtime.     Azelaic Acid 15 % gel AFTER SKIN IS THOROUGHLY WASHED AND PATTED DRY, GENTLY AND THOROUGHLY APPLY A THIN FILM TO AFFECTED AREA IN THE MORNING AND AT NIGHT 50 g 2   dicyclomine (BENTYL) 20 MG  tablet Take 1 tablet (20 mg total) by mouth 2 (two) times daily. 20 tablet 0   EPINEPHrine 0.3 mg/0.3 mL IJ SOAJ injection      fexofenadine (ALLEGRA) 180 MG tablet Take 180 mg by mouth daily.     ibuprofen (ADVIL) 200 MG tablet Take 200 mg by mouth every 6 (six) hours as needed for mild pain (pain score 1-3).     metroNIDAZOLE (METROGEL) 0.75 % gel Apply 1 Application topically daily.     MIEBO 1.338 GM/ML SOLN Apply to eye.     montelukast (SINGULAIR) 10 MG tablet TAKE 1 TABLET BY MOUTH EVERYDAY AT BEDTIME 90 tablet 0   Multiple Vitamin (MULTIVITAMIN) tablet Take 1 tablet by mouth daily.     ondansetron (ZOFRAN) 4 MG tablet Take 1 tablet (4 mg total) by mouth every 8 (eight) hours as needed for nausea or vomiting. 12 tablet 0   RESTASIS 0.05 % ophthalmic emulsion      spironolactone (ALDACTONE) 50 MG tablet TAKE 3 TABLETS BY MOUTH EVERY DAY 270 tablet 4   YASMIN 28 3-0.03 MG tablet Take 1 tablet by mouth daily. 84 tablet 4   Current Facility-Administered Medications  Medication Dose Route Frequency Provider Last Rate Last Admin   omalizumab Geoffry Paradise) prefilled syringe 300 mg  300 mg Subcutaneous Q28 days Verlee Monte, MD   300 mg at 05/14/23 1025    Allergies as of 10/25/2023 - Review Complete 10/25/2023  Allergen Reaction Noted   Benzoyl peroxide Dermatitis 04/16/2023   Iodine  01/23/2023    Family History  Problem Relation Age of Onset   Urticaria Mother    Eczema Mother    Allergic rhinitis Mother    Diabetes Mother    Colon polyps Mother    Irritable bowel syndrome Mother    Hyperlipidemia Mother    Depression Father    Anxiety disorder Father    BRCA 1/2 Neg Hx    Breast cancer Neg Hx    Colitis Neg Hx    Esophageal cancer Neg Hx    Pancreatic cancer Neg Hx    Stomach cancer Neg Hx     Review of Systems:    Constitutional: No weight loss, fever, chills, weakness or fatigue HEENT: Eyes: No change in vision               Ears, Nose, Throat:  No change in hearing or  congestion Skin: No rash or itching Cardiovascular: No chest pain, chest pressure or palpitations   Respiratory: No SOB or cough Gastrointestinal: See HPI and otherwise negative Genitourinary: No dysuria or change in urinary frequency Neurological: No headache, dizziness or syncope Musculoskeletal: No new muscle or joint pain Hematologic: No bleeding or bruising Psychiatric: No history of depression or anxiety    Physical Exam:  Vital signs: BP 104/68   Pulse 98   Ht 5\' 3"  (1.6 m)  Wt 109 lb 2 oz (49.5 kg)   LMP 10/01/2023 (Approximate)   BMI 19.33 kg/m   Constitutional:   Pleasant  female appears to be in NAD, Well developed, Well nourished, alert and cooperative Throat: Oral cavity and pharynx without inflammation, swelling or lesion.  Respiratory: Respirations even and unlabored. Lungs clear to auscultation bilaterally.   No wheezes, crackles, or rhonchi.  Cardiovascular: Normal S1, S2. Regular rate and rhythm. No peripheral edema, cyanosis or pallor.  Gastrointestinal:  Soft, nondistended, nontender. No rebound or guarding. Normal bowel sounds. No appreciable masses or hepatomegaly. Rectal:  Not performed.  Msk:  Symmetrical without gross deformities. Without edema, no deformity or joint abnormality.  Neurologic:  Alert and  oriented x4;  grossly normal neurologically.  Skin:   Dry and intact without significant lesions or rashes. Psychiatric: Oriented to person, place and time. Demonstrates good judgement and reason without abnormal affect or behaviors.  RELEVANT LABS AND IMAGING: CBC    Latest Ref Rng & Units 10/22/2023    7:39 AM 10/20/2023    9:40 AM 06/18/2023    7:30 AM  CBC  WBC 4.0 - 10.5 K/uL 8.5  8.0  5.3   Hemoglobin 12.0 - 15.0 g/dL 16.1  09.6  04.5   Hematocrit 36.0 - 46.0 % 47.2  47.1  45.4   Platelets 150 - 400 K/uL 241  217  202.0      CMP     Latest Ref Rng & Units 10/22/2023    7:39 AM 10/20/2023    9:40 AM 06/18/2023    7:30 AM  CMP  Glucose 70 -  99 mg/dL 409  811  914   BUN 6 - 20 mg/dL 30  28  21    Creatinine 0.44 - 1.00 mg/dL 7.82  9.56  2.13   Sodium 135 - 145 mmol/L 127  124  136   Potassium 3.5 - 5.1 mmol/L 5.0  4.6  4.1   Chloride 98 - 111 mmol/L 93  92  101   CO2 22 - 32 mmol/L 23  23  28    Calcium 8.9 - 10.3 mg/dL 9.7  9.5  9.5   Total Protein 6.5 - 8.1 g/dL  8.0  7.4   Total Bilirubin 0.0 - 1.2 mg/dL  0.4  0.6   Alkaline Phos 38 - 126 U/L  54  49   AST 15 - 41 U/L  23  23   ALT 0 - 44 U/L  52  40      Lab Results  Component Value Date   TSH 0.99 06/18/2023     Assessment: Encounter Diagnoses  Name Primary?   Nausea without vomiting Yes   Poor appetite    Dyspepsia    Altered bowel habits      43 year old female patient who recently tested positive for COVID and started on Paxlovid.  Unfortunately after 2 days patient was unable to tolerate medication due to GI symptoms clued abdominal cramping, altered bowel habits, nausea, and dyspepsia.  We discussed drinking plenty of fluids with electrolytes.  Can continue to use ondansetron for nausea as needed.  She feels the dicyclomine does not help and would like to try hyoscyamine for cramping.  Will have her continue the fiber supplementation to keep her bowels regular.  The cooling sensation she describes on empty stomach first thing in the morning I suspect is some form of dyspepsia and will do a trial of Pepcid for 2 weeks.  Patient instructed to follow-up with  her office if she does not see any improvement.  Plan: -Ondansetron prn nausea -Hyoscyamine 0.125mg  prn -Continue OTC fiber supplement -Push plenty of fluids -OTC Pepcid 20 mg at bedtime -follow-up as needed.   Thank you for the courtesy of this consult. Please call me with any questions or concerns.   Jerusalen Mateja, FNP-C [facility] 10/25/2023, 2:34 PM  Cc: Philip Aspen, [place]* Follow-up

## 2023-10-25 ENCOUNTER — Encounter: Payer: Self-pay | Admitting: Gastroenterology

## 2023-10-25 ENCOUNTER — Ambulatory Visit (INDEPENDENT_AMBULATORY_CARE_PROVIDER_SITE_OTHER): Admitting: Obstetrics and Gynecology

## 2023-10-25 ENCOUNTER — Encounter: Payer: Self-pay | Admitting: Obstetrics and Gynecology

## 2023-10-25 ENCOUNTER — Ambulatory Visit (INDEPENDENT_AMBULATORY_CARE_PROVIDER_SITE_OTHER): Admitting: Gastroenterology

## 2023-10-25 VITALS — BP 104/68 | HR 98 | Ht 63.0 in | Wt 109.1 lb

## 2023-10-25 VITALS — BP 102/60 | HR 89 | Temp 98.0°F | Wt 109.0 lb

## 2023-10-25 DIAGNOSIS — L700 Acne vulgaris: Secondary | ICD-10-CM

## 2023-10-25 DIAGNOSIS — E871 Hypo-osmolality and hyponatremia: Secondary | ICD-10-CM

## 2023-10-25 DIAGNOSIS — R194 Change in bowel habit: Secondary | ICD-10-CM

## 2023-10-25 DIAGNOSIS — R1013 Epigastric pain: Secondary | ICD-10-CM

## 2023-10-25 DIAGNOSIS — R110 Nausea: Secondary | ICD-10-CM | POA: Diagnosis not present

## 2023-10-25 DIAGNOSIS — R232 Flushing: Secondary | ICD-10-CM | POA: Diagnosis not present

## 2023-10-25 DIAGNOSIS — R102 Pelvic and perineal pain: Secondary | ICD-10-CM | POA: Diagnosis not present

## 2023-10-25 DIAGNOSIS — R748 Abnormal levels of other serum enzymes: Secondary | ICD-10-CM | POA: Diagnosis not present

## 2023-10-25 DIAGNOSIS — R630 Anorexia: Secondary | ICD-10-CM

## 2023-10-25 MED ORDER — YASMIN 28 3-0.03 MG PO TABS: 1.0000 | ORAL_TABLET | Freq: Every day | ORAL | 4 refills | Status: DC

## 2023-10-25 NOTE — Patient Instructions (Addendum)
-  Continue Ondansetron prn nausea -Will send new prescription for Hyoscyamine 0.125mg  prn abdominal cramping -Continue OTC fiber supplement -Push plenty of fluids with electrolytes. Diet as tolerated. Avoid trigger foods. - Start OTC Pepcid 20 mg 1 tablet at bedtime  _______________________________________________________  If your blood pressure at your visit was 140/90 or greater, please contact your primary care physician to follow up on this.  _______________________________________________________  If you are age 42 or older, your body mass index should be between 23-30. Your Body mass index is 19.33 kg/m. If this is out of the aforementioned range listed, please consider follow up with your Primary Care Provider.  If you are age 15 or younger, your body mass index should be between 19-25. Your Body mass index is 19.33 kg/m. If this is out of the aformentioned range listed, please consider follow up with your Primary Care Provider.   ________________________________________________________  The [place] [facility] providers would like to encourage you to use [facility] to communicate with providers for non-urgent requests or questions.  Due to long hold times on the telephone, sending your provider a message by [facility] may be a faster and more efficient way to get a response.  Please allow 48 business hours for a response.  Please remember that this is for non-urgent requests.  _______________________________________________________ Thank you for trusting me with your gastrointestinal care!   Margarite Gouge May, NP

## 2023-10-25 NOTE — Progress Notes (Signed)
 43 y.o. G0P0000 female with acne and hot flashes on COC, HSV, low AMH here for pelvic pain ins setting of COVID. Married.  Pt had COVID a 1.5 ago. Took Paxlovid. Sunday night woke up with extreme pelvic pain, nausea, slight dizziness. Went to Urgent care twice and was told it was coming from Paxlovid. Stopped the medication but still having severe pelvic pain, diarrhea, bloating, GERD. Taking ibuprofen, Zofran, Bentyl.  Tried Pepcid without much help in her symptoms.  Cycle started yesterday.  Pelvic pain is a 6/10 today. Drinking fluids, eating solids. Constipation >diarrhea. Initial head cold sx now gone. Has appointment with gastroenterologist later today.  Doing well on yasmin.  Labs reviewed from urgent care appointments notable for the following: 10/20/2023 lipase 60, AST 52 10/22/23 NA 127, Cr 1.08  Patient's last menstrual period was 10/24/2023 (approximate). Period Duration (Days): 2-3 Menstrual Flow: Light Menstrual Control: Maxi pad  GYN HISTORY: Low AMH  OB History  Gravida Para Term Preterm AB Living  0 0 0 0 0 0  SAB IAB Ectopic Multiple Live Births  0 0 0 0 0    Past Medical History:  Diagnosis Date   Anal fissure    Anxiety    Cardiac arrhythmia    during chidlhood   Depression    Eczema    Nipple pain x's 2 months   Rosacea    Urticaria     Past Surgical History:  Procedure Laterality Date   AUGMENTATION MAMMAPLASTY Bilateral    breast enhancement & removal   BREAST IMPLANT REMOVAL Bilateral    CYST REMOVAL HAND Left 2025   REFRACTIVE SURGERY Bilateral     Current Outpatient Medications on File Prior to Visit  Medication Sig Dispense Refill   Adapalene 0.3 % gel Apply 1 Application topically at bedtime.     Azelaic Acid 15 % gel AFTER SKIN IS THOROUGHLY WASHED AND PATTED DRY, GENTLY AND THOROUGHLY APPLY A THIN FILM TO AFFECTED AREA IN THE MORNING AND AT NIGHT 50 g 2   dicyclomine (BENTYL) 20 MG tablet Take 1 tablet (20 mg total) by mouth 2  (two) times daily. 20 tablet 0   EPINEPHrine 0.3 mg/0.3 mL IJ SOAJ injection      estradiol (ESTRACE VAGINAL) 0.1 MG/GM vaginal cream Apply 1/2 gram to vulva nightly for 2 weeks then decrease to 1/2 gram to vulva two nights a week. 20 g 3   fexofenadine (ALLEGRA) 180 MG tablet Take 180 mg by mouth daily.     metroNIDAZOLE (METROGEL) 0.75 % gel Apply 1 Application topically daily.     MIEBO 1.338 GM/ML SOLN Apply to eye.     montelukast (SINGULAIR) 10 MG tablet TAKE 1 TABLET BY MOUTH EVERYDAY AT BEDTIME 90 tablet 0   Multiple Vitamin (MULTIVITAMIN) tablet Take 1 tablet by mouth daily.     ondansetron (ZOFRAN) 4 MG tablet Take 1 tablet (4 mg total) by mouth every 8 (eight) hours as needed for nausea or vomiting. 12 tablet 0   ondansetron (ZOFRAN-ODT) 4 MG disintegrating tablet Take 1 tablet (4 mg total) by mouth every 8 (eight) hours as needed for nausea or vomiting. 20 tablet 0   RESTASIS 0.05 % ophthalmic emulsion      spironolactone (ALDACTONE) 50 MG tablet TAKE 3 TABLETS BY MOUTH EVERY DAY 270 tablet 4   Current Facility-Administered Medications on File Prior to Visit  Medication Dose Route Frequency Provider Last Rate Last Admin   omalizumab Geoffry Paradise) prefilled syringe 300 mg  300  mg Subcutaneous Q28 days Verlee Monte, MD   300 mg at 05/14/23 1025    Allergies  Allergen Reactions   Benzoyl Peroxide Dermatitis   Iodine       PE Today's Vitals   10/25/23 0805  BP: 102/60  Pulse: 89  Temp: 98 F (36.7 C)  TempSrc: Oral  SpO2: 99%  Weight: 109 lb (49.4 kg)   Body mass index is 19.31 kg/m.  Physical Exam Vitals reviewed. Exam conducted with a chaperone present.  Constitutional:      General: She is not in acute distress.    Appearance: Normal appearance.  HENT:     Head: Normocephalic and atraumatic.     Nose: Nose normal.  Eyes:     Extraocular Movements: Extraocular movements intact.     Conjunctiva/sclera: Conjunctivae normal.  Pulmonary:     Effort: Pulmonary  effort is normal.  Genitourinary:    General: Normal vulva.     Exam position: Lithotomy position.     Vagina: Normal. No vaginal discharge.     Cervix: Normal. No cervical motion tenderness, discharge or lesion.     Uterus: Normal. Not enlarged and not tender.      Adnexa: Right adnexa normal and left adnexa normal.     Comments: Menses present Musculoskeletal:        General: Normal range of motion.     Cervical back: Normal range of motion.  Neurological:     General: No focal deficit present.     Mental Status: She is alert.  Psychiatric:        Mood and Affect: Mood normal.        Behavior: Behavior normal.     10/24/2023 TVUS: Indications: Pelvic pain  Findings:   Uterus: 7.6 x 4.0 x 3.2 cm, anteverted. Endometrial thickness: 2 mm. Left ovary: 1.6 x 0.8 x 0.8 cm, normal-appearing. Right ovary: 1.4 x 0.8 x 0.5 cm, normal-appearing. No free fluid.  Impression:  Normal pelvic ultrasound.  Rosalyn Gess, MD   Assessment and Plan:        Pelvic pain Reviewed normal pelvic ultrasound with patient.  No acute pathology present. Symptoms are likely related to postinflammatory process related to recent COVID infection. Has follow-up with GI, which is appropriate given other symptoms including nausea and diarrhea. Continue Tylenol and ibuprofen as needed. -     Urinalysis,Complete w/RFL Culture -     CBC  Hot flashes Happy with pill, desires to take continuously.  Refills provided appropriately -     Yasmin 28; Take 1 tablet by mouth daily.  Dispense: 84 tablet; Refill: 4  Acne vulgaris -     Yasmin 28; Take 1 tablet by mouth daily.  Dispense: 84 tablet; Refill: 4  Elevated lipase -     Lipase  Low sodium levels -     COMPLETE METABOLIC PANEL WITH GFR We will repeat labs given derangements noted during urgent care evaluations, also likely due to COVID infection   Rosalyn Gess, MD

## 2023-10-26 LAB — COMPLETE METABOLIC PANEL WITH GFR
AG Ratio: 1.5 (calc) (ref 1.0–2.5)
ALT: 50 U/L — ABNORMAL HIGH (ref 6–29)
AST: 19 U/L (ref 10–30)
Albumin: 4.5 g/dL (ref 3.6–5.1)
Alkaline phosphatase (APISO): 54 U/L (ref 31–125)
BUN/Creatinine Ratio: 27 (calc) — ABNORMAL HIGH (ref 6–22)
BUN: 27 mg/dL — ABNORMAL HIGH (ref 7–25)
CO2: 24 mmol/L (ref 20–32)
Calcium: 9.5 mg/dL (ref 8.6–10.2)
Chloride: 93 mmol/L — ABNORMAL LOW (ref 98–110)
Creat: 0.99 mg/dL (ref 0.50–0.99)
Globulin: 3 g/dL (ref 1.9–3.7)
Glucose, Bld: 109 mg/dL — ABNORMAL HIGH (ref 65–99)
Potassium: 4.8 mmol/L (ref 3.5–5.3)
Sodium: 128 mmol/L — ABNORMAL LOW (ref 135–146)
Total Bilirubin: 0.6 mg/dL (ref 0.2–1.2)
Total Protein: 7.5 g/dL (ref 6.1–8.1)
eGFR: 73 mL/min/{1.73_m2} (ref >=60)

## 2023-10-26 LAB — CBC
HCT: 49.5 % — ABNORMAL HIGH (ref 35.0–45.0)
Hemoglobin: 16 g/dL — ABNORMAL HIGH (ref 11.7–15.5)
MCH: 30.4 pg (ref 27.0–33.0)
MCHC: 32.3 g/dL (ref 32.0–36.0)
MCV: 93.9 fL (ref 80.0–100.0)
MPV: 10.4 fL (ref 7.5–12.5)
Platelets: 315 10*3/uL (ref 140–400)
RBC: 5.27 10*6/uL — ABNORMAL HIGH (ref 3.80–5.10)
RDW: 12.2 % (ref 11.0–15.0)
WBC: 9.8 10*3/uL (ref 3.8–10.8)

## 2023-10-26 LAB — LIPASE: Lipase: 136 U/L — ABNORMAL HIGH (ref 7–60)

## 2023-10-27 LAB — URINE CULTURE
MICRO NUMBER:: 16202781
Result:: NO GROWTH
SPECIMEN QUALITY:: ADEQUATE

## 2023-10-27 LAB — URINALYSIS, COMPLETE W/RFL CULTURE
Bacteria, UA: NONE SEEN /HPF
Bilirubin Urine: NEGATIVE
Glucose, UA: NEGATIVE
Hyaline Cast: NONE SEEN /LPF
Ketones, ur: NEGATIVE
Leukocyte Esterase: NEGATIVE
Nitrites, Initial: NEGATIVE
Protein, ur: NEGATIVE
Specific Gravity, Urine: 1.004 (ref 1.001–1.035)
pH: 6 (ref 5.0–8.0)

## 2023-10-27 LAB — CULTURE INDICATED

## 2023-10-28 ENCOUNTER — Other Ambulatory Visit: Payer: Self-pay

## 2023-10-28 MED ORDER — HYOSCYAMINE SULFATE 0.125 MG PO TABS: 0.1250 mg | ORAL_TABLET | ORAL | 0 refills | Status: DC | PRN

## 2023-10-28 NOTE — Progress Notes (Signed)
 I agree with the assessment and plan as outlined by Ms. May.

## 2023-11-04 ENCOUNTER — Other Ambulatory Visit: Payer: Self-pay | Admitting: Internal Medicine

## 2023-11-04 ENCOUNTER — Ambulatory Visit (INDEPENDENT_AMBULATORY_CARE_PROVIDER_SITE_OTHER): Payer: Managed Care, Other (non HMO) | Admitting: Dermatology

## 2023-11-04 VITALS — BP 104/71

## 2023-11-04 DIAGNOSIS — L700 Acne vulgaris: Secondary | ICD-10-CM | POA: Diagnosis not present

## 2023-11-04 DIAGNOSIS — L720 Epidermal cyst: Secondary | ICD-10-CM

## 2023-11-04 MED ORDER — ALTRENO 0.05 % EX LOTN: TOPICAL_LOTION | CUTANEOUS | 0 refills | Status: DC

## 2023-11-04 NOTE — Progress Notes (Signed)
   Follow-Up Visit   Subjective  Marie Hanson is a 43 y.o. female who presents for the following: Acne  Patient present today for follow up visit. Patient was last evaluated on 07/22/23. At this visit patient was prescribed Spirolactone. Patient reports sxs are better. Patient reports medication changes. She stated that he GYN changed her BC to Yasmin 28 day pills.   The following portions of the chart were reviewed this encounter and updated as appropriate: medications, allergies, medical history  Review of Systems:  No other skin or systemic complaints except as noted in HPI or Assessment and Plan.  Objective  Well appearing patient in no apparent distress; mood and affect are within normal limits.   A focused examination was performed of the following areas: acne   Relevant exam findings are noted in the Assessment and Plan.           Assessment & Plan   ACNE VULGARIS Exam: Open comedones and inflammatory papules  Not at goal  - Assessment: Patient has been using metronidazole gel, azelaic acid, and adapalene for rosacea management. Clinician noted the patient's skin looks great. Patient is taking Yasmin birth control, which contains spironolactone that helps with redness and acne.  Treatment Plan: - Recommended [place] Redness Expert to help with redness relief  - Continue taking Spirolactone 100mg  daily, Metronidazole gel daily, Azelaic acid 15% gel in the AM. - D/C Adapalene, start Rx Altreno - use pea size amount on face 1x week at night. Sent to [facility].   MILIA   Assessment: Patient developed tiny white bumps, likely milia, following a chemical peel almost a year ago. The milia are not bothersome to the patient and are not very noticeable, especially with light foundation. Adapalene in the current regimen may help over time, but progress is slow.  - tiny firm white papules located at the forehead - type of cyst - benign - sometimes these will clear with  nightly OTC adapalene/Differin 0.1% gel or retinol. - may be extracted if symptomatic - observe  3. Cosmetic concerns - Assessment: Patient previously received Botox treatments at other facilities but is seeking new provider recommendations. Patient reports Botox effects lasting less than 3 months, possibly due to high metabolism.  - Plan:    Referral options provided for Botox treatments:     [facility] with Dr. Thayer Ohm      - Dr. Roseanne Reno in [place]     - Dr. Belia Heman (may not be accepting new patients)    Consider switching to Dysport for potentially longer-lasting effects    Other options mentioned: Xeomin and Daxxify   No follow-ups on file.    Documentation: I have reviewed the above documentation for accuracy and completeness, and I agree with the above.  I, Shirron Marcha Solders, CMA, am acting as scribe for [employer], DO.   Langston Reusing, DO

## 2023-11-04 NOTE — Patient Instructions (Addendum)
 Hello Iriana,  Thank you for visiting today. Here is a summary of the key instructions:  Medications: - Continue using metronidazole gel 0.75% in the morning - Continue using azelaic acid 15% in the morning - Start using Altreno (tretinoin lotion) one night a week - Stop using adapalene   - Start using using Altreno once night a week and then try increasing to 2 nights/week in June - Continue taking spironolactone as prescribed  Skincare:  - Consider using Avne Redness Expert (available at [facility], [facility], and [facility]) - Apply hyaluronic acid before other products - Use Aven Ciclophate as a gentle healing balm for sensitive skin  Follow-up: - Return for a follow-up appointment in 6 months  Adjustments: - If weather becomes more humid and warm by June, try using Altreno 2 nights a week - If Altreno causes dryness or irritation, reduce use  Additional Instructions: - Stay hydrated while taking spironolactone - Contact the office through MyChart with any questions  Please reach out if you have any questions or concerns.  Warm regards,  Dr. Langston Reusing, Dermatology       Important Information  Due to recent changes in healthcare laws, you may see results of your pathology and/or laboratory studies on MyChart before the doctors have had a chance to review them. We understand that in some cases there may be results that are confusing or concerning to you. Please understand that not all results are received at the same time and often the doctors may need to interpret multiple results in order to provide you with the best plan of care or course of treatment. Therefore, we ask that you please give [place] 2 business days to thoroughly review all your results before contacting the office for clarification. Should we see a critical lab result, you will be contacted sooner.   If You Need Anything After Your Visit  If you have any questions or concerns for your doctor, please call our  main line at [phone] If no one answers, please leave a voicemail as directed and we will return your call as soon as possible. Messages left after 4 pm will be answered the following business day.   You may also send [place] a message via MyChart. We typically respond to MyChart messages within 1-2 business days.  For prescription refills, please ask your pharmacy to contact our office. Our fax number is [phone].  If you have an urgent issue when the clinic is closed that cannot wait until the next business day, you can page your doctor at the number below.    Please note that while we do our best to be available for urgent issues outside of office hours, we are not available 24/7.   If you have an urgent issue and are unable to reach [place], you may choose to seek medical care at your doctor's office, retail clinic, urgent care center, or emergency room.  If you have a medical emergency, please immediately call 911 or go to the emergency department. In the event of inclement weather, please call our main line at [phone] for an update on the status of any delays or closures.  Dermatology Medication Tips: Please keep the boxes that topical medications come in in order to help keep track of the instructions about where and how to use these. Pharmacies typically print the medication instructions only on the boxes and not directly on the medication tubes.   If your medication is too expensive, please contact our office at [phone]  or send [place] a message through MyChart.   We are unable to tell what your co-pay for medications will be in advance as this is different depending on your insurance coverage. However, we may be able to find a substitute medication at lower cost or fill out paperwork to get insurance to cover a needed medication.   If a prior authorization is required to get your medication covered by your insurance company, please allow [place] 1-2 business days to complete this  process.  Drug prices often vary depending on where the prescription is filled and some pharmacies may offer cheaper prices.  The website www.goodrx.com contains coupons for medications through different pharmacies. The prices here do not account for what the cost may be with help from insurance (it may be cheaper with your insurance), but the website can give you the price if you did not use any insurance.  - You can print the associated coupon and take it with your prescription to the pharmacy.  - You may also stop by our office during regular business hours and pick up a GoodRx coupon card.  - If you need your prescription sent electronically to a different pharmacy, notify our office through [facility] or by phone at [phone]

## 2023-11-06 ENCOUNTER — Encounter: Admitting: Family Medicine

## 2023-11-08 ENCOUNTER — Encounter: Payer: Self-pay | Admitting: Obstetrics and Gynecology

## 2023-11-08 ENCOUNTER — Other Ambulatory Visit: Payer: Self-pay | Admitting: Internal Medicine

## 2023-11-08 ENCOUNTER — Ambulatory Visit (INDEPENDENT_AMBULATORY_CARE_PROVIDER_SITE_OTHER): Payer: Managed Care, Other (non HMO) | Admitting: Obstetrics and Gynecology

## 2023-11-08 VITALS — BP 112/74 | HR 76 | Temp 99.0°F | Ht 63.75 in | Wt 112.0 lb

## 2023-11-08 DIAGNOSIS — E871 Hypo-osmolality and hyponatremia: Secondary | ICD-10-CM | POA: Diagnosis not present

## 2023-11-08 DIAGNOSIS — R748 Abnormal levels of other serum enzymes: Secondary | ICD-10-CM | POA: Diagnosis not present

## 2023-11-08 DIAGNOSIS — L700 Acne vulgaris: Secondary | ICD-10-CM | POA: Diagnosis not present

## 2023-11-08 DIAGNOSIS — R232 Flushing: Secondary | ICD-10-CM

## 2023-11-08 NOTE — Progress Notes (Signed)
 43 y.o. G0P0000 female with acne and hot flashes on COC, HSV, low AMH here for medication f/u: Yasmin. Married.  Completely covered from COVID, saw her GI doctor who is managing her with medical therapies. Continues to do well on yasmin. Patient states that she is feeling great, says Yasmin has helped a lot and cramping has completely stopped since taking medication.  Will redraw labs today.  Patient's last menstrual period was 10/24/2023 (approximate). Period Duration (Days): 2-4 Period Pattern: (!) Irregular Menstrual Flow: Heavy Menstrual Control: Tampon, Maxi pad Dysmenorrhea: (!) Severe (Before starting Yasmine severe cramps) Dysmenorrhea Symptoms: Cramping  GYN HISTORY: Low AMH  OB History  Gravida Para Term Preterm AB Living  0 0 0 0 0 0  SAB IAB Ectopic Multiple Live Births  0 0 0 0 0    Past Medical History:  Diagnosis Date   Anal fissure    Anxiety    Cardiac arrhythmia    during chidlhood   Depression    Eczema    Nipple pain x's 2 months   Rosacea    Urticaria     Past Surgical History:  Procedure Laterality Date   AUGMENTATION MAMMAPLASTY Bilateral    breast enhancement & removal   BREAST IMPLANT REMOVAL Bilateral    CYST REMOVAL HAND Left 2025   REFRACTIVE SURGERY Bilateral     Current Outpatient Medications on File Prior to Visit  Medication Sig Dispense Refill   Adapalene 0.3 % gel Apply 1 Application topically at bedtime.     Azelaic Acid 15 % gel AFTER SKIN IS THOROUGHLY WASHED AND PATTED DRY, GENTLY AND THOROUGHLY APPLY A THIN FILM TO AFFECTED AREA IN THE MORNING AND AT NIGHT 50 g 2   EPINEPHrine 0.3 mg/0.3 mL IJ SOAJ injection      estradiol (ESTRACE) 0.1 MG/GM vaginal cream Place vaginally.     fexofenadine (ALLEGRA) 180 MG tablet Take 180 mg by mouth daily.     ibuprofen (ADVIL) 200 MG tablet Take 200 mg by mouth every 6 (six) hours as needed for mild pain (pain score 1-3).     metroNIDAZOLE (METROGEL) 0.75 % gel Apply 1 Application  topically daily.     MIEBO 1.338 GM/ML SOLN Apply to eye.     Multiple Vitamin (MULTIVITAMIN) tablet Take 1 tablet by mouth daily.     RESTASIS 0.05 % ophthalmic emulsion      spironolactone (ALDACTONE) 50 MG tablet TAKE 3 TABLETS BY MOUTH EVERY DAY 270 tablet 4   Tretinoin (ALTRENO) 0.05 % LOTN Use pea size amount on face 1 time a week at night. 45 g 0   YASMIN 28 3-0.03 MG tablet Take 1 tablet by mouth daily. 84 tablet 4   Current Facility-Administered Medications on File Prior to Visit  Medication Dose Route Frequency Provider Last Rate Last Admin   omalizumab Geoffry Paradise) prefilled syringe 300 mg  300 mg Subcutaneous Q28 days Verlee Monte, MD   300 mg at 05/14/23 1025    Allergies  Allergen Reactions   Benzoyl Peroxide Dermatitis   Iodine       PE Today's Vitals   11/08/23 0843  BP: 112/74  Pulse: 76  Temp: 99 F (37.2 C)  TempSrc: Oral  SpO2: 100%  Weight: 112 lb (50.8 kg)  Height: 5' 3.75" (1.619 m)   Body mass index is 19.38 kg/m.  Physical Exam Vitals reviewed.  Constitutional:      General: She is not in acute distress.    Appearance: Normal  appearance.  HENT:     Head: Normocephalic and atraumatic.     Nose: Nose normal.  Eyes:     Extraocular Movements: Extraocular movements intact.     Conjunctiva/sclera: Conjunctivae normal.  Pulmonary:     Effort: Pulmonary effort is normal.  Musculoskeletal:        General: Normal range of motion.     Cervical back: Normal range of motion.  Neurological:     General: No focal deficit present.     Mental Status: She is alert.  Psychiatric:        Mood and Affect: Mood normal.        Behavior: Behavior normal.    Assessment and Plan:        Hot flashes Acne vulgaris Doing well with Yasmin, will continue at this time. Resolved COVID infection,m desires repeat labs to ensure normalization Return office for annual exam  Elevated lipase -     Lipase  Low sodium levels -     COMPLETE METABOLIC PANEL WITHOUT  GFR  Rosalyn Gess, MD

## 2023-11-09 LAB — COMPLETE METABOLIC PANEL WITHOUT GFR
AG Ratio: 1.8 (calc) (ref 1.0–2.5)
ALT: 25 U/L (ref 6–29)
AST: 14 U/L (ref 10–30)
Albumin: 4.5 g/dL (ref 3.6–5.1)
Alkaline phosphatase (APISO): 55 U/L (ref 31–125)
BUN/Creatinine Ratio: 30 (calc) — ABNORMAL HIGH (ref 6–22)
BUN: 27 mg/dL — ABNORMAL HIGH (ref 7–25)
CO2: 23 mmol/L (ref 20–32)
Calcium: 9.6 mg/dL (ref 8.6–10.2)
Chloride: 98 mmol/L (ref 98–110)
Creat: 0.89 mg/dL (ref 0.50–0.99)
Globulin: 2.5 g/dL (ref 1.9–3.7)
Glucose, Bld: 89 mg/dL (ref 65–99)
Potassium: 4.9 mmol/L (ref 3.5–5.3)
Sodium: 132 mmol/L — ABNORMAL LOW (ref 135–146)
Total Bilirubin: 0.5 mg/dL (ref 0.2–1.2)
Total Protein: 7 g/dL (ref 6.1–8.1)

## 2023-11-09 LAB — LIPASE: Lipase: 69 U/L — ABNORMAL HIGH (ref 7–60)

## 2023-11-15 ENCOUNTER — Encounter: Payer: Self-pay | Admitting: Obstetrics and Gynecology

## 2023-11-28 ENCOUNTER — Ambulatory Visit (INDEPENDENT_AMBULATORY_CARE_PROVIDER_SITE_OTHER): Admitting: Family Medicine

## 2023-11-28 ENCOUNTER — Encounter: Payer: Self-pay | Admitting: Family Medicine

## 2023-11-28 VITALS — BP 105/67 | HR 76 | Ht 63.0 in | Wt 112.0 lb

## 2023-11-28 DIAGNOSIS — Z0000 Encounter for general adult medical examination without abnormal findings: Secondary | ICD-10-CM | POA: Diagnosis not present

## 2023-11-28 DIAGNOSIS — M2550 Pain in unspecified joint: Secondary | ICD-10-CM

## 2023-11-28 DIAGNOSIS — J3089 Other allergic rhinitis: Secondary | ICD-10-CM | POA: Diagnosis not present

## 2023-11-28 DIAGNOSIS — E871 Hypo-osmolality and hyponatremia: Secondary | ICD-10-CM

## 2023-11-28 DIAGNOSIS — E538 Deficiency of other specified B group vitamins: Secondary | ICD-10-CM

## 2023-11-28 DIAGNOSIS — E611 Iron deficiency: Secondary | ICD-10-CM

## 2023-11-28 DIAGNOSIS — F419 Anxiety disorder, unspecified: Secondary | ICD-10-CM | POA: Diagnosis not present

## 2023-11-28 DIAGNOSIS — R748 Abnormal levels of other serum enzymes: Secondary | ICD-10-CM

## 2023-11-28 DIAGNOSIS — R109 Unspecified abdominal pain: Secondary | ICD-10-CM

## 2023-11-28 DIAGNOSIS — F32A Depression, unspecified: Secondary | ICD-10-CM

## 2023-11-28 DIAGNOSIS — J302 Other seasonal allergic rhinitis: Secondary | ICD-10-CM

## 2023-11-28 MED ORDER — IPRATROPIUM BROMIDE 0.03 % NA SOLN: 2.0000 | Freq: Two times a day (BID) | NASAL | 3 refills | Status: DC

## 2023-11-28 NOTE — Progress Notes (Signed)
 New Patient Office Visit  Subjective    Patient ID: Marie Hanson, female    DOB: 1980-10-03  Age: 43 y.o. MRN: [id number]  CC:  Chief Complaint  Patient presents with   Establish Care    HPI Marie Hanson presents to establish care. Lives with husband, not currently working.   Discussed the use of AI scribe software for clinical note transcription with the patient, who gave verbal consent to proceed.  History of Present Illness Marie Hanson is a 43 year old female who presents as a new patient.   She believes she developed symptoms consistent with histamine intolerance a few months after contracting COVID-19 in the fall of 2023. Symptoms include a runny nose, itchiness, acne, and heart palpitations, primarily occurring after eating. These symptoms are localized from the neck up, with flushing and hot ears, but rarely hives. She follows a strict low-histamine diet and takes multiple antihistamines, including Allegra, Zyrtec, and Singulair, as well as supplements like a DAO food supplement, gluten ease, lactaid, and occasional Benadryl. Despite these measures, her symptoms persist. She has been evaluated by GI, allergist, and a functional medicine doctor with no significant help.   She has a history of anxiety and depression and has previously been on Lexapro and Zoloft. She reports increased anxiety recently but is not currently on any psychiatric medications. She is in counseling with a psychologist but would like referral to psychiatrist for med management.   She has a history of eczema, rosacea, and urticaria, with a family history of similar conditions, including eczema, urticaria, and allergies. She uses topical treatments such as adapalene, Metrogel, tretinoin, and azelaic acid for her skin conditions and is on spironolactone 150 mg daily for acne. She follows with [facility].  She experienced a suture abscess following a procedure for a ganglion cyst  about eight weeks ago, which has not healed. She is currently on doxycycline for this issue.  She had COVID-19 again earlier this year, which led to stomach issues such as cramping and nausea, but no diarrhea. These symptoms have improved but still occur intermittently. She takes Gas-X for relief, which sometimes helps. She has been seen for GI for this and symptoms have somewhat improved - she would like me to repeat labs today.   She has been experiencing joint pain and stiffness, which she describes as 'full body arthritis,' and notes that her symptoms have worsened since her initial COVID-19 infection. Previous autoimmune testing was negative, but she reports persistent symptoms and would like to recheck some levels.   She has a history of low B12 and iron levels, for which she takes iron supplements. She cannot tolerate B12 supplements due to acne flare-ups and instead tries to increase dietary intake of B12-rich foods.  She reports feeling dehydrated despite drinking plenty of water, possibly related to her high dose of spironolactone, which has previously caused low sodium levels. She has difficulty finding suitable electrolyte supplements due to her histamine intolerance.     Outpatient Encounter Medications as of 11/28/2023  Medication Sig   Adapalene 0.3 % gel Apply 1 Application topically at bedtime.   Azelaic Acid 15 % gel AFTER SKIN IS THOROUGHLY WASHED AND PATTED DRY, GENTLY AND THOROUGHLY APPLY A THIN FILM TO AFFECTED AREA IN THE MORNING AND AT NIGHT   doxycycline (VIBRAMYCIN) 100 MG capsule Take 100 mg by mouth 2 (two) times daily.   EPINEPHrine 0.3 mg/0.3 mL IJ SOAJ injection    ibuprofen (ADVIL) 200 MG tablet Take  200 mg by mouth every 6 (six) hours as needed for mild pain (pain score 1-3).   ipratropium (ATROVENT) 0.03 % nasal spray Place 2 sprays into both nostrils every 12 (twelve) hours.   metroNIDAZOLE (METROGEL) 0.75 % gel Apply 1 Application topically daily.   MIEBO  1.338 GM/ML SOLN Apply to eye.   montelukast (SINGULAIR) 10 MG tablet TAKE 1 TABLET BY MOUTH EVERYDAY AT BEDTIME   Multiple Vitamin (MULTIVITAMIN) tablet Take 1 tablet by mouth daily.   RESTASIS 0.05 % ophthalmic emulsion    spironolactone (ALDACTONE) 50 MG tablet TAKE 3 TABLETS BY MOUTH EVERY DAY   Tretinoin (ALTRENO) 0.05 % LOTN Use pea size amount on face 1 time a week at night.   YASMIN 28 3-0.03 MG tablet Take 1 tablet by mouth daily.   fexofenadine (ALLEGRA) 180 MG tablet Take 180 mg by mouth daily. (Patient not taking: Reported on 11/28/2023)   [DISCONTINUED] estradiol (ESTRACE) 0.1 MG/GM vaginal cream Place vaginally.   Facility-Administered Encounter Medications as of 11/28/2023  Medication   omalizumab Geoffry Paradise) prefilled syringe 300 mg    Past Medical History:  Diagnosis Date   Anal fissure    Anxiety    Cardiac arrhythmia    during chidlhood   Depression    Eczema    Nipple pain x's 2 months   Rosacea    Urticaria     Past Surgical History:  Procedure Laterality Date   AUGMENTATION MAMMAPLASTY Bilateral    breast enhancement & removal   BREAST IMPLANT REMOVAL Bilateral    CYST REMOVAL HAND Left 2025   REFRACTIVE SURGERY Bilateral     Family History  Problem Relation Age of Onset   Urticaria Mother    Eczema Mother    Allergic rhinitis Mother    Diabetes Mother    Colon polyps Mother    Irritable bowel syndrome Mother    Hyperlipidemia Mother    Depression Father    Anxiety disorder Father    BRCA 1/2 Neg Hx    Breast cancer Neg Hx    Colitis Neg Hx    Esophageal cancer Neg Hx    Pancreatic cancer Neg Hx    Stomach cancer Neg Hx     Social History   Socioeconomic History   Marital status: Married    Spouse name: Not on file   Number of children: 0   Years of education: Not on file   Highest education level: Bachelor's degree (e.g., BA, AB, BS)  Occupational History   Not on file  Tobacco Use   Smoking status: Never    Passive exposure: Never    Smokeless tobacco: Never  Vaping Use   Vaping status: Never Used  Substance and Sexual Activity   Alcohol use: Not Currently   Drug use: Never   Sexual activity: Yes    Partners: Male    Birth control/protection: OCP  Other Topics Concern   Not on file  Social History Narrative   Not on file   Social Drivers of Health   Financial Resource Strain: Low Risk  (11/27/2023)   Overall Financial Resource Strain (CARDIA)    Difficulty of Paying Living Expenses: Not hard at all  Food Insecurity: No Food Insecurity (11/27/2023)   Hunger Vital Sign    Worried About Running Out of Food in the Last Year: Never true    Ran Out of Food in the Last Year: Never true  Transportation Needs: No Transportation Needs (11/27/2023)   PRAPARE - Transportation  Lack of Transportation (Medical): No    Lack of Transportation (Non-Medical): No  Physical Activity: Insufficiently Active (11/27/2023)   Exercise Vital Sign    Days of Exercise per Week: 4 days    Minutes of Exercise per Session: 30 min  Stress: Stress Concern Present (11/27/2023)   [employer] of Occupational Health - Occupational Stress Questionnaire    Feeling of Stress : Rather much  Social Connections: Moderately Isolated (11/27/2023)   Social Connection and Isolation Panel [NHANES]    Frequency of Communication with Friends and Family: Three times a week    Frequency of Social Gatherings with Friends and Family: Once a week    Attends Religious Services: Never    Database administrator or Organizations: No    Attends Engineer, structural: Not on file    Marital Status: Married  Catering manager Violence: Not on file    ROS All review of systems negative except what is listed in the HPI    Objective    BP 105/67   Pulse 76   Ht 5\' 3"  (1.6 m)   Wt 112 lb (50.8 kg)   SpO2 100%   BMI 19.84 kg/m   Physical Exam Vitals reviewed.  Constitutional:      Appearance: Normal appearance.  Cardiovascular:     Rate  and Rhythm: Normal rate and regular rhythm.     Heart sounds: Normal heart sounds.  Pulmonary:     Effort: Pulmonary effort is normal.     Breath sounds: Normal breath sounds.  Skin:    General: Skin is warm and dry.  Neurological:     Mental Status: She is alert and oriented to person, place, and time.  Psychiatric:        Mood and Affect: Mood normal.        Behavior: Behavior normal.        Thought Content: Thought content normal.        Judgment: Judgment normal.         Assessment & Plan:   Problem List Items Addressed This Visit       Active Problems   Seasonal and perennial allergic rhinitis   Relevant Medications   ipratropium (ATROVENT) 0.03 % nasal spray   Vitamin B12 deficiency   Relevant Orders   B12 and Folate Panel   CBC with Differential/Platelet   Other Visit Diagnoses       Encounter for medical examination to establish care    -  Primary     Polyarthralgia       Relevant Orders   Antinuclear Antib (ANA)   Rheumatoid Factor   Sedimentation rate     Anxiety and depression       Relevant Orders   Ambulatory referral to Behavioral Health     Abdominal cramping         Elevated lipase       Relevant Orders   Comprehensive metabolic panel with GFR   Lipase     Iron deficiency       Relevant Orders   CBC with Differential/Platelet   Iron, TIBC and Ferritin Panel       Assessment & Plan ? Histamine Intolerance Negative allergy tests. Xolair trial ineffective with side effects. Discussed ipratropium nasal spray. Reviewing allergist notes and researching post-COVID histamine intolerance. - Prescribe ipratropium nasal spray. - Review allergist notes.  Post-COVID Gastrointestinal Symptoms Intermittent gastrointestinal symptoms post-COVID. Previous elevated lipase and dehydration. High-dose spironolactone may contribute to dehydration and  electrolyte imbalance.  - Recheck electrolytes. - Consider adjusting spironolactone dosage if  necessary.  Joint Pain and Inflammation Increased joint pain and stiffness post-COVID. Previous negative autoimmune tests. Plan to repeat tests due to worsening symptoms. - Repeat autoimmune tests including ANA and sed rate.  Anxiety and Depression Increased anxiety. Prefers to avoid medication until she can get in with psychiatry. Continue counseling.   Iron Deficiency Low iron levels managed with supplements. Plan to recheck levels. - Recheck iron levels.  Vitamin B12 Deficiency Low B12 levels managed through diet due to supplement reactions. Plan to recheck levels. - Recheck B12 levels.      Return if symptoms worsen or fail to improve, for  - pending labs.   Everlina Hock, NP

## 2023-11-29 ENCOUNTER — Encounter: Payer: Self-pay | Admitting: Family Medicine

## 2023-11-29 DIAGNOSIS — F419 Anxiety disorder, unspecified: Secondary | ICD-10-CM

## 2023-11-30 LAB — B12 AND FOLATE PANEL
Folate: 23.2 ng/mL
Vitamin B-12: 330 pg/mL (ref 200–1100)

## 2023-11-30 LAB — COMPREHENSIVE METABOLIC PANEL WITH GFR
AG Ratio: 1.8 (calc) (ref 1.0–2.5)
ALT: 23 U/L (ref 6–29)
AST: 14 U/L (ref 10–30)
Albumin: 4.4 g/dL (ref 3.6–5.1)
Alkaline phosphatase (APISO): 61 U/L (ref 31–125)
BUN/Creatinine Ratio: 33 (calc) — ABNORMAL HIGH (ref 6–22)
BUN: 31 mg/dL — ABNORMAL HIGH (ref 7–25)
CO2: 25 mmol/L (ref 20–32)
Calcium: 9 mg/dL (ref 8.6–10.2)
Chloride: 98 mmol/L (ref 98–110)
Creat: 0.94 mg/dL (ref 0.50–0.99)
Globulin: 2.5 g/dL (ref 1.9–3.7)
Glucose, Bld: 95 mg/dL (ref 65–99)
Potassium: 4.4 mmol/L (ref 3.5–5.3)
Sodium: 132 mmol/L — ABNORMAL LOW (ref 135–146)
Total Bilirubin: 0.3 mg/dL (ref 0.2–1.2)
Total Protein: 6.9 g/dL (ref 6.1–8.1)
eGFR: 78 mL/min/{1.73_m2} (ref >=60)

## 2023-11-30 LAB — CBC WITH DIFFERENTIAL/PLATELET
Absolute Lymphocytes: 1548 {cells}/uL (ref 850–3900)
Absolute Monocytes: 392 {cells}/uL (ref 200–950)
Basophils Absolute: 39 {cells}/uL (ref 0–200)
Basophils Relative: 0.4 %
Eosinophils Absolute: 39 {cells}/uL (ref 15–500)
Eosinophils Relative: 0.4 %
HCT: 45.8 % — ABNORMAL HIGH (ref 35.0–45.0)
Hemoglobin: 14.9 g/dL (ref 11.7–15.5)
MCH: 30.8 pg (ref 27.0–33.0)
MCHC: 32.5 g/dL (ref 32.0–36.0)
MCV: 94.6 fL (ref 80.0–100.0)
MPV: 10.7 fL (ref 7.5–12.5)
Monocytes Relative: 4 %
Neutro Abs: 7781 {cells}/uL (ref 1500–7800)
Neutrophils Relative %: 79.4 %
Platelets: 231 10*3/uL (ref 140–400)
RBC: 4.84 10*6/uL (ref 3.80–5.10)
RDW: 12.3 % (ref 11.0–15.0)
Total Lymphocyte: 15.8 %
WBC: 9.8 10*3/uL (ref 3.8–10.8)

## 2023-11-30 LAB — RHEUMATOID FACTOR: Rheumatoid fact SerPl-aCnc: 12 [IU]/mL (ref <14)

## 2023-11-30 LAB — ANTI-NUCLEAR AB-TITER (ANA TITER): ANA Titer 1: 1:320 {titer} — ABNORMAL HIGH

## 2023-11-30 LAB — IRON,TIBC AND FERRITIN PANEL
%SAT: 20 % (ref 16–45)
Ferritin: 69 ng/mL (ref 16–232)
Iron: 84 ug/dL (ref 40–190)
TIBC: 412 ug/dL (ref 250–450)

## 2023-11-30 LAB — ANA: Anti Nuclear Antibody (ANA): POSITIVE — AB

## 2023-11-30 LAB — LIPASE: Lipase: 55 U/L (ref 7–60)

## 2023-11-30 LAB — SEDIMENTATION RATE: Sed Rate: 2 mm/h (ref 0–20)

## 2023-12-01 ENCOUNTER — Other Ambulatory Visit: Payer: Self-pay | Admitting: Internal Medicine

## 2023-12-02 ENCOUNTER — Encounter: Payer: Self-pay | Admitting: Family Medicine

## 2023-12-02 MED ORDER — BUSPIRONE HCL 7.5 MG PO TABS: 7.5000 mg | ORAL_TABLET | Freq: Two times a day (BID) | ORAL | 3 refills | Status: DC

## 2023-12-02 NOTE — Addendum Note (Signed)
 Addended by: Minna Amass B on: 12/02/2023 08:05 AM   Modules accepted: Orders

## 2023-12-03 NOTE — Progress Notes (Unsigned)
 Office Visit Note  Patient: Marie Hanson             Date of Birth: [birth year]           MRN: [id number]             PCP: Everlina Hock, NP Referring: Everlina Hock, NP Visit Date: 12/05/2023 Occupation: @GUAROCC @  Subjective:  +ANA  History of Present Illness: Marie Hanson is a 43 y.o. female with history of polyarthralgia and raynaud's.   Patient was seen for her initial consultation with Dr. Alvira Josephs on 06/26/2023 but did not return for a NPFU visit.  Patient presents today with increased joint pain and joint stiffness.  Patient was diagnosed with COVID-19 on 10/12/2023 which she feels has exacerbated her symptoms.  The patient has been highly symptomatic with histamine  intolerance.  She was under the care of an allergist and tried allergy  shots including Xolair  with no benefit.  She has been taking over-the-counter antihistamines as well as Singulair  but remains symptomatic.   She has been trying to follow a low histamine  diet.  She has updated lab work ordered by her PCP on 11/28/23 revealing a positive ANA and presents today for further evaluation.  Patient is currently experiencing increased discomfort in the neck, wrist joints, knee joints, ankle joints, and both feet.  She denies any visible swelling.  She has continued to remain active but has not been able to perform her normal exercise regimen due to the severity of pain.  She has not been able to do as much cardio but has been practicing yoga.  She denies any nocturnal pain.  She has been taking ibuprofen sparingly which does alleviate her symptoms when taken.  She has not taken prednisone  recently.  Patient states that she has been under the care of orthopedics for weakness in her right upper leg and is scheduled for an MRI for further evaluation. She continues to have intermittent symptoms of Raynaud's phenomenon affecting her fingertips and toes.  She has also had photosensitivity since she was a child.  Patient  states that she has been experiencing frequent facial flushing.  She denies any swollen lymph nodes.  Her energy level has been stable.  She denies any oral or nasal ulcerations.  She has had chronic eye dryness and uses prescription strength and over-the-counter eyedrops for relief.  Activities of Daily Living:  Patient reports morning stiffness for all day. Patient Reports nocturnal pain.  Difficulty dressing/grooming: Denies Difficulty climbing stairs: Denies Difficulty getting out of chair: Denies Difficulty using hands for taps, buttons, cutlery, and/or writing: Denies  Review of Systems  Constitutional:  Negative for fatigue.  HENT:  Negative for mouth sores, mouth dryness and nose dryness.   Eyes:  Positive for dryness. Negative for pain.  Respiratory:  Negative for shortness of breath and difficulty breathing.   Cardiovascular:  Positive for palpitations. Negative for chest pain.  Gastrointestinal:  Negative for blood in stool, constipation and diarrhea.  Endocrine: Negative for increased urination.  Genitourinary:  Negative for involuntary urination.  Musculoskeletal:  Positive for joint pain, joint pain, myalgias, muscle weakness, morning stiffness, muscle tenderness and myalgias. Negative for gait problem and joint swelling.  Skin:  Positive for color change and sensitivity to sunlight. Negative for rash and hair loss.  Allergic/Immunologic: Negative for susceptible to infections.  Neurological:  Positive for numbness. Negative for dizziness and headaches.  Hematological:  Negative for swollen glands.  Psychiatric/Behavioral:  Positive for depressed mood. Negative  for sleep disturbance. The patient is nervous/anxious.     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   Hot flashes 07/26/2023   Vitamin B12 deficiency 06/20/2023   Flushing 06/03/2023   Chronic urticaria 03/18/2023   Acne vulgaris 03/18/2023    Seasonal and perennial allergic rhinitis 10/24/2022   Allergic conjunctivitis of both eyes 10/24/2022   Nickel allergy  10/24/2022   Raynaud's phenomenon 06/18/2022   Rosacea 03/05/2019   Anxiety 03/05/2019    Past Medical History:  Diagnosis Date   Anal fissure    Anxiety    Cardiac arrhythmia    during chidlhood   Depression    Eczema    Nipple pain x's 2 months   Rosacea    Urticaria     Family History  Problem Relation Age of Onset   Urticaria Mother    Eczema Mother    Allergic rhinitis Mother    Diabetes Mother    Colon polyps Mother    Irritable bowel syndrome Mother    Hyperlipidemia Mother    Depression Father    Anxiety disorder Father    BRCA 1/2 Neg Hx    Breast cancer Neg Hx    Colitis Neg Hx    Esophageal cancer Neg Hx    Pancreatic cancer Neg Hx    Stomach cancer Neg Hx    Past Surgical History:  Procedure Laterality Date   AUGMENTATION MAMMAPLASTY Bilateral    breast enhancement & removal   BREAST IMPLANT REMOVAL Bilateral    CYST REMOVAL HAND Left 2025   ganglion cyst   REFRACTIVE SURGERY Bilateral    Social History   Social History Narrative   Not on file   Immunization History  Administered Date(s) Administered   Influenza, High Dose Seasonal PF 03/31/2022   Influenza, Seasonal, Injecte, Preservative Fre 05/11/2023   Influenza,inj,Quad PF,6+ Mos 03/28/2019   Influenza-Unspecified 04/03/2019, 04/20/2020, 04/27/2021   Novavax(Covid-19) Vaccine 08/29/2023   PFIZER(Purple Top)SARS-COV-2 Vaccination 11/12/2019, 12/07/2019, 07/01/2020   Tdap 03/05/2019     Objective: Vital Signs: BP 132/83 (BP Location: Left Arm, Patient Position: Sitting, Cuff Size: Normal)   Pulse 77   Resp 13   Ht 5\' 3"  (1.6 m)   Wt 114 lb 6.4 oz (51.9 kg)   BMI 20.27 kg/m    Physical Exam Vitals and nursing note reviewed.  Constitutional:      Appearance: She is well-developed.  HENT:     Head: Normocephalic and atraumatic.  Eyes:     Conjunctiva/sclera:  Conjunctivae normal.  Cardiovascular:     Rate and Rhythm: Normal rate and regular rhythm.     Heart sounds: Normal heart sounds.  Pulmonary:     Effort: Pulmonary effort is normal.     Breath sounds: Normal breath sounds.  Abdominal:     General: Bowel sounds are normal.     Palpations: Abdomen is soft.  Musculoskeletal:     Cervical back: Normal range of motion.  Lymphadenopathy:     Cervical: No cervical adenopathy.  Skin:    General: Skin is warm and dry.     Capillary Refill: Capillary refill takes 2 to 3 seconds.     Comments: Livdeo reticularis UE and LE No signs of sclerodactyly.  No digital ulcerations noted.  Delayed capillary refill 2 to 3 seconds.  Neurological:     Mental Status: She is alert and oriented to person, place, and time.  Psychiatric:  Behavior: Behavior normal.      Musculoskeletal Exam: C-spine has discomfort with ROM.  Shoulder joints, elbow joints, wrist joints, MCPs, PIPs, DIPs have good range of motion with no synovitis.  Complete fist formation bilaterally.  Hip joints have good range of motion.  Knee joints have good range of motion with no warmth or effusion.  Ankle joints have good range of motion with tenderness bilaterally.   CDAI Exam: CDAI Score: -- Patient Global: --; Provider Global: -- Swollen: --; Tender: -- Joint Exam 12/05/2023   No joint exam has been documented for this visit   There is currently no information documented on the homunculus. Go to the Rheumatology activity and complete the homunculus joint exam.  Investigation: No additional findings.  Imaging: No results found.  Recent Labs: Lab Results  Component Value Date   WBC 9.8 11/28/2023   HGB 14.9 11/28/2023   PLT 231 11/28/2023   NA 132 (L) 11/28/2023   K 4.4 11/28/2023   CL 98 11/28/2023   CO2 25 11/28/2023   GLUCOSE 95 11/28/2023   BUN 31 (H) 11/28/2023   CREATININE 0.94 11/28/2023   BILITOT 0.3 11/28/2023   ALKPHOS 54 10/20/2023   AST 14  11/28/2023   ALT 23 11/28/2023   PROT 6.9 11/28/2023   ALBUMIN 3.9 10/20/2023   CALCIUM 9.0 11/28/2023    Speciality Comments: No specialty comments available.  Procedures:  No procedures performed Allergies: Benzoyl peroxide     Assessment / Plan:     Visit Diagnoses: Polyarthralgia - Patient presents today with increased joint pain and joint stiffness exacerbated by most recent COVID-19 infection on 10/12/2023.  She has been experiencing increased pain in the cervical spine, both wrists, both knees, both ankles, and both feet.  The severity of pain and stiffness has been interfering with her quality of life as well as her activity level.  She has been unable to perform strenuous activity.  She has not noticed any visible swelling but feels generally inflamed.  No obvious synovitis was noted on examination today.   ANA was 1: 320 NH on 11/28/2023.  ESR was within normal limits and RF was negative at that time.  Plan to obtain the following lab work today for further evaluation as well as including a CRP.   Plan: Analyzer ANA IFA w/RFLX Titer/Pattern,Systemic Autoimmune Panel 1, C-reactive protein, TSH  Positive ANA (antinuclear antibody) -11/28/23: ANA 1:320 NH, ESR WNL, RF negative.  03/12/23: ANA negative, RF-, Ro-, ESR and CRP WNL  Plan to obtain the following lab work today.  Plan: Analyzer ANA IFA w/RFLX Titer/Pattern,Systemic Autoimmune Panel 1, C-reactive protein, Histone antibodies, IgG, blood, TSH  Facial rash - She has been experiencing an increased frequency of facial flushing.  Patient has been under the care of Dr. Myrtie Atkinson.  ANA was positive on 11/28/2023 so the following lab work will be obtained today for further evaluation.    Plan: Analyzer ANA IFA w/RFLX Titer/Pattern,Systemic Autoimmune Panel 1, Histone antibodies, IgG, blood  Other fatigue - Plan to obtain the following lab work today. Plan: TSH  Raynaud's phenomenon without gangrene -longstanding history of Raynaud's  phenomenon affecting fingertips and toes.  Her her symptoms were exacerbated by recent COVID-19 infection in March 2025.  Delayed capillary refill was noted on examination today 2 to 3 seconds.  No signs of sclerodactyly noted.  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   03/12/23: anticardiolipin Ig-& IgM-, beta-2 GP 1 IgM and IgG negative, SSA-,  SSB-, ANA-, RF-, TTG-, CRP<1, ESR 5.  ANA was +1: 328 NH on 11/28/2023.  Plan to obtain the following lab work today for further evaluation. May consider low-dose amlodipine 2.5 mg 1 tablet daily if her symptoms persist or worsen. - Plan: Analyzer ANA IFA w/RFLX Titer/Pattern,Systemic Autoimmune Panel 1  Post-COVID histamine  intolerance: Symptoms initially started after being diagnosed with COVID-19 in November 2023.  Her symptoms include apnea, palpitations, runny nose, and diffuse itching.  She has had facial flushing and occasional hives.  Been evaluated by a gastroenterologist as well as an allergist.  She will also be establishing care with [facility] in July 2025.   She has tried Xolair  with inadequate response.  She has been following a low histamine  diet.  She is prescribed Singulair  and has been taking Allegra. She remains highly symptomatic and would like to go for a second opinion at [facility]  to rule out mast cell activation.   Rosacea - Under care of Dr. Myrtie Atkinson.   Other medical conditions are listed as follows:   Seasonal and perennial allergic rhinitis: Inadequate response to Xolair .  Currently prescribed Singulair .  She is also taking Allegra and using Atrovent  nasal spray.   Anxiety  Nickel allergy   Chronic urticaria  Acne vulgaris: She is taking spironolactone  as prescribed.    Orders: Orders Placed This Encounter  Procedures   Analyzer ANA IFA w/RFLX Titer/Pattern,Systemic Autoimmune Panel 1   C-reactive protein   Histone antibodies, IgG, blood   TSH   No orders of the defined types  were placed in this encounter.   Follow-Up Instructions: Return in about 2 weeks (around 12/19/2023).   Romayne Clubs, PA-C  Note - This record has been created using Dragon software.  Chart creation errors have been sought, but may not always  have been located. Such creation errors do not reflect on  the standard of medical care.

## 2023-12-05 ENCOUNTER — Other Ambulatory Visit: Payer: Self-pay

## 2023-12-05 ENCOUNTER — Encounter: Payer: Self-pay | Admitting: Physician Assistant

## 2023-12-05 ENCOUNTER — Ambulatory Visit: Attending: Physician Assistant | Admitting: Physician Assistant

## 2023-12-05 VITALS — BP 132/83 | HR 77 | Resp 13 | Ht 63.0 in | Wt 114.4 lb

## 2023-12-05 DIAGNOSIS — L700 Acne vulgaris: Secondary | ICD-10-CM

## 2023-12-05 DIAGNOSIS — I7300 Raynaud's syndrome without gangrene: Secondary | ICD-10-CM

## 2023-12-05 DIAGNOSIS — L719 Rosacea, unspecified: Secondary | ICD-10-CM | POA: Diagnosis not present

## 2023-12-05 DIAGNOSIS — R768 Other specified abnormal immunological findings in serum: Secondary | ICD-10-CM

## 2023-12-05 DIAGNOSIS — M2550 Pain in unspecified joint: Secondary | ICD-10-CM

## 2023-12-05 DIAGNOSIS — L508 Other urticaria: Secondary | ICD-10-CM

## 2023-12-05 DIAGNOSIS — R21 Rash and other nonspecific skin eruption: Secondary | ICD-10-CM

## 2023-12-05 DIAGNOSIS — J3089 Other allergic rhinitis: Secondary | ICD-10-CM

## 2023-12-05 DIAGNOSIS — U099 Post covid-19 condition, unspecified: Secondary | ICD-10-CM

## 2023-12-05 DIAGNOSIS — J302 Other seasonal allergic rhinitis: Secondary | ICD-10-CM

## 2023-12-05 DIAGNOSIS — R5383 Other fatigue: Secondary | ICD-10-CM

## 2023-12-05 DIAGNOSIS — Z9109 Other allergy status, other than to drugs and biological substances: Secondary | ICD-10-CM

## 2023-12-05 DIAGNOSIS — F419 Anxiety disorder, unspecified: Secondary | ICD-10-CM

## 2023-12-05 NOTE — Progress Notes (Unsigned)
 Please review and sign pended referral to [facility] . Thanks!

## 2023-12-06 NOTE — Progress Notes (Signed)
 CRP WNL TSH WNL

## 2023-12-08 ENCOUNTER — Encounter: Payer: Self-pay | Admitting: Dermatology

## 2023-12-09 ENCOUNTER — Encounter: Payer: Self-pay | Admitting: Family Medicine

## 2023-12-09 DIAGNOSIS — M25551 Pain in right hip: Secondary | ICD-10-CM

## 2023-12-09 NOTE — Progress Notes (Signed)
 Office Visit Note  Patient: Marie Hanson             Date of Birth: [birth year]           MRN: [id number]             PCP: Everlina Hock, NP Referring: Everlina Hock, NP Visit Date: 12/18/2023 Occupation: @GUAROCC @  Subjective:  Positive ANA, joint pain  History of Present Illness: Sybel Kostiuk is a 43 y.o. female with polyarthralgia, positive ANA.  She returns today after her last visit in April 2025.  She had COVID-19 virus infection October 12, 2023.  She started having with symptoms after that.  She states she continues to have pain and discomfort in her right hip, right knee and right ankle.  She also has some discomfort in her right wrist.  There is no history of joint swelling.  Denies history of fatigue, oral ulcers, nasal ulcers, malar rash or lymphadenopathy.  She continues to have dry eyes, Raynauds, photosensitivity.  She has been using sunscreen on a regular basis.    Activities of Daily Living:  Patient reports morning stiffness for 2-3 hours.   Patient Denies nocturnal pain.  Difficulty dressing/grooming: Denies Difficulty climbing stairs: Denies Difficulty getting out of chair: Denies Difficulty using hands for taps, buttons, cutlery, and/or writing: Denies  Review of Systems  Constitutional:  Negative for fatigue.  HENT:  Negative for mouth sores and mouth dryness.   Eyes:  Positive for dryness.  Respiratory:  Negative for shortness of breath.   Cardiovascular:  Positive for palpitations. Negative for chest pain.  Gastrointestinal:  Negative for blood in stool, constipation and diarrhea.  Endocrine: Negative for increased urination.  Genitourinary:  Negative for involuntary urination.  Musculoskeletal:  Positive for joint pain, gait problem, joint pain, myalgias, muscle weakness, morning stiffness, muscle tenderness and myalgias. Negative for joint swelling.  Skin:  Positive for color change and sensitivity to sunlight. Negative for rash and hair  loss.  Allergic/Immunologic: Negative for susceptible to infections.  Neurological:  Negative for dizziness and headaches.  Hematological:  Negative for swollen glands.  Psychiatric/Behavioral:  Positive for depressed mood and sleep disturbance. The patient is nervous/anxious.     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   Hot flashes 07/26/2023   Vitamin B12 deficiency 06/20/2023   Flushing 06/03/2023   Chronic urticaria 03/18/2023   Acne vulgaris 03/18/2023   Seasonal and perennial allergic rhinitis 10/24/2022   Allergic conjunctivitis of both eyes 10/24/2022   Nickel allergy  10/24/2022   Raynaud's phenomenon 06/18/2022   Rosacea 03/05/2019   Anxiety 03/05/2019    Past Medical History:  Diagnosis Date   Anal fissure    Anxiety    Cardiac arrhythmia    during chidlhood   Depression    Eczema    Nipple pain x's 2 months   Rosacea    Urticaria     Family History  Problem Relation Age of Onset   Urticaria Mother    Eczema Mother    Allergic rhinitis Mother    Diabetes Mother    Colon polyps Mother    Irritable bowel syndrome Mother    Hyperlipidemia Mother    Depression Father    Anxiety disorder Father    BRCA 1/2 Neg Hx    Breast cancer Neg Hx    Colitis Neg Hx    Esophageal cancer Neg Hx    Pancreatic  cancer Neg Hx    Stomach cancer Neg Hx    Past Surgical History:  Procedure Laterality Date   AUGMENTATION MAMMAPLASTY Bilateral    breast enhancement & removal   BREAST IMPLANT REMOVAL Bilateral    CYST REMOVAL HAND Left 2025   ganglion cyst   REFRACTIVE SURGERY Bilateral    Social History   Social History Narrative   Not on file   Immunization History  Administered Date(s) Administered   Influenza, High Dose Seasonal PF 03/31/2022   Influenza, Seasonal, Injecte, Preservative Fre 05/11/2023   Influenza,inj,Quad PF,6+ Mos 03/28/2019   Influenza-Unspecified 04/03/2019, 04/20/2020,  04/27/2021   Novavax(Covid-19) Vaccine 08/29/2023   PFIZER(Purple Top)SARS-COV-2 Vaccination 11/12/2019, 12/07/2019, 07/01/2020   Tdap 03/05/2019     Objective: Vital Signs: BP 117/80 (BP Location: Left Arm, Patient Position: Sitting, Cuff Size: Normal)   Pulse 78   Resp 16   Ht 5\' 3"  (1.6 m)   Wt 114 lb 12.8 oz (52.1 kg)   BMI 20.34 kg/m    Physical Exam Vitals and nursing note reviewed.  Constitutional:      Appearance: She is well-developed.  HENT:     Head: Normocephalic and atraumatic.  Eyes:     Conjunctiva/sclera: Conjunctivae normal.  Cardiovascular:     Rate and Rhythm: Normal rate and regular rhythm.     Heart sounds: Normal heart sounds.  Pulmonary:     Effort: Pulmonary effort is normal.     Breath sounds: Normal breath sounds.  Abdominal:     General: Bowel sounds are normal.     Palpations: Abdomen is soft.  Musculoskeletal:     Cervical back: Normal range of motion.  Lymphadenopathy:     Cervical: No cervical adenopathy.  Skin:    General: Skin is warm and dry.     Capillary Refill: Capillary refill takes less than 2 seconds.  Neurological:     Mental Status: She is alert and oriented to person, place, and time.  Psychiatric:        Behavior: Behavior normal.      Musculoskeletal Exam: Cervical, thoracic, with good range of motion.  Shoulder joints, elbow joints, wrist joints, MCPs PIPs and DIPs with good range of motion with no synovitis.  Hip joints, knee joints, ankles, MTPs and PIPs with good range of motion with no synovitis.  CDAI Exam: CDAI Score: -- Patient Global: --; Provider Global: -- Swollen: --; Tender: -- Joint Exam 12/18/2023   No joint exam has been documented for this visit   There is currently no information documented on the homunculus. Go to the Rheumatology activity and complete the homunculus joint exam.  Investigation: No additional findings.  Imaging: No results found.  Recent Labs: Lab Results  Component Value  Date   WBC 9.8 11/28/2023   HGB 14.9 11/28/2023   PLT 231 11/28/2023   NA 134 (L) 12/16/2023   K 4.2 12/16/2023   CL 99 12/16/2023   CO2 25 12/16/2023   GLUCOSE 95 12/16/2023   BUN 23 12/16/2023   CREATININE 0.81 12/16/2023   BILITOT 0.3 11/28/2023   ALKPHOS 54 10/20/2023   AST 14 11/28/2023   ALT 23 11/28/2023   PROT 6.9 11/28/2023   ALBUMIN 3.9 10/20/2023   CALCIUM 8.9 12/16/2023   08/05/2024 ANA 1: 320 nuclear speckled, ENA (dsDNA, chromatin, Smith, RNP, SSA, SSB, SCL 70, Jo 1, centromere, anticardiolipin, beta-2 GP 1) negative, C3-C4 normal, RF IgM 28, anti-CCP negative, MCV negative, TPO negative  Speciality Comments: No specialty comments  available.  Procedures:  No procedures performed Allergies: Benzoyl peroxide   Assessment / Plan:     Visit Diagnoses: Polyarthralgia - Patient presents today with increased joint pain and joint stiffness exacerbated by most recent COVID-19 infection on 10/12/2023.  She continues to have discomfort in her right wrist, right hip, right knee and right ankle.  No synovitis was noted on the examination.  Her ANA was positive and rheumatoid factor was borderline positive IgM was positive.  Plan repeat rheumatoid factor in 6 months.- Plan: Rheumatoid factor  Positive ANA (antinuclear antibody) - 11/28/23: ANA 1:320 NH, ESR WNL, RF negative. 03/12/23: ANA negative, RF-, Ro-, ESR and CRP WNL - 08/05/2024 ANA 1: 320 nuclear speckled, ENA (dsDNA, chromatin, Smith, RNP, SSA, SSB, SCL 70, Jo 1, centromere, anticardiolipin, beta-2 GP 1) negative, C3-C4 normal, RF IgM 28, anti-CCP negative, MCV negative, TPO negative.  Lab results were discussed with the patient at length.  Patient continues to have fatigue and some arthralgias.  She also gives history of raynaud's phenomenon.  No synovitis was noted on the examination.  Patient states she may want to try an immunosuppressive agent.  I discussed the possible use of hydroxychloroquine as a trial for the 3 months.   Side effects of Plaquenil were discussed at length.  Patient is hesitant to go on the medication until she reviews more information.  A handout on Plaquenil was given.  I also took a consent in case she wants to start on Plaquenil.  Her dose of Plaquenil will be 200 mg p.o. daily and if she wants to try for at least 3 months to see if she gets any benefit from the medication.  She was advised a baseline eye examination and annual eye examination to monitor for eye toxicity.  If she starts Plaquenil we will check labs in a month, 3 months and then every 5 months.  At this point we decided to repeat labs in 6 months.  Plan: Protein / creatinine ratio, urine, CBC with Differential/Platelet, Comprehensive metabolic panel with GFR, ANA, Anti-DNA antibody, double-stranded, C3 and C4, Sedimentation rate  Patient was counseled on the purpose, proper use, and adverse effects of hydroxychloroquine including nausea/diarrhea, skin rash, headaches, and sun sensitivity.  Advised patient to wear sunscreen once starting hydroxychloroquine to reduce risk of rash associated with sun sensitivity.  Discussed importance of annual eye exams while on hydroxychloroquine to monitor to ocular toxicity and discussed importance of frequent laboratory monitoring.  Provided patient with eye exam form for baseline ophthalmologic exam.  Provided patient with educational materials on hydroxychloroquine and answered all questions.  Patient consented to hydroxychloroquine. Will upload consent in the media tab.    Reviewed risk for QTC prolongation when used in combination with other QTc prolonging agents (including but not limited to antiarrhythmics, macrolide antibiotics, flouroquinolone antibiotics, haloperidol, quetiapine, olanzapine, risperidone, droperidol, ziprasidone, amitriptyline, citalopram, ondansetron , migraine triptans, and methadone).   Dose will be Plaquenil 200 mg once daily.  Prescription pending lab results.   Facial  rash-she notices redness on her face.  Mild facial erythema was noted.  Patient states she gets histamine  response.  Other fatigue-she continues to have chronic fatigue.  Raynaud's phenomenon without gangrene -she has Raynaud's phenomenon for many years which involves her hands and feet.  Her her symptoms were exacerbated by recent COVID-19 infection in March 2025.  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  Rosacea - Under care of Dr. Myrtie Atkinson.  Seasonal and perennial allergic rhinitis - Inadequate response to Xolair .  Currently prescribed Singulair .  She is also taking Allegra and using Atrovent  nasal spray.  Anxiety-patient may be starting an antidepressant.  I also discussed that Plaquenil may interfere with the antidepressants.  Plaquenil can cause prolongation of QT interval.  Nickel allergy   Chronic urticaria  Acne vulgaris - She is taking spironolactone  as prescribed.    Orders: Orders Placed This Encounter  Procedures   Protein / creatinine ratio, urine   CBC with Differential/Platelet   Comprehensive metabolic panel with GFR   ANA   Anti-DNA antibody, double-stranded   C3 and C4   Sedimentation rate   Rheumatoid factor   No orders of the defined types were placed in this encounter.    Follow-Up Instructions: Return in about 6 months (around 06/19/2024) for Positive ANA, arthralgias.   Nicholas Bari, MD  Note - This record has been created using Animal nutritionist.  Chart creation errors have been sought, but may not always  have been located. Such creation errors do not reflect on  the standard of medical care.

## 2023-12-09 NOTE — Telephone Encounter (Signed)
 Marie Gory,  Do you know the best way to go about ordering this and which facility should be selected?  Thanks!

## 2023-12-10 NOTE — Telephone Encounter (Signed)
 Spoke with Dr. Peggy Bowens at [facility], he suggested 3T MRI if we are unable to find location that can do scan with preservative-free contrast.   [facility] and spoke with MR tech, unable to verify if they are able to do MR Arthrogram with preservative-free contrast.  Spoke with Dr. Alease Hunter and advised.

## 2023-12-10 NOTE — Progress Notes (Signed)
 Anti-histone antibody negative

## 2023-12-10 NOTE — Telephone Encounter (Signed)
 Called Outpatient Imaging at [facility] and left VM requesting a call back to discuss contrast for MRI.

## 2023-12-11 NOTE — Progress Notes (Addendum)
 [address] [place] [address] Dept: [phone]  FOLLOW UP NOTE  Patient ID: Marie Hanson, female    DOB: 1981/01/07  Age: 43 y.o. MRN: [id number] Date of Office Visit: 12/12/2023  Assessment  Chief Complaint: Other (On low histamine  diet x 9 months . S/p covid for 2nd time march 2025. Still has symptoms itchy, runny nose, acne, and joint pain. Denies breathing issues. Noticed GI issues avoids all dairy.)  HPI Marie Hanson is a 43 year old female who presents today for follow-up of chronic urticaria, flushing, acne, allergic rhinitis and conjunctivitis.  She was last seen on June 03, 2023 by Dr. Cornel Diesel.  She reports that since her last office visit she had COVID-19 for the second time in March and had surgery to remove a cyst in her left index finger.  She reports since having COVID-19 and taking Paxlovid in March she feels like it has caused her to have stomach problems after eating.  She will have severe cramping in her lower stomach and  in her back.  She has gone to urgent care 2 times for these symptoms.  She mentions that her symptoms are better now, but happens once a day.  The symptoms last for about 20 minutes.  She mentions that when she recently was in [place]  visiting family that she did not have any of the symptoms and was able to eat any food that she wanted to.  She ate at [employer].  She has seen her OB/GYN doctor and had ultrasound that she reports did not show anything.  She has also seen GI for the symptoms who said it could be COVID causing this and was instructed to eat more fiber.  She has an appointment to do a stool map.  She is trying to introduce new foods such as beef, almonds, and normal healthy foods such as vegetables.   Chronic urticaria: She reports since stopping Xolair  she has not noticed any difference in her joint pain.  She feels like her whole body is inflamed and her joints hurt.  She has seen rheumatology and has a  follow-up on May 7.  She reports that the testing shows possible lupus, but she is not sure yet.  Her rheumatologist recommended that she see an mast cell activation system specialist at [facility] and they have made a referral.She reports that she just has to make the appointment.  She has tried cromolyn  sodium in the past and had severe diarrhea. She wonders if ketotifen compounded could be helpful.  Discussed how cromolyn  sodium and ketotifen are in the same drug class .  Discussed her lab work to check for MCAS showed: normal tryptase, normal urine leukotriene, and normal serum prostaglandin.  Her c-kit showed no mutation found. She is currently taking Zyrtec  in the morning and Xyzal at night.  She would like to try Clarinex .  She has tried Pepcid  in the past and felt like it hurt her stomach more.  Flushing: She reports that taking a baby aspirin 81 mg does help some with the flushing, but she does not take it every day.She has been on a low histamine  diet for 9 months and mentions that this has helped a bit.  Acne: She continues to follow-up with her dermatologist.  She reports that she has had to decrease her spironolactone  due to being dehydrated.  She has an upcoming appointment in June to discuss Accutane.  She mentions she has not sure if she has real acne.  Allergic rhinitis: She reports runny nose.  She is currently taking Xyzal at night and Zyrtec  in the morning.  She has previously been on allergy  injections, but stopped due to feeling like it caused acne.  Pertinent History/Diagnostics:  Allergic Rhinitis:  Congestion and pain, worse on right. + itchy eyes with occasional drainage. Recent ENT visit with normal rhinoscopy. Moved to  3.5 years prior. Symptoms worse in spring and fall.  Horse has been a trigger.  Has history of nasal cauterization. RUSH AIT on 11/13/22. Last injection 12/24/22-stopped due to acne which occurred near time of injections as well as localized reactions despite epi  wash and extra antihistamine. - SPT environmental panel (10/24/22): positive to grass pollen, weed pollen, tree pollen, indoor/outdoor molds, dog, horse, tobacco leaf -intradermal testing positive to cat and ragweed Eczema: occasionally, flares last year around her nipple. Normal mammogram. She did see a dermatologist and was given a non-steroid cream that didn't work. Eucerin urea  cream did work. She has also had a spot on her left eyelid, but none in over a year.  Chronic urticaria: Has random episodes of urticaria, unclear triggers.  - Food allergy  SPT 10/24/22-most common foods negative, obtained for patient reassurance. - Extensive food allergy  testing performed via bloodwork 01/30/23 per patient request: postive to hazelnut only (1.25, cor A8 2.02, A1 0.49), negative or below positive threshold (< 0.35) to peanuts, other tree nuts, sunflower seeds, wheat, egg white, oat, banana, rice, barley, chocolate, milk, flaxseed, apple, buckwheat - Patient returned 02/12/23, evaluated by Tinnie Forehand, FNP and additional lab work requested and completed: normal baseline tryptase 3.0, low level positive to grape (0.37), negative or below positive threshold of 0.35 to tomato, white potato, sweet potato, avocado, corn, yeast, green pea, coconut, broccoli, black bean - FU on 02/26/23: CBC, CMP and thyroid  studies normal - Extensive counseling on CIU and non-allergic nature provided. Patient now on allegra 180 mg BID, pepcid  10 mg BID, singulair  nightly. Xolair  has been offered. - patient planning on second opinion for hives at [facility] allergy . - evaluated by Derm 03/12/23: acne: well controlled, discontinued meds; petechia on forehead with normal cuticle exam. Plan: avoid triggering foods, continue oral AH, autoimmune labs ordered.  - Xolair  started 03/18/23 - 05/20/23: - Check C4, C1 esterase, serum 5-HIAA-reviewed  at that visit and normal (obtained by GI), normal fecal calprotectin and pancreatic  elastase - 06/03/23: reported joint pain with xoliar (temporary stop, awaiting Rheum eval). Reported GI symptoms improved off doxycycline for acne.  Still having facial flushing. Serum prostaglandin and urine leukotrienes normal. No c-Kit mutation found Concern for food allergy :  Hives when eating sushi - normally orders [place]  roll or similar Hives from veggie burger with red bell pepper Salmon-nausea - serum IgE sesame negative, paprika/green pepper negative, cod, halibut, pike, tuna, salmon, mackerel, trout negative - negative tTG 01/21/22 Other: acne-follows with dermatology  Drug Allergies:  Allergies  Allergen Reactions   Benzoyl Peroxide Dermatitis    Review of Systems: Negative except as per HPI   Physical Exam: BP 110/68   Pulse 77   Temp (!) 96.6 F (35.9 C) (Temporal)   Resp 20   Wt 114 lb 3.2 oz (51.8 kg)   SpO2 100%   BMI 20.23 kg/m    Physical Exam Constitutional:      Appearance: Normal appearance.     Comments: Tearful during a portion of the visit  HENT:     Head: Normocephalic and atraumatic.     Comments: Pharynx normal, eyes  normal, ears normal, nose normal    Right Ear: Tympanic membrane, ear canal and external ear normal.     Left Ear: Tympanic membrane, ear canal and external ear normal.     Nose: Nose normal.     Mouth/Throat:     Mouth: Mucous membranes are moist.     Pharynx: Oropharynx is clear.  Eyes:     Conjunctiva/sclera: Conjunctivae normal.  Cardiovascular:     Rate and Rhythm: Regular rhythm.     Heart sounds: Normal heart sounds.  Pulmonary:     Effort: Pulmonary effort is normal.     Breath sounds: Normal breath sounds.     Comments: Lungs clear to auscultation Musculoskeletal:     Cervical back: Neck supple.  Skin:    General: Skin is warm.     Comments: No rashes, erythema, or urticarial lesions noted.  Neurological:     Mental Status: She is alert and oriented to person, place, and time.  Psychiatric:        Mood  and Affect: Mood normal.        Behavior: Behavior normal.        Thought Content: Thought content normal.        Judgment: Judgment normal.     Diagnostics:  None  Assessment and Plan: 1. Flushing   2. Idiopathic urticaria   3. Seasonal and perennial allergic rhinitis     Meds ordered this encounter  Medications   desloratadine  (CLARINEX ) 5 MG tablet    Sig: Take 1 tablet (5 mg total) by mouth daily.    Dispense:  60 tablet    Refill:  1    Patient Instructions  Chronic Urticaria Patient previously reported joint pain possibly related to Xolair . Uncertain efficacy of Xolair  for urticaria control. Currently on Zyrtec  in the morning and Xyzal at night.  She reports worsening of joint pain since being off Xolair  -Discontinued Xolair  temporarily to assess for resolution of joint pain and evaluate its impact on urticaria control. -Consider trial of Hydroxyzine  for nighttime itching and flushing, with caution regarding potential cognitive effects. 25 mg nightly as needed - Stop Zyrtec  and Xyzal -Start Clarinex  (desloratadine ) 5 mg twice a day. Caution as it may make you sleepy  Flushing Occurs postprandially, primarily affecting the head and neck. No associated gastrointestinal symptoms. Patient has been on a low histamine  diet and has stopped Doxycycline. -Continue baby aspirin 81 mg daily to potentially reduce prostaglandin-mediated flushing. Can increase to twice daily if needed.  -Complete workup for Mast Cell Activation Syndrome (MCAS) despite normal tryptase level. Serum prostaglandin and urine leukotriene normal. No c-Kit mutation found. -Continue to follow up with  Rheumatology concerning joint pain  Acne. -Continue current management with dermatology.  Allergic rhinitis and conjunctivitis: Allergen avoidance -Continue Clarinex  as above  Agree with referral to [facility] for your symptoms.  Follow up :as needed   Return if symptoms worsen or fail to improve.    Thank  you for the opportunity to care for this patient.  Please do not hesitate to contact me with questions.  Tinnie Forehand, FNP [facility] 

## 2023-12-11 NOTE — Patient Instructions (Addendum)
 Chronic Urticaria Patient previously reported joint pain possibly related to Xolair . Uncertain efficacy of Xolair  for urticaria control. Currently on Zyrtec  in the morning and Xyzal at night.  She reports worsening of joint pain since being off Xolair  -Discontinued Xolair  temporarily to assess for resolution of joint pain and evaluate its impact on urticaria control. -Consider trial of Hydroxyzine  for nighttime itching and flushing, with caution regarding potential cognitive effects. 25 mg nightly as needed - Stop Zyrtec  and Xyzal -Start Clarinex  (desloratadine ) 5 mg twice a day. Caution as it may make you sleepy  Flushing Occurs postprandially, primarily affecting the head and neck. No associated gastrointestinal symptoms. Patient has been on a low histamine  diet and has stopped Doxycycline. -Continue baby aspirin 81 mg daily to potentially reduce prostaglandin-mediated flushing. Can increase to twice daily if needed.  -Complete workup for Mast Cell Activation Syndrome (MCAS) despite normal tryptase level. Serum prostaglandin and urine leukotriene normal. No c-Kit mutation found. -Continue to follow up with  Rheumatology concerning joint pain  Acne. -Continue current management with dermatology.  Allergic rhinitis and conjunctivitis: Allergen avoidance -Continue Clarinex  as above  Agree with referral to [facility] for your symptoms.  Follow up :as needed

## 2023-12-12 ENCOUNTER — Encounter: Payer: Self-pay | Admitting: Family

## 2023-12-12 ENCOUNTER — Other Ambulatory Visit: Payer: Self-pay

## 2023-12-12 ENCOUNTER — Ambulatory Visit: Admitting: Family

## 2023-12-12 ENCOUNTER — Encounter: Payer: Self-pay | Admitting: Rheumatology

## 2023-12-12 VITALS — BP 110/68 | HR 77 | Temp 96.6°F | Resp 20 | Wt 114.2 lb

## 2023-12-12 DIAGNOSIS — J302 Other seasonal allergic rhinitis: Secondary | ICD-10-CM

## 2023-12-12 DIAGNOSIS — J3089 Other allergic rhinitis: Secondary | ICD-10-CM | POA: Diagnosis not present

## 2023-12-12 DIAGNOSIS — L501 Idiopathic urticaria: Secondary | ICD-10-CM

## 2023-12-12 DIAGNOSIS — R232 Flushing: Secondary | ICD-10-CM | POA: Diagnosis not present

## 2023-12-12 LAB — ANALYZER(R)ANA IFA WITH REFLEX TITER/PATTRN,SYS AUTOIMM PNL1
Anticardiolipin IgA: <2 [APL'U]/mL
Anticardiolipin IgG: <2 [GPL'U]/mL
Anticardiolipin IgM: <2 [MPL'U]/mL
Beta-2 Glyco 1 IgA: <2 U/mL
Beta-2 Glyco 1 IgM: <2 U/mL
Beta-2 Glyco I IgG: <2 U/mL
C3 Complement: 150 mg/dL (ref 83–193)
C4 Complement: 30 mg/dL (ref 15–57)
Chromatin (Nucleosomal) Antibody: <2 [GPL'U]/mL
Cyclic Citrullin Peptide Ab: <16 U
DNA Ab (DS) Crithidia, IFA: NEGATIVE [APL'U]/mL
DNA Ab (DS) Crithidia, IFA: POSITIVE — AB
ENA SM Ab Ser-aCnc: <2 [MPL'U]/mL
MUTATED CITRULLINATED VIMENTIN (MCV) AB: <20 U/mL (ref <20)
Rheumatoid Factor (IgA): <5 U
Rheumatoid Factor (IgG): <5 U
Rheumatoid Factor (IgM): 28 U — ABNORMAL HIGH
Rheumatoid Factor (IgM): <1 U — ABNORMAL HIGH
Ribonucleic Protein(ENA) Antibody, IgG: <2 U/mL
SM/RNP: <2 U/mL
SSA (Ro) (ENA) Antibody, IgG: <2 U/mL
SSB (La) (ENA) Antibody, IgG: <5 U
Scleroderma (Scl-70) (ENA) Antibody, IgG: <5 U
Thyroperoxidase Ab SerPl-aCnc: <1 [IU]/mL (ref <9)
Thyroperoxidase Ab SerPl-aCnc: <1 [IU]/mL (ref <9)

## 2023-12-12 LAB — HISTONE ANTIBODIES, IGG, BLOOD: Histone Antibodies: <1 U (ref <1.0)

## 2023-12-12 LAB — C-REACTIVE PROTEIN: CRP: 5.7 mg/L (ref <8.0)

## 2023-12-12 LAB — ANTI-NUCLEAR AB-TITER (ANA TITER): ANA Titer 1: 1:320 {titer} — ABNORMAL HIGH

## 2023-12-12 LAB — TSH: TSH: 1.63 m[IU]/L

## 2023-12-12 MED ORDER — DESLORATADINE 5 MG PO TABS
5.0000 mg | ORAL_TABLET | Freq: Every day | ORAL | 1 refills | Status: DC
Start: 2023-12-12 — End: 2024-01-22

## 2023-12-13 NOTE — Progress Notes (Signed)
 ANA positive, rheumatoid factor positive, anticardiolipin negative, beta-2 GP 1 negative, anti-CCP negative, MCV negative, TPO negative.  Will discuss results at the follow-up visit.

## 2023-12-16 ENCOUNTER — Encounter: Payer: Self-pay | Admitting: Family Medicine

## 2023-12-16 ENCOUNTER — Other Ambulatory Visit (INDEPENDENT_AMBULATORY_CARE_PROVIDER_SITE_OTHER)

## 2023-12-16 DIAGNOSIS — E871 Hypo-osmolality and hyponatremia: Secondary | ICD-10-CM

## 2023-12-16 LAB — BASIC METABOLIC PANEL WITH GFR
BUN: 23 mg/dL (ref 6–23)
CO2: 25 meq/L (ref 19–32)
Calcium: 8.9 mg/dL (ref 8.4–10.5)
Chloride: 99 meq/L (ref 96–112)
Creatinine, Ser: 0.81 mg/dL (ref 0.40–1.20)
GFR: 89.94 mL/min (ref >60.00)
Glucose, Bld: 95 mg/dL (ref 70–99)
Potassium: 4.2 meq/L (ref 3.5–5.1)
Sodium: 134 meq/L — ABNORMAL LOW (ref 135–145)

## 2023-12-17 ENCOUNTER — Encounter: Payer: Self-pay | Admitting: Family Medicine

## 2023-12-17 ENCOUNTER — Other Ambulatory Visit

## 2023-12-18 ENCOUNTER — Ambulatory Visit: Attending: Rheumatology | Admitting: Rheumatology

## 2023-12-18 ENCOUNTER — Encounter: Payer: Self-pay | Admitting: Rheumatology

## 2023-12-18 ENCOUNTER — Telehealth: Payer: Self-pay

## 2023-12-18 VITALS — BP 117/80 | HR 78 | Resp 16 | Ht 63.0 in | Wt 114.8 lb

## 2023-12-18 DIAGNOSIS — L700 Acne vulgaris: Secondary | ICD-10-CM

## 2023-12-18 DIAGNOSIS — R21 Rash and other nonspecific skin eruption: Secondary | ICD-10-CM

## 2023-12-18 DIAGNOSIS — M2550 Pain in unspecified joint: Secondary | ICD-10-CM | POA: Diagnosis not present

## 2023-12-18 DIAGNOSIS — J302 Other seasonal allergic rhinitis: Secondary | ICD-10-CM

## 2023-12-18 DIAGNOSIS — R5383 Other fatigue: Secondary | ICD-10-CM

## 2023-12-18 DIAGNOSIS — F419 Anxiety disorder, unspecified: Secondary | ICD-10-CM

## 2023-12-18 DIAGNOSIS — R768 Other specified abnormal immunological findings in serum: Secondary | ICD-10-CM | POA: Diagnosis not present

## 2023-12-18 DIAGNOSIS — U099 Post covid-19 condition, unspecified: Secondary | ICD-10-CM

## 2023-12-18 DIAGNOSIS — J3089 Other allergic rhinitis: Secondary | ICD-10-CM

## 2023-12-18 DIAGNOSIS — I7300 Raynaud's syndrome without gangrene: Secondary | ICD-10-CM

## 2023-12-18 DIAGNOSIS — L508 Other urticaria: Secondary | ICD-10-CM

## 2023-12-18 DIAGNOSIS — Z9109 Other allergy status, other than to drugs and biological substances: Secondary | ICD-10-CM

## 2023-12-18 DIAGNOSIS — L719 Rosacea, unspecified: Secondary | ICD-10-CM

## 2023-12-18 NOTE — Telephone Encounter (Signed)
 Noted.

## 2023-12-18 NOTE — Patient Instructions (Addendum)
 Standing Labs We placed an order today for your standing lab work.   Please have your standing labs drawn in 1 month after starting plaquenil, 3 months and then every 5 months.  Please have your labs drawn 2 weeks prior to your appointment so that the provider can discuss your lab results at your appointment, if possible.  Please note that you may see your imaging and lab results in MyChart before we have reviewed them. We will contact you once all results are reviewed. Please allow our office up to 72 hours to thoroughly review all of the results before contacting the office for clarification of your results.  WALK-IN LAB HOURS  Monday through Thursday from 8:00 am -12:30 pm and 1:00 pm-5:00 pm and Friday from 8:00 am-12:00 pm.  Patients with office visits requiring labs will be seen before walk-in labs.  You may encounter longer than normal wait times. Please allow additional time. Wait times may be shorter on  Monday and Thursday afternoons.  We do not book appointments for walk-in labs. We appreciate your patience and understanding with our staff.   Labs are drawn by [facility]. Please bring your co-pay at the time of your lab draw.  You may receive a bill from [facility] for your lab work.  Please note if you are on Hydroxychloroquine and and an order has been placed for a Hydroxychloroquine level,  you will need to have it drawn 4 hours or more after your last dose.  If you wish to have your labs drawn at another location, please call the office 24 hours in advance so we can fax the orders.  The office is located at [address], [place], [place] [address]   If you have any questions regarding directions or hours of operation,  please call [phone].   As a reminder, please drink plenty of water prior to coming for your lab work. Thanks!   Hydroxychloroquine Tablets What is this medication? HYDROXYCHLOROQUINE (hye drox ee KLOR oh kwin) treats autoimmune conditions, such  as rheumatoid arthritis and lupus. It works by slowing down an overactive immune system. It may also be used to prevent and treat malaria. It works by killing the parasite that causes malaria. It belongs to a group of medications called DMARDs. This medicine may be used for other purposes; ask your health care provider or pharmacist if you have questions. COMMON BRAND NAME(S): Plaquenil, Quineprox, SOVUNA What should I tell my care team before I take this medication? They need to know if you have any of these conditions: Diabetes Eye disease, vision problems Frequently drink alcohol G6PD deficiency Heart disease Irregular heartbeat or rhythm Kidney disease Liver disease Porphyria Psoriasis An unusual or allergic reaction to hydroxychloroquine, other medications, foods, dyes, or preservatives Pregnant or trying to get pregnant Breastfeeding How should I use this medication? Take this medication by mouth with water. Take it as directed on the prescription label. Do not cut, crush, or chew this medication. Swallow the tablets whole. Take it with food. Do not take it more than directed. Take all of this medication unless your care team tells you to stop it early. Keep taking it even if you think you are better. Take products with antacids in them at a different time of day than this medication. Take this medication 4 hours before or 4 hours after antacids. Talk to your care team if you have questions. Talk to your care team about the use of this medication in children. While this  medication may be prescribed for selected conditions, precautions do apply. Overdosage: If you think you have taken too much of this medicine contact a poison control center or emergency room at once. NOTE: This medicine is only for you. Do not share this medicine with others. What if I miss a dose? If you miss a dose, take it as soon as you can. If it is almost time for your next dose, take only that dose. Do not take  double or extra doses. What may interact with this medication? Do not take this medication with any of the following: Cisapride Dronedarone Pimozide Thioridazine This medication may also interact with the following: Ampicillin Antacids Cimetidine Cyclosporine Digoxin Kaolin Medications for diabetes, such as insulin, glipizide, glyburide Medications for seizures, such as carbamazepine, phenobarbital, phenytoin Mefloquine Methotrexate Other medications that cause heart rhythm changes Praziquantel This list may not describe all possible interactions. Give your health care provider a list of all the medicines, herbs, non-prescription drugs, or dietary supplements you use. Also tell them if you smoke, drink alcohol, or use illegal drugs. Some items may interact with your medicine. What should I watch for while using this medication? Visit your care team for regular checks on your progress. Tell your care team if your symptoms do not start to get better or if they get worse. You may need blood work done while you are taking this medication. If you take other medications that can affect heart rhythm, you may need more testing. Talk to your care team if you have questions. Your vision may be tested before and during use of this medication. Tell your care team right away if you have any change in your eyesight. This medication may cause serious skin reactions. They can happen weeks to months after starting the medication. Contact your care team right away if you notice fevers or flu-like symptoms with a rash. The rash may be red or purple and then turn into blisters or peeling of the skin. Or, you might notice a red rash with swelling of the face, lips or lymph nodes in your neck or under your arms. If you or your family notice any changes in your behavior, such as new or worsening depression, thoughts of harming yourself, anxiety, or other unusual or disturbing thoughts, or memory loss, call your  care team right away. What side effects may I notice from receiving this medication? Side effects that you should report to your care team as soon as possible: Allergic reactions--skin rash, itching, hives, swelling of the face, lips, tongue, or throat Aplastic anemia--unusual weakness or fatigue, dizziness, headache, trouble breathing, increased bleeding or bruising Change in vision Heart rhythm changes--fast or irregular heartbeat, dizziness, feeling faint or lightheaded, chest pain, trouble breathing Infection--fever, chills, cough, or sore throat Low blood sugar (hypoglycemia)--tremors or shaking, anxiety, sweating, cold or clammy skin, confusion, dizziness, rapid heartbeat Muscle injury--unusual weakness or fatigue, muscle pain, dark yellow or brown urine, decrease in amount of urine Pain, tingling, or numbness in the hands or feet Rash, fever, and swollen lymph nodes Redness, blistering, peeling, or loosening of the skin, including inside the mouth Thoughts of suicide or self-harm, worsening mood, or feelings of depression Unusual bruising or bleeding Side effects that usually do not require medical attention (report to your care team if they continue or are bothersome): Diarrhea Headache Nausea Stomach pain Vomiting This list may not describe all possible side effects. Call your doctor for medical advice about side effects. You may report side effects to  FDA at 1-800-FDA-1088. Where should I keep my medication? Keep out of the reach of children and pets. Store at room temperature up to 30 degrees C (86 degrees F). Protect from light. Get rid of any unused medication after the expiration date. To get rid of medications that are no longer needed or have expired: Take the medication to a medication take-back program. Check with your pharmacy or law enforcement to find a location. If you cannot return the medication, check the label or package insert to see if the medication should be  thrown out in the garbage or flushed down the toilet. If you are not sure, ask your care team. If it is safe to put it in the trash, empty the medication out of the container. Mix the medication with cat litter, dirt, coffee grounds, or other unwanted substance. Seal the mixture in a bag or container. Put it in the trash. NOTE: This sheet is a summary. It may not cover all possible information. If you have questions about this medicine, talk to your doctor, pharmacist, or health care provider.  2024 Elsevier/Gold Standard (2022-02-05 00:00:00)

## 2023-12-18 NOTE — Telephone Encounter (Signed)
 Patient was consented on plaquenil today and will decide if she is wanting to start plaquenil. She will contact the office with decision and we can send rx then.   Consent form sent to the scan center.

## 2023-12-27 NOTE — Progress Notes (Signed)
   I, Miquel Amen, CMA acting as a scribe for Garlan Juniper, MD.  Marie Hanson is a 43 y.o. female who presents to [facility] today for chronic right ankle and leg pain, present for > 1 year. MRI L-spine was completed 11/27/22 showing pinched nerve at L4-L5. Pt had minimal relief with ESI completed on 12/11/23. Also minimal relief with PT in the past. Pt was last seen by Dr. Alease Hunter on 09/12/23, Gabapentin  was prescribed, and MR Arthrogram was ordered, not yet performed.   Today, patient reports unable to get MR Arthrogram d/t her and her husbands health issues. Reports continued pain in the right hip radiating to the ankle. Also having numbness at the proximal hamstring. Feels that the right side continues to become weaker d/t muscle not firing.  Notes that n/t in the ankle improves when she is able to pop the joint, same applies to the inner thigh.   She notes stiffness and pain in the right foot and ankle.  As noted above she is able to get a big pop out of it and make it feel better.  She did have initial evaluation and treatment with a podiatrist and had a MRI of the right foot earlier this year.  That was unrevealing.  She has not had any ankle imaging.  She has been working on home exercise program since she saw the podiatrist since at least January 2025 and over the last 6 weeks has been completing home exercise program.  This has not helped.  Pertinent review of systems: No fevers or chills  Relevant historical information: Raynaud's phenomenon   Exam:  BP 106/80   Pulse 64   Ht 5\' 3"  (1.6 m)   Wt 116 lb (52.6 kg)   SpO2 99%   BMI 20.55 kg/m  General: Well Developed, well nourished, and in no acute distress.   MSK: Right hip normal-appearing normal motion palpable click.  Right ankle normal-appearing little motion audible and palpable click.  Intact strength.    Lab and Radiology Results  X-ray images right ankle obtained today  personally and independently interpreted. No acute fractures.  No significant degenerative changes. Await formal radiology review    Assessment and Plan: 43 y.o. female with chronic right hip pain with mechanical popping clunk.  Will proceed with MRI arthrogram.  This originally was ordered a few months ago but not completed.  One of her concerns is the preservative in the gadolinium contrast.  Will switch location to [facility] where Dr. Sandy Crumb can do an arthrogram injection without any preservatives in the gadolinium contrast.  Right ankle mechanical popping and clicking.  Plan for MRI ankle.  Recheck once both MRIs are back.   PDMP not reviewed this encounter. Orders Placed This Encounter  Procedures   DG Ankle Complete Right    Standing Status:   Future    Number of Occurrences:   1    Expiration Date:   12/29/2024    Reason for Exam (SYMPTOM  OR DIAGNOSIS REQUIRED):   eval ankle pain    Is patient pregnant?:   No    Preferred imaging location?:   [facility]   No orders of the defined types were placed in this encounter.    Discussed warning signs or symptoms. Please see discharge instructions. Patient expresses understanding.   The above documentation has been reviewed and is accurate and complete Garlan Juniper, M.D.

## 2023-12-30 ENCOUNTER — Encounter: Payer: Self-pay | Admitting: Family Medicine

## 2023-12-30 ENCOUNTER — Ambulatory Visit: Admitting: Family Medicine

## 2023-12-30 ENCOUNTER — Ambulatory Visit (INDEPENDENT_AMBULATORY_CARE_PROVIDER_SITE_OTHER)

## 2023-12-30 VITALS — BP 106/80 | HR 64 | Ht 63.0 in | Wt 116.0 lb

## 2023-12-30 DIAGNOSIS — G8929 Other chronic pain: Secondary | ICD-10-CM | POA: Diagnosis not present

## 2023-12-30 DIAGNOSIS — M25571 Pain in right ankle and joints of right foot: Secondary | ICD-10-CM | POA: Diagnosis not present

## 2023-12-30 DIAGNOSIS — M25551 Pain in right hip: Secondary | ICD-10-CM | POA: Diagnosis not present

## 2023-12-30 NOTE — Patient Instructions (Addendum)
 Thank you for coming in today.   Call [facility] to schedule your MR Arthrogram.   Clemente Cushing also placed an order for the MRI of your ankle.   See you back to review results in person.

## 2024-01-01 ENCOUNTER — Other Ambulatory Visit

## 2024-01-01 ENCOUNTER — Ambulatory Visit (INDEPENDENT_AMBULATORY_CARE_PROVIDER_SITE_OTHER): Admitting: Gastroenterology

## 2024-01-01 ENCOUNTER — Encounter: Payer: Self-pay | Admitting: Gastroenterology

## 2024-01-01 VITALS — BP 110/60 | HR 76 | Ht 62.5 in | Wt 114.4 lb

## 2024-01-01 DIAGNOSIS — R140 Abdominal distension (gaseous): Secondary | ICD-10-CM | POA: Diagnosis not present

## 2024-01-01 DIAGNOSIS — R194 Change in bowel habit: Secondary | ICD-10-CM

## 2024-01-01 DIAGNOSIS — R1084 Generalized abdominal pain: Secondary | ICD-10-CM

## 2024-01-01 NOTE — Progress Notes (Signed)
 Chief Complaint:follow up abd pain Primary GI Doctor:Dr. Rosaline Hanson  HPI: 43 year old female with history of eczema, anxiety, and depression presents with food intolerance.   Patient has been experiencing flushing, hives, and diarrhea since 08/2022.  Acne has been worse as well.  She was treated with allergy  shots since her symptoms seem to worsen slightly.  She does find that Xolair  seems to help with her symptoms.  Since she has been following with the low histamine  diet for the last 4 months, she does think that her symptoms overall have improved.  On average she has 1 stool per day.  Denies any nocturnal stools.  Denies blood in the stools.  Endorses abdominal cramping in the lower abdomen that will just go away on its own with time.  Endorses nausea.  Denies vomiting.  Denies dysphagia or acid reflux.  She is on doxycycline for dry eyes which also seems to help with her acne as well.  She went to a holistic provider who performed a panel of labs that showed a negative GI pathogen panel, moderately elevated fecal calprotectin, and mildly low fecal elastase.  Patient denies any history of pancreatitis.   Lab work completed: C4, C1 esterase, serum 5-HIAA- all negative (r/o abdominal angioedema ) urine porphobilinogen-normal (r/o  porphyria ) Vitamin B 12, folate- normal, iron slightly low at 78, ferritin 44.5- encouraged multivitamin with iron fecal calprotectin-19 fecal elastase-303 07/02/23 Sibo test negative  Interval History   Patient presents for main complaint of lower abdominal cramping about 3 days a week. She has recently had more issues with constipation where she has small pebbles. She added physllium husk 1 tsp which caused bloating and she also started daily OTC Miralax half dose last two days which has helped some. No blood in stool.      Patient on spirolactone for acne, having issues with low sodium levels and hydration therefore they have made adjustments. She feels this Marie Hanson have  affected her bowel movements.   She also tells me she was recently seen by Rheumatology for polyarthralgia, positive ANA and Rheumatoid factor was borderline positive IgM was positive. They discussed possibly starting Plaquenil, however patient states due to SE profile she was holding off.   Wt Readings from Last 3 Encounters:  01/01/24 114 lb 6 oz (51.9 kg)  12/30/23 116 lb (52.6 kg)  12/18/23 114 lb 12.8 oz (52.1 kg)     Past Medical History:  Diagnosis Date   Anal fissure    Anxiety    Cardiac arrhythmia    during chidlhood   Depression    Eczema    Nipple pain x's 2 months   Rosacea    Urticaria     Past Surgical History:  Procedure Laterality Date   AUGMENTATION MAMMAPLASTY Bilateral    breast enhancement & removal   BREAST IMPLANT REMOVAL Bilateral    CYST REMOVAL HAND Left 2025   ganglion cyst   REFRACTIVE SURGERY Bilateral     Current Outpatient Medications  Medication Sig Dispense Refill   Adapalene 0.3 % gel Apply 1 Application topically at bedtime.     Azelaic Acid  15 % gel AFTER SKIN IS THOROUGHLY WASHED AND PATTED DRY, GENTLY AND THOROUGHLY APPLY A THIN FILM TO AFFECTED AREA IN THE MORNING AND AT NIGHT 50 g 2   desloratadine  (CLARINEX ) 5 MG tablet Take 1 tablet (5 mg total) by mouth daily. 60 tablet 1   ibuprofen (ADVIL) 200 MG tablet Take 200 mg by mouth every  6 (six) hours as needed for mild pain (pain score 1-3).     ipratropium (ATROVENT ) 0.03 % nasal spray Place 2 sprays into both nostrils every 12 (twelve) hours. 30 mL 3   metroNIDAZOLE (METROGEL) 0.75 % gel Apply 1 Application topically daily.     MIEBO 1.338 GM/ML SOLN Apply to eye.     montelukast  (SINGULAIR ) 10 MG tablet TAKE 1 TABLET BY MOUTH EVERYDAY AT BEDTIME 90 tablet 1   Multiple Vitamin (MULTIVITAMIN) tablet Take 1 tablet by mouth daily.     RESTASIS 0.05 % ophthalmic emulsion      spironolactone  (ALDACTONE ) 50 MG tablet TAKE 3 TABLETS BY MOUTH EVERY DAY 270 tablet 4   Tretinoin  (ALTRENO )  0.05 % LOTN Use pea size amount on face 1 time a week at night. 45 g 0   YASMIN  28 3-0.03 MG tablet Take 1 tablet by mouth daily. 84 tablet 4   EPINEPHrine  0.3 mg/0.3 mL IJ SOAJ injection  (Patient not taking: Reported on 01/01/2024)     hydrOXYzine  (ATARAX ) 25 MG tablet Take 25 mg by mouth at bedtime as needed. (Patient not taking: Reported on 01/01/2024)     Current Facility-Administered Medications  Medication Dose Route Frequency Provider Last Rate Last Admin   omalizumab  (XOLAIR ) prefilled syringe 300 mg  300 mg Subcutaneous Q28 days Marie Czar, MD   300 mg at 05/14/23 1025    Allergies as of 01/01/2024 - Review Complete 01/01/2024  Allergen Reaction Noted   Benzoyl peroxide Dermatitis 04/16/2023    Family History  Problem Relation Age of Onset   Urticaria Mother    Eczema Mother    Allergic rhinitis Mother    Diabetes Mother    Colon polyps Mother    Irritable bowel syndrome Mother    Hyperlipidemia Mother    Depression Father    Anxiety disorder Father    BRCA 1/2 Neg Hx    Breast cancer Neg Hx    Colitis Neg Hx    Esophageal cancer Neg Hx    Pancreatic cancer Neg Hx    Stomach cancer Neg Hx     Review of Systems:    Constitutional: No weight loss, fever, chills, weakness or fatigue HEENT: Eyes: No change in vision               Ears, Nose, Throat:  No change in hearing or congestion Skin: No rash or itching Cardiovascular: No chest pain, chest pressure or palpitations   Respiratory: No SOB or cough Gastrointestinal: See HPI and otherwise negative Genitourinary: No dysuria or change in urinary frequency Neurological: No headache, dizziness or syncope Musculoskeletal: No new muscle or joint pain Hematologic: No bleeding or bruising Psychiatric: No history of depression or anxiety    Physical Exam:  Vital signs: BP 110/60 (BP Location: Left Arm, Patient Position: Sitting, Cuff Size: Normal)   Pulse 76   Ht 5' 2.5" (1.588 m)   Wt 114 lb 6 oz (51.9 kg)    BMI 20.59 kg/m   Constitutional:   Pleasant  female appears to be in NAD, Well developed, Well nourished, alert and cooperative Throat: Oral cavity and pharynx without inflammation, swelling or lesion.  Respiratory: Respirations even and unlabored. Lungs clear to auscultation bilaterally.   No wheezes, crackles, or rhonchi.  Cardiovascular: Normal S1, S2. Regular rate and rhythm. No peripheral edema, cyanosis or pallor.  Gastrointestinal:  Soft, nondistended, nontender. No rebound or guarding. Normal bowel sounds. No appreciable masses or hepatomegaly. Rectal:  Not performed.  Msk:  Symmetrical without gross deformities. Without edema, no deformity or joint abnormality.  Neurologic:  Alert and  oriented x4;  grossly normal neurologically.  Skin:   Dry and intact without significant lesions or rashes. Psychiatric: Oriented to person, place and time. Demonstrates good judgement and reason without abnormal affect or behaviors.  RELEVANT LABS AND IMAGING: CBC    Latest Ref Rng & Units 11/28/2023    1:58 PM 10/25/2023    8:39 AM 10/22/2023    7:39 AM  CBC  WBC 3.8 - 10.8 Thousand/uL 9.8  9.8  8.5   Hemoglobin 11.7 - 15.5 g/dL 16.1  09.6  04.5   Hematocrit 35.0 - 45.0 % 45.8  49.5  47.2   Platelets 140 - 400 Thousand/uL 231  315  241      CMP     Latest Ref Rng & Units 12/16/2023    9:56 AM 11/28/2023    1:58 PM 11/08/2023    9:28 AM  CMP  Glucose 70 - 99 mg/dL 95  95  89   BUN 6 - 23 mg/dL 23  31  27    Creatinine 0.40 - 1.20 mg/dL 4.09  8.11  9.14   Sodium 135 - 145 mEq/L 134  132  132   Potassium 3.5 - 5.1 mEq/L 4.2  4.4  4.9   Chloride 96 - 112 mEq/L 99  98  98   CO2 19 - 32 mEq/L 25  25  23    Calcium 8.4 - 10.5 mg/dL 8.9  9.0  9.6   Total Protein 6.1 - 8.1 g/dL  6.9  7.0   Total Bilirubin 0.2 - 1.2 mg/dL  0.3  0.5   AST 10 - 30 U/L  14  14   ALT 6 - 29 U/L  23  25      Lab Results  Component Value Date   TSH 1.63 12/05/2023    08/05/2024 ANA 1: 320 nuclear speckled, ENA  (dsDNA, chromatin, Smith, RNP, SSA, SSB, SCL 70, Jo 1, centromere, anticardiolipin, beta-2 GP 1) negative, C3-C4 normal, RF IgM 28, anti-CCP negative, MCV negative, TPO negative   Assessment: Encounter Diagnoses  Name Primary?   Altered bowel habits Yes   Generalized abdominal pain    Bloating     Marie returns to office for bloating and constipation. We discussed fiber supplementation in addition to using full dose Miralax po daily. She enquired about taking otc mag oxide for constipation which I told her was ok. She also has concerns for Sibo due to recent abx use doxycycline for her hand and like to retest. She would also like to retest stool tests due to altered bowels, bloating, and positive ANA.   Plan: - Patient would like to recheck GI profile panel , stool elastase , and fecal calprotectin - Patient would like to recheck Sibo breath test  - OTC Miralax po daily -Can add OTC Magnesium oxide 400mg  1 tablet daily   Thank you for the courtesy of this consult. Please call me with any questions or concerns.   Marie Mcgough, FNP-C [facility] 01/01/2024, 11:41 AM  Cc: Everlina Hock, NP

## 2024-01-01 NOTE — Patient Instructions (Addendum)
 Over the counter Magnesium oxide 400 mg, can take up to 1 tablet three times a day. Continue OTC Miralax po daily  Your provider has requested that you go to the basement level for lab work before leaving today. Press "B" on the elevator. The lab is located at the first door on the left as you exit the elevator.   You have been given a testing kit to check for small intestine bacterial overgrowth (SIBO) which is completed by a company named [facility]. Make sure to return your test in the mail using the return mailing label given to you along with the kit. The test order, your demographic and insurance information have all already been sent to the company. [facility] will collect an upfront charge of $99.74 for commercial insurance plans and $209.74 if you are paying cash. Make sure to discuss with [facility] PRIOR to having the test to see if they have gotten information from your insurance company as to how much your testing will cost out of pocket, if any. Please contact [facility] at phone number [phone] to get instructions regarding how to perform the test as our office is unable to give specific testing instructions.   _______________________________________________________  If your blood pressure at your visit was 140/90 or greater, please contact your primary care physician to follow up on this.  _______________________________________________________  If you are age 78 or older, your body mass index should be between 23-30. Your Body mass index is 20.59 kg/m. If this is out of the aforementioned range listed, please consider follow up with your Primary Care Provider.  If you are age 3 or younger, your body mass index should be between 19-25. Your Body mass index is 20.59 kg/m. If this is out of the aformentioned range listed, please consider follow up with your Primary Care Provider.   ________________________________________________________  The [facility]  providers would like to encourage you to use MYCHART to communicate with providers for non-urgent requests or questions.  Due to long hold times on the telephone, sending your provider a message by [facility] may be a faster and more efficient way to get a response.  Please allow 48 business hours for a response.  Please remember that this is for non-urgent requests.  _______________________________________________________ Thank you for trusting me with your gastrointestinal care. Deanna May, RNP

## 2024-01-01 NOTE — Progress Notes (Signed)
 I agree with the assessment and plan as outlined by Ms. May.

## 2024-01-02 ENCOUNTER — Ambulatory Visit: Payer: Self-pay | Admitting: Family Medicine

## 2024-01-02 NOTE — Progress Notes (Signed)
Right ankle x-ray looks normal to radiology

## 2024-01-07 ENCOUNTER — Ambulatory Visit

## 2024-01-07 ENCOUNTER — Ambulatory Visit (INDEPENDENT_AMBULATORY_CARE_PROVIDER_SITE_OTHER)

## 2024-01-07 ENCOUNTER — Other Ambulatory Visit: Payer: Self-pay | Admitting: Family Medicine

## 2024-01-07 ENCOUNTER — Ambulatory Visit (INDEPENDENT_AMBULATORY_CARE_PROVIDER_SITE_OTHER): Admitting: Sports Medicine

## 2024-01-07 DIAGNOSIS — M25551 Pain in right hip: Secondary | ICD-10-CM

## 2024-01-07 DIAGNOSIS — G8929 Other chronic pain: Secondary | ICD-10-CM | POA: Diagnosis not present

## 2024-01-07 DIAGNOSIS — M25571 Pain in right ankle and joints of right foot: Secondary | ICD-10-CM | POA: Diagnosis not present

## 2024-01-07 DIAGNOSIS — F419 Anxiety disorder, unspecified: Secondary | ICD-10-CM

## 2024-01-07 MED ORDER — TRIAMCINOLONE ACETONIDE 40 MG/ML IJ SUSP
40.0000 mg | Freq: Once | INTRAMUSCULAR | Status: AC
  Administered 2024-01-07: 40 mg via INTRAMUSCULAR

## 2024-01-07 MED ORDER — GADOBUTROL 1 MMOL/ML IV SOLN
1.0000 mL | Freq: Once | INTRAVENOUS | Status: AC | PRN
  Administered 2024-01-07: 1 mL

## 2024-01-07 NOTE — Progress Notes (Signed)
 [facility] referral adjusted per request

## 2024-01-07 NOTE — Assessment & Plan Note (Signed)
Injection performed today for MR arthrography, further management per primary treating provider. 

## 2024-01-07 NOTE — Progress Notes (Signed)
    Procedures performed today:    Procedure: Real-time Ultrasound Guided gadolinium contrast injection of right hip joint Device: Samsung HS60  Verbal informed consent obtained.  Time-out conducted.  Noted no overlying erythema, induration, or other signs of local infection.  Skin prepped in a sterile fashion.  Local anesthesia: Topical Ethyl chloride.  With sterile technique and under real time ultrasound guidance: Noted small potential cam deformity femoral neck, I advanced a 22-gauge spinal needle to the femoral head/neck junction, contacted bone, I then injected 1 cc kenalog  40, 2 cc lidocaine, 2 cc bupivacaine, syringe switched and 0.1 cc gadolinium injected, syringe switched and 10 cc sterile saline used to fullest in the joint. Joint visualized and capsule seen distending confirming intra-articular placement of contrast material and medication. Completed without difficulty  Advised to call if fevers/chills, erythema, induration, drainage, or persistent bleeding.  Images permanently stored in PACS Impression: Technically successful ultrasound guided gadolinium contrast injection for MR arthrography.  Please see separate MR arthrogram report.  Independent interpretation of notes and tests performed by another provider:   None.  Brief History, Exam, Impression, and Recommendations:    Chronic right hip pain Injection performed today for MR arthrography, further management per primary treating provider.    ____________________________________________ Joselyn Nicely. Sandy Crumb, M.D., ABFM., CAQSM., AME. Primary Care and Sports Medicine [facility]  Adjunct Professor of [employer] Medicine  [employer]  Restaurant manager, fast food

## 2024-01-12 ENCOUNTER — Encounter: Payer: Self-pay | Admitting: Sports Medicine

## 2024-01-13 ENCOUNTER — Other Ambulatory Visit

## 2024-01-13 DIAGNOSIS — R194 Change in bowel habit: Secondary | ICD-10-CM

## 2024-01-13 DIAGNOSIS — R1084 Generalized abdominal pain: Secondary | ICD-10-CM

## 2024-01-13 DIAGNOSIS — R140 Abdominal distension (gaseous): Secondary | ICD-10-CM

## 2024-01-15 ENCOUNTER — Other Ambulatory Visit: Payer: Self-pay | Admitting: Gastroenterology

## 2024-01-15 LAB — GI PROFILE, STOOL, PCR

## 2024-01-15 LAB — CALPROTECTIN, FECAL: Calprotectin, Fecal: 22 ug/g (ref 0–120)

## 2024-01-16 ENCOUNTER — Ambulatory Visit: Payer: Self-pay | Admitting: Gastroenterology

## 2024-01-18 LAB — PANCREATIC ELASTASE, FECAL: Pancreatic Elastase-1, Stool: 478 ug/g (ref >200)

## 2024-01-20 ENCOUNTER — Encounter: Payer: Self-pay | Admitting: Obstetrics and Gynecology

## 2024-01-20 DIAGNOSIS — Z8262 Family history of osteoporosis: Secondary | ICD-10-CM

## 2024-01-20 DIAGNOSIS — R232 Flushing: Secondary | ICD-10-CM

## 2024-01-20 DIAGNOSIS — R7989 Other specified abnormal findings of blood chemistry: Secondary | ICD-10-CM

## 2024-01-22 ENCOUNTER — Encounter: Payer: Self-pay | Admitting: Dermatology

## 2024-01-22 ENCOUNTER — Ambulatory Visit (INDEPENDENT_AMBULATORY_CARE_PROVIDER_SITE_OTHER): Admitting: Dermatology

## 2024-01-22 ENCOUNTER — Ambulatory Visit: Payer: Self-pay | Admitting: Family Medicine

## 2024-01-22 VITALS — BP 117/78 | HR 80

## 2024-01-22 DIAGNOSIS — L700 Acne vulgaris: Secondary | ICD-10-CM | POA: Diagnosis not present

## 2024-01-22 DIAGNOSIS — M25551 Pain in right hip: Secondary | ICD-10-CM

## 2024-01-22 DIAGNOSIS — J309 Allergic rhinitis, unspecified: Secondary | ICD-10-CM | POA: Insufficient documentation

## 2024-01-22 DIAGNOSIS — Z79899 Other long term (current) drug therapy: Secondary | ICD-10-CM

## 2024-01-22 NOTE — Progress Notes (Signed)
 Right hip MRI she has a labrum tear.  No arthritis is visible.  I will refer you to orthopedic surgery to talk about surgical options for this hip.  You should hear from Dr. Verline Glow office shortly

## 2024-01-22 NOTE — Patient Instructions (Addendum)
 Hello Marie Hanson,  Today we discussed initiating Isotretinoin therapy for better management of your acne.  Here is an overview of what we discussed:   Initiating Accutane (isotretinoin) treatment:     - Starting at a low dose: 10-20 mg daily      - Duration: Estimated 8-10 months at lower doses     - Take with a fatty meal for better absorption     - Informed consent discussed: potential side effects explained     Register in [employer] program     - 30-day waiting period required     - Two forms of birth control mandatory     Laboratory tests:     - Baseline pregnancy test today     - Repeat pregnancy test in 30 days before prescription     - Monitor liver function tests and lipid profile 2 months into therapy   Continue current acne regimen until we start your isotretinoin in 4 weeks   Important Information   Due to recent changes in healthcare laws, you may see results of your pathology and/or laboratory studies on MyChart before the doctors have had a chance to review them. We understand that in some cases there may be results that are confusing or concerning to you. Please understand that not all results are received at the same time and often the doctors may need to interpret multiple results in order to provide you with the best plan of care or course of treatment. Therefore, we ask that you please give us  2 business days to thoroughly review all your results before contacting the office for clarification. Should we see a critical lab result, you will be contacted sooner.     If You Need Anything After Your Visit   If you have any questions or concerns for your doctor, please call our main line at [phone]. If no one answers, please leave a voicemail as directed and we will return your call as soon as possible. Messages left after 4 pm will be answered the following business day.    You may also send us  a message via MyChart. We typically respond to MyChart messages within 1-2  business days.  For prescription refills, please ask your pharmacy to contact our office. Our fax number is [phone].  If you have an urgent issue when the clinic is closed that cannot wait until the next business day, you can page your doctor at the number below.     Please note that while we do our best to be available for urgent issues outside of office hours, we are not available 24/7.    If you have an urgent issue and are unable to reach us , you may choose to seek medical care at your doctor's office, retail clinic, urgent care center, or emergency room.   If you have a medical emergency, please immediately call 911 or go to the emergency department. In the event of inclement weather, please call our main line at [phone] for an update on the status of any delays or closures.  Dermatology Medication Tips: Please keep the boxes that topical medications come in in order to help keep track of the instructions about where and how to use these. Pharmacies typically print the medication instructions only on the boxes and not directly on the medication tubes.   If your medication is too expensive, please contact our office at [phone] or send us  a message through MyChart.    We are unable to tell  what your co-pay for medications will be in advance as this is different depending on your insurance coverage. However, we may be able to find a substitute medication at lower cost or fill out paperwork to get insurance to cover a needed medication.    If a prior authorization is required to get your medication covered by your insurance company, please allow us  1-2 business days to complete this process.   Drug prices often vary depending on where the prescription is filled and some pharmacies may offer cheaper prices.   The website www.goodrx.com contains coupons for medications through different pharmacies. The prices here do not account for what the cost may be with help from insurance  (it may be cheaper with your insurance), but the website can give you the price if you did not use any insurance.  - You can print the associated coupon and take it with your prescription to the pharmacy.  - You may also stop by our office during regular business hours and pick up a GoodRx coupon card.  - If you need your prescription sent electronically to a different pharmacy, notify our office through [facility] or by phone at [phone]

## 2024-01-22 NOTE — Progress Notes (Signed)
 Follow-Up Visit   Subjective  Marie Hanson is a 43 y.o. female accompanied by husband ()who presents for the following: Acne  Patient present today for follow up visit for Acne. Patient was last evaluated on 11/04/23. At this visit patient was prescribed Alterno, recommended to continue Metro Gel and Azelaic Acid . Patient states she is applying Altreno  on her forehead 3 nights weekly and adapalene on the rest of her face 3 nights. She stopped the spironolactone  due to abdominal pains. Patient reports sxs are unchanged. Patient denies medication changes.  The following portions of the chart were reviewed this encounter and updated as appropriate: medications, allergies, medical history  Review of Systems:  No other skin or systemic complaints except as noted in HPI or Assessment and Plan.  Objective  Well appearing patient in no apparent distress; mood and affect are within normal limits.  A focused examination was performed of the following areas: Face  Relevant exam findings are noted in the Assessment and Plan.           Assessment & Plan   ACNE VULGARIS Exam: Open comedones and inflammatory papules  Flared   Assessment: Patient presents with persistent acne, previously treated with Metrogel, spironolactone , and altreno  and adapalene medications. Metrogel was discontinued due to skin irritation. Spironolactone  dose was reduced from 150mg  to 100mg  due to stomach pain, but it is not effectively controlling the acne. The patient does not have huge cystic acne, suggesting a milder form of persistent acne. Previous treatments have not provided satisfactory results. The patient is now open to discussing Accutane (isotretinoin) treatment, which was previously paused due to birth control considerations.  Treatment Plan:    Discontinue spironolactone     Initiate Accutane (isotretinoin) treatment:     - Starting dose: 10-20 mg daily (based on weight of 51 kg)     - Duration:  Estimated 8-10 months at lower doses     - Take with a fatty meal for better absorption     - Informed consent discussed: potential side effects explained    Register patient in [employer] program     - 30-day waiting period required     - Two forms of birth control mandatory    Laboratory tests:     - Baseline pregnancy test today     - Repeat pregnancy test in 30 days before prescription     - Monitor liver function tests and lipid profile 2 months into therapy    Continue Altrina on forehead and adapalene on cheeks    Discontinue active topicals (benzoyl peroxide, salicylic acid)    Allow spot treatment with azelaic acid  if needed    Implement supportive measures:     - Use recommended moisturizers and lip care products     - Consider using a humidifier     - Apply daily sunscreen     - Stay hydrated and use eye drops as needed    Patient education:     - Expect potential initial worsening (purge) of acne     - Emphasize importance of not getting pregnant while on Accutane     - Discuss potential need for 6-10 months of treatment     - Explain 94-96% long-term clearance rate after treatment course  HIGH RISK MEDICATION USE    Return in about 4 weeks (around 02/19/2024) for Accutane F/U.  I, Jetta Ager, am acting as Neurosurgeon for [employer], DO.  Documentation: I have reviewed the above documentation for accuracy  and completeness, and I agree with the above.  Louana Roup, DO

## 2024-01-22 NOTE — Progress Notes (Signed)
 Right ankle MRI is normal appearing.  Recommend returning to clinic to talk about the ankle findings and treatment plan and options.  We can also talk about the hip as well.

## 2024-01-31 ENCOUNTER — Encounter: Payer: Self-pay | Admitting: Family Medicine

## 2024-02-05 ENCOUNTER — Ambulatory Visit (HOSPITAL_BASED_OUTPATIENT_CLINIC_OR_DEPARTMENT_OTHER): Admitting: Orthopaedic Surgery

## 2024-02-05 DIAGNOSIS — M25551 Pain in right hip: Secondary | ICD-10-CM

## 2024-02-05 NOTE — Progress Notes (Signed)
 Chief Complaint: Hip pain     History of Present Illness:    Marie Hanson is a 43 y.o. female presents today with ongoing right hip pain particular about the abductor tendon as well as the posterior ischial tuberosity.  This has been ongoing for quite some time and is worsening recently.  She did have an injection in the femoral acetabular joint which gave her a few weeks of relief.  At this point she is continuing to have pain and numbness that does go down the medial thigh consistent more with the obturator nerve area.  She did have a an MRI with Dr. Joane.  She is hoping to admit a possible    PMH/PSH/Family History/Social History/Meds/Allergies:    Past Medical History:  Diagnosis Date   Anal fissure    Anxiety    Cardiac arrhythmia    during chidlhood   Depression    Eczema    Nipple pain x's 2 months   Rosacea    Urticaria    Past Surgical History:  Procedure Laterality Date   AUGMENTATION MAMMAPLASTY Bilateral    breast enhancement & removal   BREAST IMPLANT REMOVAL Bilateral    CYST REMOVAL HAND Left 2025   ganglion cyst   REFRACTIVE SURGERY Bilateral    Social History   Socioeconomic History   Marital status: Married    Spouse name: Not on file   Number of children: 0   Years of education: Not on file   Highest education level: Bachelor's degree (e.g., BA, AB, BS)  Occupational History   Not on file  Tobacco Use   Smoking status: Never    Passive exposure: Never   Smokeless tobacco: Never  Vaping Use   Vaping status: Never Used  Substance and Sexual Activity   Alcohol use: Not Currently   Drug use: Never   Sexual activity: Yes    Partners: Male    Birth control/protection: OCP  Other Topics Concern   Not on file  Social History Narrative   Not on file   Social Drivers of Health   Financial Resource Strain: Low Risk  (11/27/2023)   Overall Financial Resource Strain (CARDIA)    Difficulty of Paying Living Expenses: Not hard at all   Food Insecurity: No Food Insecurity (11/27/2023)   Hunger Vital Sign    Worried About Running Out of Food in the Last Year: Never true    Ran Out of Food in the Last Year: Never true  Transportation Needs: No Transportation Needs (11/27/2023)   PRAPARE - Administrator, Civil Service (Medical): No    Lack of Transportation (Non-Medical): No  Physical Activity: Insufficiently Active (11/27/2023)   Exercise Vital Sign    Days of Exercise per Week: 4 days    Minutes of Exercise per Session: 30 min  Stress: Stress Concern Present (11/27/2023)   [employer] of Occupational Health - Occupational Stress Questionnaire    Feeling of Stress : Rather much  Social Connections: Moderately Isolated (11/27/2023)   Social Connection and Isolation Panel    Frequency of Communication with Friends and Family: Three times a week    Frequency of Social Gatherings with Friends and Family: Once a week    Attends Religious Services: Never    Database administrator or Organizations: No    Attends Engineer, structural: Not on file    Marital Status: Married   Family History  Problem Relation Age of Onset  Urticaria Mother    Eczema Mother    Allergic rhinitis Mother    Diabetes Mother    Colon polyps Mother    Irritable bowel syndrome Mother    Hyperlipidemia Mother    Osteoporosis Mother    Depression Father    Anxiety disorder Father    BRCA 1/2 Neg Hx    Breast cancer Neg Hx    Colitis Neg Hx    Esophageal cancer Neg Hx    Pancreatic cancer Neg Hx    Stomach cancer Neg Hx    Allergies  Allergen Reactions   Benzoyl Peroxide Dermatitis   Current Outpatient Medications  Medication Sig Dispense Refill   Adapalene 0.3 % gel Apply 1 Application topically at bedtime. (Patient not taking: Reported on 01/22/2024)     Azelaic Acid  15 % gel AFTER SKIN IS THOROUGHLY WASHED AND PATTED DRY, GENTLY AND THOROUGHLY APPLY A THIN FILM TO AFFECTED AREA IN THE MORNING AND AT NIGHT  (Patient not taking: Reported on 01/22/2024) 50 g 2   EPINEPHrine  0.3 mg/0.3 mL IJ SOAJ injection      hyoscyamine  (LEVSIN ) 0.125 MG tablet TAKE 1 TABLET BY MOUTH AS NEEDED FOR CRAMPING 30 tablet 0   ibuprofen (ADVIL) 200 MG tablet Take 200 mg by mouth every 6 (six) hours as needed for mild pain (pain score 1-3).     metroNIDAZOLE (METROGEL) 0.75 % gel Apply 1 Application topically daily. (Patient not taking: Reported on 01/22/2024)     MIEBO 1.338 GM/ML SOLN Apply to eye.     montelukast  (SINGULAIR ) 10 MG tablet TAKE 1 TABLET BY MOUTH EVERYDAY AT BEDTIME 90 tablet 1   Multiple Vitamin (MULTIVITAMIN) tablet Take 1 tablet by mouth daily.     nystatin  cream (MYCOSTATIN ) Apply 1 Application topically 3 (three) times daily.     RESTASIS 0.05 % ophthalmic emulsion      spironolactone  (ALDACTONE ) 50 MG tablet TAKE 3 TABLETS BY MOUTH EVERY DAY (Patient not taking: Reported on 01/22/2024) 270 tablet 4   Tretinoin  (ALTRENO ) 0.05 % LOTN Use pea size amount on face 1 time a week at night. (Patient not taking: Reported on 01/22/2024) 45 g 0   YASMIN  28 3-0.03 MG tablet Take 1 tablet by mouth daily. 84 tablet 4   Current Facility-Administered Medications  Medication Dose Route Frequency Provider Last Rate Last Admin   omalizumab  (XOLAIR ) prefilled syringe 300 mg  300 mg Subcutaneous Q28 days Marinda Rocky SAILOR, MD   300 mg at 05/14/23 1025   No results found.  Review of Systems:   A ROS was performed including pertinent positives and negatives as documented in the HPI.  Physical Exam :   Constitutional: NAD and appears stated age Neurological: Alert and oriented Psych: Appropriate affect and cooperative There were no vitals taken for this visit.   Comprehensive Musculoskeletal Exam:    Tenderness about the abductor tendon about the right thigh as it inserts onto the pubis.  There is some mild pain with resisted abduction.  Also pain about the posterior ischial tuberosity.  There is numbness about the  medial thigh past the knee.  FADIR is essentially negative with full range of motion about the hip without pain   Imaging:   Xray (3 views right hip): Normal  MRI (right hip): Anterior superior labral tear   I personally reviewed and interpreted the radiographs.   Assessment and Plan:   43 y.o. female with evidence of right hip abductor tendinitis at the pubis as well as  posterior proximal hamstring tendinitis.  She is experiencing some numbness in the obturator nerve distribution consistent with a possible impingement.  I did discuss that it is possible that hip instability and labral tearing would account for this although at this time she is hoping to avoid injection as she did have some facial flushing after previous injection.  I did discuss that it would be possible to perform a local only injection for diagnostic purposes although that being said she would like to start with conservative management and as result I will plan to send her to Dr. Burnetta for shockwave therapy about the adductor insertion on the pubis as well as posterior ischial tuberosity and hamstring as well as possible discussion of obturator nerve neurolysis   I personally saw and evaluated the patient, and participated in the management and treatment plan.  Elspeth Parker, MD Attending Physician, Orthopedic Surgery  This document was dictated using Dragon voice recognition software. A reasonable attempt at proof reading has been made to minimize errors.

## 2024-02-07 ENCOUNTER — Ambulatory Visit (INDEPENDENT_AMBULATORY_CARE_PROVIDER_SITE_OTHER): Admitting: Family Medicine

## 2024-02-07 VITALS — BP 102/70 | Ht 62.5 in | Wt 112.0 lb

## 2024-02-07 DIAGNOSIS — M25571 Pain in right ankle and joints of right foot: Secondary | ICD-10-CM

## 2024-02-07 DIAGNOSIS — M25551 Pain in right hip: Secondary | ICD-10-CM

## 2024-02-07 DIAGNOSIS — G8929 Other chronic pain: Secondary | ICD-10-CM | POA: Diagnosis not present

## 2024-02-07 MED ORDER — MELOXICAM 15 MG PO TABS: ORAL_TABLET | ORAL | 3 refills | Status: DC

## 2024-02-07 NOTE — Progress Notes (Signed)
 LILLETTE Ileana Collet, PhD, LAT, ATC acting as a scribe for Artist Lloyd, MD.  Marie Hanson is a 43 y.o. female who presents to [facility] at [facility] today for f/u R ankle and R hip pain w/ MRI review. Pt was last seen by Dr. Lloyd on 12/30/23 and a MRI arthrogram of her hip and MRI of her ankle were ordered.  Pt was seen by Dr. Genelle on 6/25 and referred her to Dr. Burnetta for ECSWT.  Today, pt reports slight improvement in R hip pain. She notes she got froze bite from the cold spray. No change in R ankle pain.   Dx imaging: 01/07/24 R ankle MRI & R hip MRI-a  12/30/23 R ankle XR 11/27/22 L-spine MRI 06/18/22 L-spine & pelvis XR  Pertinent review of systems: No fevers or chills  Relevant historical information: Flushing.  B12 deficiency.   Exam:  BP 102/70   Ht 5' 2.5 (1.588 m)   Wt 112 lb (50.8 kg)   BMI 20.16 kg/m  General: Well Developed, well nourished, and in no acute distress.   MSK: Right hip normal-appearing Mildly tender palpation at hip adductor origin and pelvis and mildly tender to palpation around the ischial tuberosity.  Right ankle is normal-appearing   Lab and Radiology Results  EXAM: MRI OF THE RIGHT ANKLE WITHOUT CONTRAST   TECHNIQUE: Multiplanar, multisequence MR imaging of the ankle was performed. No intravenous contrast was administered.   COMPARISON:  None Available.   FINDINGS: TENDONS   Peroneal: Intact.   Posteromedial: Intact.   Anterior: Intact.   Achilles: Normal.   Plantar Fascia: Normal.   LIGAMENTS   Lateral: Intact.   Medial: Intact.   CARTILAGE   Ankle Joint: Normal. No joint effusion or osteochondral lesion of the talar dome.   Subtalar Joints/Sinus Tarsi: Normal.   Bones: Normal.   Other: None.   IMPRESSION: Normal MRI right ankle.     Electronically Signed   By: Debby Prader M.D.   On: 01/22/2024 11:20   EXAM: MRI OF THE RIGHT HIP WITH CONTRAST (MR Arthrogram)    TECHNIQUE: Multiplanar, multisequence MR imaging of the hip was performed immediately following contrast injection into the hip joint under fluoroscopic guidance. No intravenous contrast was administered.   COMPARISON:  None Available.   FINDINGS: Bones: No fracture, stress change or focal lesion is identified. There is no subchondral cyst formation or edema about the hips. No avascular necrosis of the femoral heads.   Articular cartilage and labrum   Articular cartilage:  Preserved.   Labrum: There is a tear of the anterior aspect of the superior labrum.   Joint or bursal effusion   Joint effusion: The right hip is distended with contrast. No left hip effusion.   Bursae: Negative.   Muscles and tendons   Muscles and tendons:  Intact and normal in appearance.   Other findings   Miscellaneous:   Negative.   IMPRESSION: Tear of the anterior aspect of the superior right labrum. The exam is otherwise negative.     Electronically Signed   By: Debby Prader M.D.   On: 01/22/2024 11:18 I, Artist Lloyd, personally (independently) visualized and performed the interpretation of the images attached in this note.                 Extracorporeal Shockwave Therapy Note    Patient is being treated today with ECSWT. Informed consent was obtained and patient tolerated procedure well.   Therapy  performed by Artist Lloyd  Condition treated: Right hip pain Treatment preset used: Tendinitis Default modification Energy used: 120 mJ Frequency used: 15 Hz Number of pulses: 4000 total.  2000 at hip adductor origin and 2000 around ischial tuberosity Head Size: Medium Treatment #1  No charge for shockwave today.   Assessment and Plan: 43 y.o. female with right hip pain multifactorial.  Patient does have a labrum tear on her MRI.  She had a consultation with Dr. Genelle on June 25.  He would like to proceed to a trial of shockwave.  He is concerned that her pain is more consistent  with an obturator nerve issue.  His goal for shockwave is abductor insertion on the pubis as well as posterior ischial tuberosity and hamstring and consider obturator nerve neurolysis.  Her first appointment with Dr. Burnetta is on July 10.  Plan to get shockwave started today for both the hip adductor's as well as the ischial tuberosity.  We can get 2 shockwave sessions 1 today and 1 next week before her first appointment with Dr. Burnetta.  I do think it makes sense for her to see Dr. Burnetta and continue treatment with him in the month of July and August as I will be out on medical leave from August 1 till September 15 and it will be challenging to get an appointment with my partners at that time.  For now refill of meloxicam  to use as needed.  Additionally she notes continued pain and some paresthesia into the right ankle.  Her MRI in this area was totally normal.  We have discussed proceeding to a nerve conduction study for this issue which we will go ahead and order now.   PDMP not reviewed this encounter. Orders Placed This Encounter  Procedures   Ambulatory referral to Neurology    Referral Priority:   Routine    Referral Type:   Consultation    Referral Reason:   Specialty Services Required    Requested Specialty:   Neurology    Number of Visits Requested:   1   NCV with EMG(electromyography)    Standing Status:   Future    Expiration Date:   02/06/2025    Where should this test be performed?:   [facility]    Location on body:   Lower extremity    Laterality:   Right    Clinical Indication:   Right medial ankle paresthesia and pain MRI normal.    Release to patient:   Immediate   Meds ordered this encounter  Medications   meloxicam  (MOBIC ) 15 MG tablet    Sig: One tab PO qAM with breakfast for 2 weeks, then daily prn pain.    Dispense:  30 tablet    Refill:  3     Discussed warning signs or symptoms. Please see discharge instructions. Patient expresses understanding.   The above  documentation has been reviewed and is accurate and complete Artist Lloyd, M.D.

## 2024-02-07 NOTE — Patient Instructions (Addendum)
 Thank you for coming in today.   Check back next week for another shockwave treatment  You should hear from neurology soon about scheduling the nerve conduction study.

## 2024-02-10 ENCOUNTER — Encounter: Payer: Self-pay | Admitting: Neurology

## 2024-02-10 ENCOUNTER — Other Ambulatory Visit: Payer: Self-pay

## 2024-02-10 DIAGNOSIS — R202 Paresthesia of skin: Secondary | ICD-10-CM

## 2024-02-12 ENCOUNTER — Ambulatory Visit (INDEPENDENT_AMBULATORY_CARE_PROVIDER_SITE_OTHER): Payer: Self-pay | Admitting: Family Medicine

## 2024-02-12 DIAGNOSIS — M25551 Pain in right hip: Secondary | ICD-10-CM

## 2024-02-12 NOTE — Progress Notes (Signed)
   [facility] [address] [place] [address] Phone: [phone]   Extracorporeal Shockwave Therapy Note    Patient is being treated today with ECSWT. Informed consent was obtained and patient tolerated procedure well.   Therapy performed by Marie Joane  Condition treated: Right hip tendinitis Treatment preset used: Tendinitis Energy used: 120 mJ Frequency used: 15 Hz Number of pulses: 4000 total.  1000 at anterior hip adductor's, 1000 at anterior hip flexor and 2000 at ischial tuberosity hamstring tendon insertion Head Size: Medium Treatment #2 of #4-6  Electronically signed by:  [facility] 10:39 AM 02/12/24  Patient's next appointment will be with Dr. Burnetta to continue shockwave treatment.  Hopefully we found some areas that may be amenable to this modality.

## 2024-02-12 NOTE — Patient Instructions (Signed)
 Please let me know if you get worse

## 2024-02-19 ENCOUNTER — Encounter: Payer: Self-pay | Admitting: Dermatology

## 2024-02-19 ENCOUNTER — Ambulatory Visit (INDEPENDENT_AMBULATORY_CARE_PROVIDER_SITE_OTHER): Admitting: Dermatology

## 2024-02-19 VITALS — BP 107/73 | Wt 112.0 lb

## 2024-02-19 DIAGNOSIS — L853 Xerosis cutis: Secondary | ICD-10-CM

## 2024-02-19 DIAGNOSIS — T50995A Adverse effect of other drugs, medicaments and biological substances, initial encounter: Secondary | ICD-10-CM | POA: Diagnosis not present

## 2024-02-19 DIAGNOSIS — L700 Acne vulgaris: Secondary | ICD-10-CM | POA: Diagnosis not present

## 2024-02-19 DIAGNOSIS — Z79899 Other long term (current) drug therapy: Secondary | ICD-10-CM

## 2024-02-19 DIAGNOSIS — K130 Diseases of lips: Secondary | ICD-10-CM | POA: Diagnosis not present

## 2024-02-19 DIAGNOSIS — Z7189 Other specified counseling: Secondary | ICD-10-CM

## 2024-02-19 NOTE — Progress Notes (Signed)
 Isotretinoin  Follow-Up Visit   Subjective  Marie Hanson is a 43 y.o. female who presents for the following: Isotretinoin  follow-up  Week # 0   Isotretinoin  F/U - 02/19/24 1300       Isotretinoin  Follow Up   iPledge # [id number]    Date 02/19/24    Weight 112 lb (50.8 kg)      Dosage   Target Dosage (mg) 7500      Labs Notes   Last labs done 02/19/24    Lab comment negative         Side effects: Dry skin, dry lips  Patient is not pregnant, not seeking pregnancy, and not breastfeeding.   The following portions of the chart were reviewed this encounter and updated as appropriate: medications, allergies, medical history  Review of Systems:  No other skin or systemic complaints except as noted in HPI or Assessment and Plan.  Objective  Well appearing patient in no apparent distress; mood and affect are within normal limits.  An examination of the face, neck, chest, and back was performed and relevant findings are noted below.           Assessment & Plan     ACNE VULGARIS Patient is currently on Isotretinoin  requiring FDA mandated monthly evaluations and laboratory monitoring. Condition is currently not to goal (must reach target dose based on weight and also have clear skin for 2 months prior to discontinuation in order to help prevent relapse)   Week # 0 Pharmacy - [facility] - will send 10mg  script iPLEDGE # [id number] Total mg -  7500 Total mg/kg - 0 Birth Control- Female Vasectomy & Oral BC   Assessment: Patient is scheduled to start isotretinoin  therapy for acne management. Currently using topical treatments, to be discontinued by Friday. Patient has been on spironolactone  75 mg daily for acne treatment, with a previous attempt to reduce the dose causing a hormonal flare. No contraindications for concurrent use of isotretinoin  and spironolactone  identified through interaction checking.  - Plan:    Start isotretinoin  10 mg daily (prescription to  be sent on Monday, February 24, 2024)    Stop all topical acne treatments by Friday, February 21, 2024    Taper spironolactone :     - Reduce to 50 mg daily starting immediately     - Continue 50 mg daily for one month, then discontinue    Patient to complete iPledge program requirements on Monday, February 24, 2024:     - Verify counseling and birth control     - Confirm information via email from [employer] ([facility]) will process prescription and mail medication (expected delivery in 24-48 hours)    Anticipate treatment initiation on Wednesday, February 26, 2024    Monthly follow-up appointments required, including pregnancy tests    Next appointment scheduled for 4 weeks from Monday, February 24, 2024    Patient educated on importance of adhering to appointment schedule to maintain medication access    Patient instructed to message via MyChart for any concerns, including potential purging during upcoming event in mid-August  Start isotretinoin    Urine pregnancy test performed in office today and was negative.  Patient demonstrates comprehension and confirms she will not get pregnant.   Patient confirmed in iPledge and isotretinoin  sent to pharmacy.   Isotretinoin  Counseling; Review and Contraception Counseling: Reviewed potential side effects of isotretinoin  including xerosis, cheilitis, hepatitis, hyperlipidemia, and severe birth defects if taken by a pregnant woman.  Women on  isotretinoin  must be celibate (not having sex) or required to use at least 2 birth control methods to prevent pregnancy (unless patient is a female of non-child bearing potential).  Females of child-bearing potential must have monthly pregnancy tests while on isotretinoin  and report through I-Pledge (FDA monitoring program). Reviewed reports of suicidal ideation in those with a history of depression while taking isotretinoin  and reports of diagnosis of inflammatory bowl disease (IBD) while taking isotretinoin  as well as the  lack of evidence for a causal relationship between isotretinoin , depression and IBD. Patient advised to reach out with any questions or concerns. Patient advised not to share pills or donate blood while on treatment or for one month after completing treatment. All patient's considering Isotretinoin  must read and understand and sign Isotretinoin  Consent Form and be registered with I-Pledge.  Xerosis secondary to isotretinoin  therapy   Cheilitis secondary to isotretinoin  therapy   Long term medication management (isotretinoin )  Patient is using long term (months to years) prescription medication  to control their dermatologic condition.  These medications require periodic monitoring to evaluate for efficacy and side effects and may require periodic laboratory monitoring.  - While taking Isotretinoin  and for 30 days after you finish the medication, do not get pregnant, do not share pills, do not donate blood. Isotretinoin  is best absorbed when taken with a fatty meal. Isotretinoin  can make you sensitive to the sun. Daily careful sun protection including sunscreen SPF 30+ when outdoors is recommended.  Follow-up in 30 days.  Documentation: I have reviewed the above documentation for accuracy and completeness, and I agree with the above.  I, Shirron Maranda, CMA, am acting as scribe for [employer], DO.   Delon Lenis, DO

## 2024-02-19 NOTE — Patient Instructions (Addendum)

## 2024-02-20 ENCOUNTER — Ambulatory Visit: Admitting: Sports Medicine

## 2024-02-20 ENCOUNTER — Encounter: Payer: Self-pay | Admitting: Sports Medicine

## 2024-02-20 DIAGNOSIS — S73191D Other sprain of right hip, subsequent encounter: Secondary | ICD-10-CM | POA: Diagnosis not present

## 2024-02-20 DIAGNOSIS — G8929 Other chronic pain: Secondary | ICD-10-CM

## 2024-02-20 DIAGNOSIS — M25551 Pain in right hip: Secondary | ICD-10-CM

## 2024-02-20 DIAGNOSIS — M5410 Radiculopathy, site unspecified: Secondary | ICD-10-CM | POA: Diagnosis not present

## 2024-02-20 DIAGNOSIS — Q796 Ehlers-Danlos syndrome, unspecified: Secondary | ICD-10-CM | POA: Diagnosis not present

## 2024-02-20 MED ORDER — AMITRIPTYLINE HCL 25 MG PO TABS: 25.0000 mg | ORAL_TABLET | Freq: Every day | ORAL | 1 refills | Status: DC

## 2024-02-20 NOTE — Progress Notes (Signed)
 Patient says that she has had 2 sessions of shockwave therapy with Dr. Joane in the last two weeks and has not gotten any relief from those. She's says that her pain and numbness feel as though they have gotten a bit worse. She points over her adductors, hip flexors, and proximal hamstrings when describing her symptoms. She does have a nerve conduction study scheduled, as well. She does not want an injection as she only got temporary relief from the injection that she got with her MRI, and says that she has a reaction to cortisone.

## 2024-02-20 NOTE — Progress Notes (Signed)
 Marie Hanson - 43 y.o. female MRN [id number]  Date of birth: July 14, 1981  Office Visit Note: Visit Date: 02/20/2024 PCP: Almarie Waddell NOVAK, NP Referred by: Genelle Standing, MD  Subjective: Chief Complaint  Patient presents with   Right Hip - Pain   HPI: Marie Hanson is a pleasant 43 y.o. female who presents today for follow-up and evaluation of chronic right hip pain with RLE radicular symptoms.  [name] has had pain and tightness over the anterior hip for the last few years.  More recently within the past 6 to 12 months she has had numbness and tingling sensation that goes down the anterior medial aspect of the leg into the foot, she also has this tingling sensation from the posterior buttock down the posterior aspect of the leg as well.  She has had an MRI of the hip which did show a tear of the anterior aspect of the superior labrum.  Also had an MRI of the right ankle which was noncontributory.  She has been evaluated by multiple providers.  Did have a lumbar injection performed by Dr. Eldonna which did not give her any relief of her symptoms.  She did have a corticosteroid injection into the intra-articular hip at the time of her MRI arthrogram which certainly helped her symptoms of the hip specifically for about 1 week and then her pain started to return.  Was unsure exactly how much this relieved her tingling sensation down the leg but does state overall it helped in general.  He did have a flushing and histamine  like reaction following this injection however.  She was seen and evaluated by Dr. Genelle, he recommended trial of shockwave therapy first before considering surgical fixation of the labral tear.  Also thought she may have a degree of obturator nerve neuritis.  She did see Dr. Joane on 6/27 and 02/12/2024 for 2 treatments of extracorporeal shockwave therapy for the anterior hip adductors, hip flexors and ischial tuberosity/hamstring tendon.  Unfortunately, she states she  did not receive any relief from either of these treatments.  She was referred to neurology for nerve conduction study/EMG, she has this appointment upcoming on 03/20/2024.  In the past, she has taken meloxicam  15 mg which did provide some relief and help with inflammation in the hip/leg.  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  She will have episodes of flushing/itchiness, skin manifestations.  She denies any stretchy skin.  She does note her increased flexibility of her joints.  She used to have episodes of knee hyperextension.  She has been evaluated as well by rheumatology, had some inconclusive markers for rheumatologic conditions, but this came back inconclusive.  Pertinent ROS were reviewed with the patient and found to be negative unless otherwise specified above in HPI.   Assessment & Plan: Visit Diagnoses:  1. Ehlers-Danlos syndrome   2. Chronic right hip pain   3. Tear of right acetabular labrum, subsequent encounter   4. Radicular pain of right lower extremity    Plan: Impression is chronic and persistent right hip pain with radiating pain down the right leg both on the anterior and posterior side, sometimes into the foot.  She does have a labral tear of the right hip on her MRI arthrogram that is notable.  However, she has significant joint hyperlaxity, specifically in the hips which I also believe is contributing.  She is having some symptoms of hip instability, although given her joint  hyperlaxity, I do not know to what degree a repair of the labrum would provide for her.  It certainly may help her pain temporarily within the hip however. We discussed referring her to [facility], for specialized soft tissue and dry needling as this could be coming from muscle and myofascial restriction that is a contributing component.  She is open and agreeable to trying this.  She is having numbness and tingling down the right leg  both anteriorly and posteriorly, and I am not exactly sure the true etiology of this, could possibly be coming simply from reciprocal/radiating pain from the hip joint but lower suspicion.  I do not believe this is coming from the back as she has had fluoroscopy based injections which did not give her any relief.  She does have an upcoming nerve conduction study with neurology in the beginning of August which I think would be helpful to evaluate and potentially diagnose an associated condition.  I did discuss with her that I do believe she has Ehlers-Danlos syndrome given her multiple joint laxity/hypermobility as well as her histamine  intolerance, mast cell hypersensitivity and skin manifestations.  She however does not have skin hyperelasticity.  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  I encouraged her to do some research on her own about this. Could always consider genetic testing, but do not think this would change management.  For her associated symptoms however, I would like to trial her on a course of Elavil  25 mg nightly for the next 1 month to see what sort of symptom relief she receives.  In terms of her hip pain, she has found benefit from meloxicam  in the past, she may use 15 mg as needed.  Once she has her nerve conduction studies, she will follow-up with referring provider, Dr. Genelle.  I am happy to see her back as needed.  Given the fact that she had no relief at all from 2 prior extracorporeal shockwave treatments, we decided not to pursue this further today.  Follow-up: Return for F/u with Dr. Genelle after NCS/EMG, am happy to see her following this as needed.   Meds & Orders:  Meds ordered this encounter  Medications   amitriptyline  (ELAVIL ) 25 MG tablet    Sig: Take 1 tablet (25 mg total) by mouth at bedtime.    Dispense:  30 tablet    Refill:  1   No orders of the defined types were placed in this encounter.    Procedures: No procedures  performed      Clinical History: CLINICAL DATA:  Low back pain for over 1 year. Pain radiates to bilateral legs.   EXAM: MRI LUMBAR SPINE WITHOUT CONTRAST   TECHNIQUE: Multiplanar, multisequence MR imaging of the lumbar spine was performed. No intravenous contrast was administered.   COMPARISON:  None Available.   FINDINGS: Segmentation:  Standard.   Alignment:  Physiologic.   Vertebrae: No acute fracture, evidence of discitis, or aggressive bone lesion.   Conus medullaris and cauda equina: Conus extends to the T12-L1 level. Conus and cauda equina appear normal.   Paraspinal and other soft tissues: No acute paraspinal abnormality.   Disc levels:   Disc spaces: Disc desiccation L4-5. Disc heights are relatively well maintained.   T12-L1: Mild broad-based disc bulge. No foraminal or central canal stenosis.   L1-L2: Minimal broad-based disc bulge. No foraminal or central canal stenosis.   L2-L3: No significant disc bulge. No neural foraminal stenosis.  No central canal stenosis.   L3-L4: No significant disc bulge. No neural foraminal stenosis. No central canal stenosis.   L4-L5: Broad-based disc bulge with a small central annular fissure. No foraminal or central canal stenosis.   L5-S1: No significant disc bulge. No neural foraminal stenosis. No central canal stenosis.   IMPRESSION: 1. At L4-5 there is a broad-based disc bulge with a small central annular fissure. No evidence of nerve root impingement. 2. No acute osseous injury of the lumbar spine.     Electronically Signed   By: Julaine Blanch M.D.   On: 11/30/2022 11:10  She reports that she has never smoked. She has never been exposed to tobacco smoke. She has never used smokeless tobacco. No results for input(s): HGBA1C, LABURIC in the last 8760 hours.  Objective:    Physical Exam  Gen: Well-appearing, in no acute distress; non-toxic CV: Well-perfused. Warm.  Resp: Breathing unlabored on room  air; no wheezing. Psych: Fluid speech in conversation; appropriate affect; normal thought process  Ortho Exam - Hips: There is no bony tenderness or greater trochanteric TTP.  There is excessive range of motion with internal and external range of motion with logroll.  There is significant joint laxity noted with external rotation of about 90 degrees of bilateral hips, internal rotation about 50 degrees.  There is no significant pain with FABER or FADIR testing.  - Lumbar: No midline spinous process TTP.  There is full range of motion with flexion and extension.  Negative SLR.  - Beighton Score: 7/9 (elbows, thumbs, pinky finger, standing reach; negative knees)  Imaging:  MR ANKLE RIGHT WO CONTRAST CLINICAL DATA:  Right ankle pain for 1 year.  No known injury.  EXAM: MRI OF THE RIGHT ANKLE WITHOUT CONTRAST  TECHNIQUE: Multiplanar, multisequence MR imaging of the ankle was performed. No intravenous contrast was administered.  COMPARISON:  None Available.  FINDINGS: TENDONS  Peroneal: Intact.  Posteromedial: Intact.  Anterior: Intact.  Achilles: Normal.  Plantar Fascia: Normal.  LIGAMENTS  Lateral: Intact.  Medial: Intact.  CARTILAGE  Ankle Joint: Normal. No joint effusion or osteochondral lesion of the talar dome.  Subtalar Joints/Sinus Tarsi: Normal.  Bones: Normal.  Other: None.  IMPRESSION: Normal MRI right ankle.  Electronically Signed   By: Debby Prader M.D.   On: 01/22/2024 11:20 MR HIP RIGHT W CONTRAST CLINICAL DATA:  Chronic posterior right hip pain, popping and tightness. Symptoms for 2 years. No known injury.  EXAM: MRI OF THE RIGHT HIP WITH CONTRAST (MR Arthrogram)  TECHNIQUE: Multiplanar, multisequence MR imaging of the hip was performed immediately following contrast injection into the hip joint under fluoroscopic guidance. No intravenous contrast was administered.  COMPARISON:  None Available.  FINDINGS: Bones: No fracture,  stress change or focal lesion is identified. There is no subchondral cyst formation or edema about the hips. No avascular necrosis of the femoral heads.  Articular cartilage and labrum  Articular cartilage:  Preserved.  Labrum: There is a tear of the anterior aspect of the superior labrum.  Joint or bursal effusion  Joint effusion: The right hip is distended with contrast. No left hip effusion.  Bursae: Negative.  Muscles and tendons  Muscles and tendons:  Intact and normal in appearance.  Other findings  Miscellaneous:   Negative.  IMPRESSION: Tear of the anterior aspect of the superior right labrum. The exam is otherwise negative.  Electronically Signed   By: Debby Prader M.D.   On: 01/22/2024 11:18    Past Medical/Family/Surgical/Social  History: Medications & Allergies reviewed per EMR, new medications updated. Patient Active Problem List   Diagnosis Date Noted   Allergic rhinitis, unspecified 01/22/2024   Chronic right hip pain 01/07/2024   Anogenital HSV infection 08/13/2023   Hot flashes 07/26/2023   Vitamin B12 deficiency 06/20/2023   Flushing 06/03/2023   Chronic urticaria 03/18/2023   Acne vulgaris 03/18/2023   Seasonal and perennial allergic rhinitis 10/24/2022   Allergic conjunctivitis of both eyes 10/24/2022   Nickel allergy  10/24/2022   Raynaud's phenomenon 06/18/2022   Rosacea 03/05/2019   Anxiety 03/05/2019   Past Medical History:  Diagnosis Date   Allergy     Hay fever, pollen, mold etc.   Anal fissure    Anxiety    Cardiac arrhythmia    during chidlhood   Depression    Eczema    Nipple pain x's 2 months   Rosacea    Urticaria    Family History  Problem Relation Age of Onset   Urticaria Mother    Eczema Mother    Allergic rhinitis Mother    Diabetes Mother    Colon polyps Mother    Irritable bowel syndrome Mother    Hyperlipidemia Mother    Osteoporosis Mother    Hypertension Mother    Depression Father    Anxiety  disorder Father    BRCA 1/2 Neg Hx    Breast cancer Neg Hx    Colitis Neg Hx    Esophageal cancer Neg Hx    Pancreatic cancer Neg Hx    Stomach cancer Neg Hx    Past Surgical History:  Procedure Laterality Date   AUGMENTATION MAMMAPLASTY Bilateral    breast enhancement & removal   BREAST IMPLANT REMOVAL Bilateral    CYST REMOVAL HAND Left 2025   ganglion cyst   REFRACTIVE SURGERY Bilateral    Social History   Occupational History   Not on file  Tobacco Use   Smoking status: Never    Passive exposure: Never   Smokeless tobacco: Never  Vaping Use   Vaping status: Never Used  Substance and Sexual Activity   Alcohol use: Not Currently   Drug use: Never   Sexual activity: Yes    Partners: Male    Birth control/protection: OCP   I spent 42 minutes in the care of the patient today including face-to-face time, preparation to see the patient, as well as review and independent interpretation of previous lumbar spine MRI from 11/17/2022, review of right ankle MRI and right hip MRI with contrast on 01/07/24, review of previous orthopedic and sports medicine notes, review of medication and prescription for medication, diagnostic workup and criteria for EDS, discussion on nerve conduction studies and future follow-up, alternative treatment options such as specialized dry needling and soft tissue treatment with Norvel Piedra for the above diagnoses.   Lonell Sprang, DO Primary Care Sports Medicine Physician  [facility]  This note was dictated using Dragon naturally speaking software and may contain errors in syntax, spelling, or content which have not been identified prior to signing this note.

## 2024-02-21 ENCOUNTER — Encounter: Payer: Self-pay | Admitting: Sports Medicine

## 2024-02-24 ENCOUNTER — Encounter: Payer: Self-pay | Admitting: Dermatology

## 2024-02-24 DIAGNOSIS — L700 Acne vulgaris: Secondary | ICD-10-CM

## 2024-02-24 MED ORDER — ISOTRETINOIN 10 MG PO CAPS: 10.0000 mg | ORAL_CAPSULE | Freq: Two times a day (BID) | ORAL | 0 refills | Status: DC

## 2024-02-24 MED ORDER — ISOTRETINOIN 10 MG PO CAPS: 10.0000 mg | ORAL_CAPSULE | Freq: Every day | ORAL | 0 refills | Status: DC

## 2024-02-25 ENCOUNTER — Ambulatory Visit (INDEPENDENT_AMBULATORY_CARE_PROVIDER_SITE_OTHER): Admitting: Podiatry

## 2024-02-25 ENCOUNTER — Ambulatory Visit (INDEPENDENT_AMBULATORY_CARE_PROVIDER_SITE_OTHER)

## 2024-02-25 ENCOUNTER — Encounter: Payer: Self-pay | Admitting: Podiatry

## 2024-02-25 VITALS — BP 118/76 | HR 74 | Temp 97.5°F | Resp 16 | Ht 62.5 in | Wt 112.0 lb

## 2024-02-25 DIAGNOSIS — M25571 Pain in right ankle and joints of right foot: Secondary | ICD-10-CM

## 2024-02-25 DIAGNOSIS — G8929 Other chronic pain: Secondary | ICD-10-CM

## 2024-02-25 DIAGNOSIS — M2041 Other hammer toe(s) (acquired), right foot: Secondary | ICD-10-CM

## 2024-02-25 NOTE — Patient Instructions (Signed)
 Hammer Toe Hammer toe is a change in the shape, or a deformity, of the toe. The deformity causes the middle joint of the toe to stay bent. Hammer toe starts gradually. At first, the toe can be straightened. Then over time, the toe deformity becomes stiff, inflexible, and permanently bent. Hammer toe usually affects the second, third, or fourth toe. A hammer toe causes pain, especially when wearing shoes. Corns and calluses can result from the toe rubbing against the inside of the shoe. Early treatments to keep the toe straight may relieve pain. As the deformity of the toe becomes stiff and permanent, surgery may be needed to straighten the toe. What are the causes? This condition is caused by abnormal bending of the toe joint that is closest to your foot. Over time, the toe bending downward pulls on the muscles and connections (tendons) of the toe joint, making them weak and stiff. Wearing shoes that are too narrow in the toe box and do not allow toes to fully straighten can cause this condition. What increases the risk? You are more likely to develop this condition if you: Are an older female. Wear shoes that are too small, or wear high-heeled shoes that pinch your toes. Have a second toe that is longer than your big toe (first toe). Injure your foot or toe. Have arthritis, or have a nerve or muscle disorder. Have diabetes or a condition known as Charcot joint, which may cause you to walk abnormally. Have a family history of hammer toe. Are a Advertising account planner. What are the signs or symptoms? Pain and deformity of the toe are the main symptoms of this condition. The pain is worse when wearing shoes, walking, or running. Other symptoms may include: A thickened patch of skin, called a corn or callus, that forms over the top of the bent part of the toe or between the toes. Redness and a burning feeling on the bent toe. An open sore that forms on the top of the bent toe. Not being able to straighten  the affected toe. How is this diagnosed? This condition is diagnosed based on your symptoms and a physical exam. During the exam, your health care provider will try to straighten your toe to see how stiff the deformity is. You may also have tests, such as: A blood test to check for rheumatoid arthritis or diabetes. An X-ray to show how severe the toe deformity is. How is this treated? Treatment for this condition depends on whether the toe is flexible or deformed and no longer moveable. In less severe cases, a hammer toe can be straightened without surgery. These treatments include: Taping the toe into a straightened position. Using pads and cushions to protect the bent toe. Wearing shoes that provide enough room for the toes. Doing toe-stretching exercises at home. Taking an NSAID, such as ibuprofen, to reduce pain and swelling. Using special orthotics or insoles for pain relief and to improve walking. If these treatments do not help or the toe has a severe deformity and cannot be straightened, surgery is the next option. The most common surgeries used to straighten a hammer toe include: Arthroplasty or osteotomy. Part of the toe joint is reconstructed or removed, which allows the toe to straighten. Fusion. Cartilage between the two bones of the joint is taken out, and the bones are fused together into one longer bone. Implantation. Part of the bone is removed and replaced with an implant to allow the toe to move again. Flexor tendon transfer.  The tendons that curl the toes down (flexor tendons) are repositioned. Follow these instructions at home: Take over-the-counter and prescription medicines only as told by your health care provider. Do toe-straightening and stretching exercises as told by your health care provider. Keep all follow-up visits. This is important. How is this prevented? Wear shoes that fit properly and give your toes enough room. Shoes should not cause pain. Buy shoes at  the end of the day to make sure they fit well, since your foot may swell during the day. Make sure they are comfortable before you buy them. As you age, your shoe size might change, including the width. Measure both feet and buy shoes for the larger foot. A shoe repair store might be able to stretch shoes that feel tight in spots. Do not wear high-heeled shoes or shoes with pointed toes. Contact a health care provider if: Your pain gets worse. Your toe becomes red or swollen. You develop an open sore on your toe. Summary Hammer toe is a condition that gradually causes your toe to become bent and stiff. Hammer toe can be treated by taping the toe into a straightened position and doing toe-stretching exercises. If these treatments do not help, surgery may be needed. To prevent this condition, wear shoes that fit properly, give your toes enough room, and do not cause pain. This information is not intended to replace advice given to you by your health care provider. Make sure you discuss any questions you have with your health care provider. Document Revised: 11/04/2019 Document Reviewed: 11/05/2019 Elsevier Patient Education  2024 [employer].

## 2024-02-26 ENCOUNTER — Ambulatory Visit

## 2024-02-26 DIAGNOSIS — R7989 Other specified abnormal findings of blood chemistry: Secondary | ICD-10-CM | POA: Diagnosis not present

## 2024-02-26 DIAGNOSIS — Z8262 Family history of osteoporosis: Secondary | ICD-10-CM

## 2024-02-27 ENCOUNTER — Ambulatory Visit

## 2024-02-27 NOTE — Progress Notes (Signed)
 Patient brought orthotics in from last year has not been able to wear as they are too rigid and making patient feel over supinated also would like MT pads added  I am sending back to [facility] adding MT pads once back I will thin out arch to allow more flexion and natural pronation to help reduce supination  Lolita Schultze CPed, CFo, CFm   Patient will need appointment for fitting

## 2024-02-29 NOTE — Progress Notes (Signed)
 Subjective:  Patient ID: Delon Josiah Ends, female    DOB: 01-May-1981,  MRN: [id number]  Chief Complaint  Patient presents with   Foot Pain    RM14: Ongoing pain in right foot (ankle, toes) has xray images and MRI     Discussed the use of AI scribe software for clinical note transcription with the patient, who gave verbal consent to proceed.  History of Present Illness Franci Oshana is a 43 year old female who presents with right foot and ankle pain and numbness.  This is currently being managed by other providers as well as she has a nerve conduction test pending.  She has experienced right foot and ankle pain for over a year, with a dull ache radiating from the Achilles tendon to the big toe, causing difficulty in gripping the floor while walking.   Her main concern is the fourth toe is starting to curve under.  She can feel this when she walks.  An MRI of the ankle and an x-ray of the foot have been performed. The ankle feels swollen, frequently cracks and pops, and has a sensation of warmth with reduced numbness when it pops. Swelling is noted compared to the other side.  Numbness extends down the front and back of her leg, potentially related to hip issues. She is undergoing physical therapy and has tried shockwave therapy on her hip. A nerve test is scheduled for August.  She is taking meloxicam  and has tried over-the-counter toe spreaders without improvement. Orthotics were made but are uncomfortable and lack metatarsal support. No injuries were noted at the onset of symptoms. Nerve-related symptoms are worsening. Pain is present in the joint when walking, but not at rest. No shooting pain into the toes.      Objective:    Physical Exam General: AAO x3, NAD  Dermatological: Skin is warm, dry and supple bilateral.  There are no open sores, no preulcerative lesions, no rash or signs of infection present.  Vascular: Dorsalis Pedis artery and Posterior Tibial artery  pedal pulses are 2/4 bilateral with immedate capillary fill time. There is no pain with calf compression, swelling, warmth, erythema.   Neruologic: Grossly intact via light touch bilateral.  Sensation intact with Semmes Weinstein monofilament.  Musculoskeletal: There is adductovarus, digital contracture most notably of the right fourth digit.  There is no specific area pinpoint tenderness otherwise.  Some slight edema to the ankle but is no erythema or warmth.  Gait: Unassisted, Nonantalgic.         Results    Assessment:   1. Chronic pain of right ankle   2. Hammertoe of right foot      Plan:  Patient was evaluated and treated and all questions answered.  Assessment and Plan Assessment & Plan Chronic right foot and ankle pain with nerve involvement Chronic pain with nerve involvement, worsening symptoms, differential includes nerve dysfunction possibly due to hip issues. Awaiting nerve conduction study results. - Continue meloxicam  for pain management. - Proceed with scheduled nerve conduction study in August. - Consider referral to neurologist if no improvement post-study. - She is currently being treated by other providers for this as well so we will defer the treatment to them.   Right foot hammer toe-Main concern and focus today. Hammer toe with discomfort due to altered gait from pain and nerve issues. Conservative management preferred due to past poor surgical outcomes. - Continue using toe spacers. - Engage in toe exercises like 'Toe Yoga'. - Modify orthotics with  metatarsal support.  She will bring the inserts in so we can further modify this for her. - Consider surgical intervention if conservative measures fail in the future.    Donnice JONELLE Fees DPM

## 2024-03-03 ENCOUNTER — Ambulatory Visit: Payer: Self-pay | Admitting: Obstetrics and Gynecology

## 2024-03-15 ENCOUNTER — Other Ambulatory Visit: Payer: Self-pay | Admitting: Sports Medicine

## 2024-03-20 ENCOUNTER — Ambulatory Visit: Admitting: Neurology

## 2024-03-20 DIAGNOSIS — R202 Paresthesia of skin: Secondary | ICD-10-CM

## 2024-03-20 NOTE — Procedures (Signed)
  [facility]  [address] [place], [address]  [place], [place] [address] Tel: [phone] Fax: [phone] Test Date:  03/20/2024  Patient: Marie Hanson DOB: [birth year] Physician: Tonita Blanch, DO  Sex: Female Height: 5' 2 Ref Phys: Artist CANDIE Lloyd, MD  ID#: [id number]   Technician:    History: This is a 43 year old female referred for evaluation of right lower extremity paresthesias.  NCV & EMG Findings: Extensive electrodiagnostic testing of the right lower extremity shows: Right sural and superficial peroneal sensory responses are within normal limits. Right peroneal and tibial motor responses are within normal limits. Right tibial H reflex study is within normal limits. There is no evidence of active or chronic motor axonal loss changes affecting any of the tested muscles.  Motor unit configuration and recruitment pattern is within normal limits.  Impression: This is a normal study of the right lower extremity.  In particular, there is no evidence of a large fiber sensorimotor polyneuropathy or lumbosacral radiculopathy.   ___________________________ Tonita Blanch, DO    Nerve Conduction Studies   Stim Site NR Peak (ms) Norm Peak (ms) O-P Amp (V) Norm O-P Amp  Right Sup Peroneal Anti Sensory (Ant Lat Mall)  32 C  12 cm    2.5 <4.5 19.9 >5  Right Sural Anti Sensory (Lat Mall)  32 C  Calf    2.6 <4.5 33.5 >5     Stim Site NR Onset (ms) Norm Onset (ms) O-P Amp (mV) Norm O-P Amp Site1 Site2 Delta-0 (ms) Dist (cm) Vel (m/s) Norm Vel (m/s)  Right Peroneal Motor (Ext Dig Brev)  32 C  Ankle    3.3 <5.5 4.5 >3 B Fib Ankle 6.3 33.0 52 >40  B Fib    9.6  4.3  Poplt B Fib 1.3 8.0 62 >40  Poplt    10.9  4.3         Right Tibial Motor (Abd Hall Brev)  32 C  Ankle    3.9 <6.0 19.7 >8 Knee Ankle 7.1 39.0 55 >40  Knee    11.0  16.0          Electromyography   Side Muscle Ins.Act Fibs Fasc Recrt Amp Dur Poly Activation Comment  Right AntTibialis Nml Nml Nml Nml Nml  Nml Nml Nml N/A  Right Gastroc Nml Nml Nml Nml Nml Nml Nml Nml N/A  Right Flex Dig Long Nml Nml Nml Nml Nml Nml Nml Nml N/A  Right RectFemoris Nml Nml Nml Nml Nml Nml Nml Nml N/A  Right GluteusMed Nml Nml Nml Nml Nml Nml Nml Nml N/A  Right AdductorLong Nml Nml Nml Nml Nml Nml Nml Nml N/A  Right Ext Dig Brev Nml Nml Nml Nml Nml Nml Nml Nml N/A      Waveforms:

## 2024-03-23 ENCOUNTER — Ambulatory Visit: Admitting: Dermatology

## 2024-03-23 ENCOUNTER — Encounter: Payer: Self-pay | Admitting: Dermatology

## 2024-03-23 ENCOUNTER — Ambulatory Visit (HOSPITAL_BASED_OUTPATIENT_CLINIC_OR_DEPARTMENT_OTHER): Attending: Orthopaedic Surgery | Admitting: Physical Therapy

## 2024-03-23 ENCOUNTER — Other Ambulatory Visit: Payer: Self-pay

## 2024-03-23 ENCOUNTER — Encounter (HOSPITAL_BASED_OUTPATIENT_CLINIC_OR_DEPARTMENT_OTHER): Payer: Self-pay | Admitting: Physical Therapy

## 2024-03-23 VITALS — BP 105/70 | HR 60 | Wt 115.0 lb

## 2024-03-23 DIAGNOSIS — M6281 Muscle weakness (generalized): Secondary | ICD-10-CM | POA: Diagnosis not present

## 2024-03-23 DIAGNOSIS — M5410 Radiculopathy, site unspecified: Secondary | ICD-10-CM | POA: Insufficient documentation

## 2024-03-23 DIAGNOSIS — L309 Dermatitis, unspecified: Secondary | ICD-10-CM | POA: Diagnosis not present

## 2024-03-23 DIAGNOSIS — T50995A Adverse effect of other drugs, medicaments and biological substances, initial encounter: Secondary | ICD-10-CM | POA: Diagnosis not present

## 2024-03-23 DIAGNOSIS — Z79899 Other long term (current) drug therapy: Secondary | ICD-10-CM

## 2024-03-23 DIAGNOSIS — L700 Acne vulgaris: Secondary | ICD-10-CM | POA: Diagnosis not present

## 2024-03-23 DIAGNOSIS — G8929 Other chronic pain: Secondary | ICD-10-CM | POA: Diagnosis not present

## 2024-03-23 DIAGNOSIS — K130 Diseases of lips: Secondary | ICD-10-CM

## 2024-03-23 DIAGNOSIS — M25551 Pain in right hip: Secondary | ICD-10-CM | POA: Insufficient documentation

## 2024-03-23 DIAGNOSIS — Q796 Ehlers-Danlos syndrome, unspecified: Secondary | ICD-10-CM | POA: Diagnosis not present

## 2024-03-23 DIAGNOSIS — Z7189 Other specified counseling: Secondary | ICD-10-CM

## 2024-03-23 MED ORDER — ISOTRETINOIN 10 MG PO CAPS: 10.0000 mg | ORAL_CAPSULE | Freq: Every day | ORAL | 0 refills | Status: DC

## 2024-03-23 NOTE — Therapy (Signed)
 OUTPATIENT PHYSICAL THERAPY EVALUATION   Patient Name: Marie Hanson MRN: [id number] DOB:02-Sep-1980, 43 y.o., female Today's Date: 03/23/2024  END OF SESSION:  PT End of Session - 03/23/24 1401     Visit Number 1    Number of Visits 16    Date for PT Re-Evaluation 05/23/24    Authorization Type [employer]    PT Start Time 1315    PT Stop Time 1400    PT Time Calculation (min) 45 min    Activity Tolerance Patient tolerated treatment well    Behavior During Therapy WFL for tasks assessed/performed          Past Medical History:  Diagnosis Date   Allergy     Hay fever, pollen, mold etc.   Anal fissure    Anxiety    Cardiac arrhythmia    during chidlhood   Depression    Eczema    Nipple pain x's 2 months   Rosacea    Urticaria    Past Surgical History:  Procedure Laterality Date   AUGMENTATION MAMMAPLASTY Bilateral    breast enhancement & removal   BREAST IMPLANT REMOVAL Bilateral    CYST REMOVAL HAND Left 2025   ganglion cyst   REFRACTIVE SURGERY Bilateral    Patient Active Problem List   Diagnosis Date Noted   Allergic rhinitis, unspecified 01/22/2024   Chronic right hip pain 01/07/2024   Anogenital HSV infection 08/13/2023   Hot flashes 07/26/2023   Vitamin B12 deficiency 06/20/2023   Flushing 06/03/2023   Chronic urticaria 03/18/2023   Acne vulgaris 03/18/2023   Seasonal and perennial allergic rhinitis 10/24/2022   Allergic conjunctivitis of both eyes 10/24/2022   Nickel allergy  10/24/2022   Raynaud's phenomenon 06/18/2022   Rosacea 03/05/2019   Anxiety 03/05/2019      REFERRING PROVIDER:  Genelle Standing, MD         REFERRING DIAG:  [phone] (ICD-10-CM) - Chronic right hip pain  S73.191D (ICD-10-CM) - Tear of right acetabular labrum, subsequent encounter  M54.10 (ICD-10-CM) - Radicular pain of right lower extremity  Q79.60 (ICD-10-CM) - Ehlers-Danlos syndrome    Rationale for Evaluation and Treatment: Rehabilitation  THERAPY  DIAG:  Pain in right hip  Muscle weakness (generalized)  ONSET DATE: 7-8 years ago   SUBJECTIVE:                                                                                                                                                                                           SUBJECTIVE STATEMENT: Maybe 7-8 years ago, Rt groin has been very tight. Had COVID and ended up with odd symptoms. After I had it  again it seemed like all of my joints hurt and inflammation overall is up. Did have some odd findings at rheumatologist and f/u in Oct. I have a lot of cracking. If I can get a crack on the front of my ankle I feel lke the blood flows into the foot. Denies long term results from injection into hip or L4/5. Rt leg feels very weak and Rt doesn't seem to engage.  I used to do a lot of cardio, ballet, heavy weight lifting. Backed down to [employer], pilates.  Anterior pelvic tilt and weak lower core.  Rt ankle lateral rolling due to great toe numbness.  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 No Occasional pinching in Right leg The weakness and numbness bothers me most  PRECAUTIONS:  None  RED FLAGS: None   WEIGHT BEARING RESTRICTIONS:  No  FALLS:  Has patient fallen in last 6 months? No   OCCUPATION:  N/A  PLOF:  Independent  PATIENT GOALS:  Back to workouts   OBJECTIVE:  Note: Objective measures were completed at Evaluation unless otherwise noted.  PATIENT SURVEYS:  Not completed at eval  COGNITIVE STATUS: Within functional limits for tasks assessed   SENSATION: Eval: lacking proprioception for muscle contraction  POSTURE:  EVAL: pt sits with upright posture and decreased spinal curvature, anterior pelvic tilt  GAIT: Comments: WFL   Body Part #1 Hip   LOWER EXTREMITY MMT:    MMT  Right eval Left eval  Hip flexion 26.9 33.1  Hip extension- qped knee ext, behind knee 18.0 31.0  Hip abduction- SL at knee 31.0 53.7  Hip adduction    Hip internal rotation    Hip external rotation    Knee flexion 17.2 32.0  Knee extension 44.2 57.2  Ankle dorsiflexion    Ankle plantarflexion    Ankle inversion    Ankle eversion     (Blank rows = not tested)                                                                                                                               TREATMENT DATE:   03/23/24 EVAL Supine core progression- march, extension, SLR SL hip abd, circles with core engagement Prone hip ext with knee flexed- tactile cuing. Bridge + march   PATIENT EDUCATION:  Education details: Teacher, music of condition, POC, HEP, exercise form/rationale  Person educated: Patient Education method: Explanation, Demonstration, Tactile cues, Verbal cues, and Handouts Education comprehension: verbalized understanding, returned demonstration, verbal cues required, tactile cues required, and needs further education  HOME EXERCISE PROGRAM: [id number]   ASSESSMENT:  CLINICAL IMPRESSION: Patient is a 43 y.o. F who was seen today for physical therapy evaluation and treatment for Rt hip pain. Pt with very limited proprioception of muscular contraction but did well with  tactile cuing today. Will benefit from skilled PT to improve coordinated contraction to return strength and balance to Rt LE.      PERSONAL FACTORS: see pertinent history are also affecting patient's functional outcome.   REHAB POTENTIAL: Good  CLINICAL DECISION MAKING: Evolving/moderate complexity  EVALUATION COMPLEXITY: Moderate   GOALS: Goals reviewed with patient? Yes  SHORT TERM GOALS: Target date: 9/6  Marching bridge and maintaining a level pelvis Baseline: Goal status: INITIAL  2.  Maintain core contraction in quadruped position with extremity movements.  Baseline:  Goal status:  INITIAL    LONG TERM GOALS: Target date: POC date  LE strength 90% of opposite LE via dynamometry or tindeq testing Baseline:  Goal status: INITIAL  2.  Single leg stability in dynamic balance Baseline:  Goal status: INITIAL  3.  Progressing into plyometric motions with good form Baseline:  Goal status: INITIAL  4.  Prepared for progression of weight lifting without increase in pain Baseline:  Goal status: INITIAL     PLAN:  PT FREQUENCY: 1-2x/week  PT DURATION: POC Date  PLANNED INTERVENTIONS: 97164- PT Re-evaluation, 97750- Physical Performance Testing, 97110-Therapeutic exercises, 97530- Therapeutic activity, [id number]- Neuromuscular re-education, 97535- Self Care, 02859- Manual therapy, [id number]- Aquatic Therapy, [phone] (1-2 muscles), 20561 (3+ muscles)- Dry Needling, Patient/Family education, Balance training, Stair training, Taping, Joint mobilization, Spinal mobilization, and Cryotherapy.  PLAN FOR NEXT SESSION: continue with muscle contraction coordination   Harlene Cordon, PT, DPT 03/23/2024, 9:01 PM

## 2024-03-23 NOTE — Patient Instructions (Addendum)
 Date: Mon Mar 23 2024  Hello Marie Hanson,  Thank you for visiting today. Here is a summary of the key instructions:  - Medications:   - Continue taking 10 mg of Isotretinoin  daily for 5 weeks   - Take Isotretinoin  with a meal containing healthy fat for better absorption   - Mix hydrocortisone with Avene's Cicalfate balm (sample given)   - Apply mixture to eyelid morning and night for 10 days   - After 10 days, use only Cicalfate balm on eyelid  - Skin Care:   - Use Aveeno oat gel as a moisturizer   - Apply Cicalfate balm at night for extra hydration   - Use Avene's Tolerance cream in the morning   - Apply Aquaphor body balm to lips   - Wear sunscreen daily   - Use a hat and seek shade when outdoors  - Follow-up:   - Next appointment in 4 weeks  Please reach out if you have any questions or concerns.  Warm regards,  Dr. [facility]     Important Information   Due to recent changes in healthcare laws, you may see results of your pathology and/or laboratory studies on MyChart before the doctors have had a chance to review them. We understand that in some cases there may be results that are confusing or concerning to you. Please understand that not all results are received at the same time and often the doctors may need to interpret multiple results in order to provide you with the best plan of care or course of treatment. Therefore, we ask that you please give us  2 business days to thoroughly review all your results before contacting the office for clarification. Should we see a critical lab result, you will be contacted sooner.     If You Need Anything After Your Visit   If you have any questions or concerns for your doctor, please call our main line at [phone]. If no one answers, please leave a voicemail as directed and we will return your call as soon as possible. Messages left after 4 pm will be answered the following business day.    You may also send us  a  message via MyChart. We typically respond to MyChart messages within 1-2 business days.  For prescription refills, please ask your pharmacy to contact our office. Our fax number is [phone].  If you have an urgent issue when the clinic is closed that cannot wait until the next business day, you can page your doctor at the number below.     Please note that while we do our best to be available for urgent issues outside of office hours, we are not available 24/7.    If you have an urgent issue and are unable to reach us , you may choose to seek medical care at your doctor's office, retail clinic, urgent care center, or emergency room.   If you have a medical emergency, please immediately call 911 or go to the emergency department. In the event of inclement weather, please call our main line at [phone] for an update on the status of any delays or closures.  Dermatology Medication Tips: Please keep the boxes that topical medications come in in order to help keep track of the instructions about where and how to use these. Pharmacies typically print the medication instructions only on the boxes and not directly on the medication tubes.   If your medication is too expensive, please contact our office at [phone] or  send us  a message through MyChart.    We are unable to tell what your co-pay for medications will be in advance as this is different depending on your insurance coverage. However, we may be able to find a substitute medication at lower cost or fill out paperwork to get insurance to cover a needed medication.    If a prior authorization is required to get your medication covered by your insurance company, please allow us  1-2 business days to complete this process.   Drug prices often vary depending on where the prescription is filled and some pharmacies may offer cheaper prices.   The website www.goodrx.com contains coupons for medications through different pharmacies. The  prices here do not account for what the cost may be with help from insurance (it may be cheaper with your insurance), but the website can give you the price if you did not use any insurance.  - You can print the associated coupon and take it with your prescription to the pharmacy.  - You may also stop by our office during regular business hours and pick up a GoodRx coupon card.  - If you need your prescription sent electronically to a different pharmacy, notify our office through [facility] or by phone at [phone]

## 2024-03-23 NOTE — Progress Notes (Signed)
 Isotretinoin  Follow-Up Visit   Subjective  Marie Hanson is a 43 y.o. female who presents for the following: Isotretinoin  follow-up  Day # 5, 10 mg per day   Isotretinoin  F/U - 03/23/24 1000       Isotretinoin  Follow Up   iPledge # [id number]    Date 03/23/24    Weight 115 lb (52.2 kg)    Two Forms of Birth Control Oral Contraceptives (w/ estrogen);Female Vasectomy    Acne breakouts since last visit? Yes      Dosage   Target Dosage (mg) 7500    Current (To Date) Dosage (mg) 50    To Go Dosage (mg) 7450      Skin Side Effects   Dry Lips No    Nose bleeds No    Dry eyes Yes    Dry Skin Yes    Sunburn No      Gastrointestinal Side Effects   Nausea No    Diarrhea No    Blood in stool No      Neurological Side Effects   Blurred vision No    Depression No    Headache No    Homicidal thoughts No    Mood Changes No    Suicidal thoughts No      Constitutional Side Effects   Fatigue No      Musculoskeletal Side Effects   Muscle aches No      Other Side Effects   Other Side Effects No      Labs Notes   Last labs done 03/23/24   Urine Pregnancy   Lab comment Negative         Side effects: Dry skin, dry lips  Patient is not pregnant, not seeking pregnancy, and not breastfeeding.  The following portions of the chart were reviewed this encounter and updated as appropriate: medications, allergies, medical history  Review of Systems:  No other skin or systemic complaints except as noted in HPI or Assessment and Plan.  Objective  Well appearing patient in no apparent distress; mood and affect are within normal limits.  An examination of the face, neck, chest, and back was performed and relevant findings are noted below.            Assessment & Plan   ACNE VULGARIS Patient is currently on Isotretinoin  requiring FDA mandated monthly evaluations and laboratory monitoring. Condition is currently not to goal (must reach target dose based on weight  and also have clear skin for 2 months prior to discontinuation in order to help prevent relapse)  Exam findings: None  Day # 5 Pharmacy [facility] iPLEDGE: # [id number]  Total mg -  50 mg Total mg/kg - 1 mg /kg Birth Control- Oral Bitrh Control and Vasectomy  Continue isotretinoin  10 mg, Daily  Urine pregnancy test performed in office today and was negative.  Patient demonstrates comprehension and confirms she will not get pregnant.   Patient confirmed in iPledge and isotretinoin  sent to pharmacy.   Isotretinoin  Counseling; Review and Contraception Counseling: Reviewed potential side effects of isotretinoin  including xerosis, cheilitis, hepatitis, hyperlipidemia, and severe birth defects if taken by a pregnant woman.  Women on isotretinoin  must be celibate (not having sex) or required to use at least 2 birth control methods to prevent pregnancy (unless patient is a female of non-child bearing potential).  Females of child-bearing potential must have monthly pregnancy tests while on isotretinoin  and report through I-Pledge (FDA monitoring program). Reviewed reports of suicidal ideation in those with  a history of depression while taking isotretinoin  and reports of diagnosis of inflammatory bowl disease (IBD) while taking isotretinoin  as well as the lack of evidence for a causal relationship between isotretinoin , depression and IBD. Patient advised to reach out with any questions or concerns. Patient advised not to share pills or donate blood while on treatment or for one month after completing treatment. All patient's considering Isotretinoin  must read and understand and sign Isotretinoin  Consent Form and be registered with I-Pledge.  Cheilitis secondary to isotretinoin  therapy - Continue lip balm as directed, Aquaphor body balm stick recommended  Long term medication management (isotretinoin )  Patient is using long term (months to years) prescription medication  to control their dermatologic  condition.  These medications require periodic monitoring to evaluate for efficacy and side effects and may require periodic laboratory monitoring.  - While taking Isotretinoin  and for 30 days after you finish the medication, do not get pregnant, do not share pills, do not donate blood. Isotretinoin  is best absorbed when taken with a fatty meal. Isotretinoin  can make you sensitive to the sun. Daily careful sun protection including sunscreen SPF 30+ when outdoors is recommended.   Eyelid Dermatitis - Assessment: New, itchy, red lesion on the eyelid that appeared 3 weeks ago. Initially presented with a whitehead-like appearance and is now slightly crusty. Appears consistent with either milia or irritant dermatitis. Previous 3-day trial of hydrocortisone was ineffective. - Plan:    Apply mixture of hydrocortisone and Avene Cicalfate balm to the lesion twice daily for 10 days    After 10 days, continue with Cicalfate balm alone    Monitor for resolution    Reassure patient that the lesion does not appear dangerous ACNE VULGARIS   Related Medications YASMIN  28 3-0.03 MG tablet Take 1 tablet by mouth daily. isotretinoin  (ACCUTANE ) 10 MG capsule Take 1 capsule (10 mg total) by mouth daily. iPLEDGE REMS ID: [id number]  Follow-up in 30 days.  I, Jetta Ager, am acting as Neurosurgeon for [employer], DO.  Documentation: I have reviewed the above documentation for accuracy and completeness, and I agree with the above.  Delon Lenis, DO

## 2024-03-25 ENCOUNTER — Encounter: Payer: Self-pay | Admitting: Obstetrics and Gynecology

## 2024-03-25 DIAGNOSIS — L700 Acne vulgaris: Secondary | ICD-10-CM

## 2024-03-25 DIAGNOSIS — R232 Flushing: Secondary | ICD-10-CM

## 2024-03-26 ENCOUNTER — Telehealth: Payer: Self-pay

## 2024-03-26 MED ORDER — YASMIN 28 3-0.03 MG PO TABS: 1.0000 | ORAL_TABLET | Freq: Every day | ORAL | 1 refills | Status: DC

## 2024-03-26 NOTE — Telephone Encounter (Signed)
 Patient called for a status update for orthotics she dropped off with office. Thanks

## 2024-03-26 NOTE — Telephone Encounter (Signed)
 Marie Hanson  Rx sent on 10/25/23, #84/4 RF  Rx updated to #112/3RF

## 2024-03-27 NOTE — Telephone Encounter (Signed)
 Routing to [place] for PA.

## 2024-03-31 NOTE — Telephone Encounter (Signed)
 Orthotics arrived today. I will call pt to schedule fitting

## 2024-04-02 ENCOUNTER — Ambulatory Visit: Payer: Self-pay | Admitting: Family Medicine

## 2024-04-02 NOTE — Progress Notes (Signed)
 Nerve conduction study looks normal.

## 2024-04-06 ENCOUNTER — Encounter (HOSPITAL_BASED_OUTPATIENT_CLINIC_OR_DEPARTMENT_OTHER): Payer: Self-pay | Admitting: Physical Therapy

## 2024-04-06 ENCOUNTER — Ambulatory Visit (HOSPITAL_BASED_OUTPATIENT_CLINIC_OR_DEPARTMENT_OTHER): Admitting: Physical Therapy

## 2024-04-06 DIAGNOSIS — M25551 Pain in right hip: Secondary | ICD-10-CM | POA: Diagnosis not present

## 2024-04-06 DIAGNOSIS — M542 Cervicalgia: Secondary | ICD-10-CM

## 2024-04-06 DIAGNOSIS — M6281 Muscle weakness (generalized): Secondary | ICD-10-CM

## 2024-04-06 NOTE — Therapy (Signed)
 OUTPATIENT PHYSICAL THERAPY EVALUATION   Patient Name: Marie Hanson MRN: [id number] DOB:Aug 05, 1981, 43 y.o., female Today's Date: 04/06/2024  END OF SESSION:  PT End of Session - 04/06/24 1020     Visit Number 2    Number of Visits 16    Date for PT Re-Evaluation 05/23/24    Authorization Type [employer]    PT Start Time 1020    PT Stop Time 1101    PT Time Calculation (min) 41 min    Activity Tolerance Patient tolerated treatment well    Behavior During Therapy WFL for tasks assessed/performed          Past Medical History:  Diagnosis Date   Allergy     Hay fever, pollen, mold etc.   Anal fissure    Anxiety    Cardiac arrhythmia    during chidlhood   Depression    Eczema    Nipple pain x's 2 months   Rosacea    Urticaria    Past Surgical History:  Procedure Laterality Date   AUGMENTATION MAMMAPLASTY Bilateral    breast enhancement & removal   BREAST IMPLANT REMOVAL Bilateral    CYST REMOVAL HAND Left 2025   ganglion cyst   REFRACTIVE SURGERY Bilateral    Patient Active Problem List   Diagnosis Date Noted   Allergic rhinitis, unspecified 01/22/2024   Chronic right hip pain 01/07/2024   Anogenital HSV infection 08/13/2023   Hot flashes 07/26/2023   Vitamin B12 deficiency 06/20/2023   Flushing 06/03/2023   Chronic urticaria 03/18/2023   Acne vulgaris 03/18/2023   Seasonal and perennial allergic rhinitis 10/24/2022   Allergic conjunctivitis of both eyes 10/24/2022   Nickel allergy  10/24/2022   Raynaud's phenomenon 06/18/2022   Rosacea 03/05/2019   Anxiety 03/05/2019      REFERRING PROVIDER:  Genelle Standing, MD         REFERRING DIAG:  [phone] (ICD-10-CM) - Chronic right hip pain  S73.191D (ICD-10-CM) - Tear of right acetabular labrum, subsequent encounter  M54.10 (ICD-10-CM) - Radicular pain of right lower extremity  Q79.60 (ICD-10-CM) - Ehlers-Danlos syndrome    Rationale for Evaluation and Treatment: Rehabilitation  THERAPY  DIAG:  Pain in right hip  Muscle weakness (generalized)  Cervicalgia  ONSET DATE: 7-8 years ago   SUBJECTIVE:                                                                                                                                                                                           SUBJECTIVE STATEMENT: Core feels a little stronger but no change in leg.   EVAL: Maybe 7-8 years ago, Rt groin has  been very tight. Had COVID and ended up with odd symptoms. After I had it again it seemed like all of my joints hurt and inflammation overall is up. Did have some odd findings at rheumatologist and f/u in Oct. I have a lot of cracking. If I can get a crack on the front of my ankle I feel lke the blood flows into the foot. Denies long term results from injection into hip or L4/5. Rt leg feels very weak and Rt doesn't seem to engage.  I used to do a lot of cardio, ballet, heavy weight lifting. Backed down to [employer], pilates.  Anterior pelvic tilt and weak lower core.  Rt ankle lateral rolling due to great toe numbness.  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 No Occasional pinching in Right leg The weakness and numbness bothers me most  PRECAUTIONS:  None  RED FLAGS: None   WEIGHT BEARING RESTRICTIONS:  No  FALLS:  Has patient fallen in last 6 months? No   OCCUPATION:  N/A  PLOF:  Independent  PATIENT GOALS:  Back to workouts   OBJECTIVE:  Note: Objective measures were completed at Evaluation unless otherwise noted.  PATIENT SURVEYS:  Not completed at eval  COGNITIVE STATUS: Within functional limits for tasks assessed   SENSATION: Eval: lacking proprioception for muscle contraction  POSTURE:  EVAL: pt sits with upright posture and decreased spinal curvature, anterior pelvic  tilt  GAIT: Comments: WFL   Body Part #1 Hip   LOWER EXTREMITY MMT:    MMT Right eval Left eval  Hip flexion 26.9 33.1  Hip extension- qped knee ext, behind knee 18.0 31.0  Hip abduction- SL at knee 31.0 53.7  Hip adduction    Hip internal rotation    Hip external rotation    Knee flexion 17.2 32.0  Knee extension 44.2 57.2  Ankle dorsiflexion    Ankle plantarflexion    Ankle inversion    Ankle eversion     (Blank rows = not tested)                                                                                                                               TREATMENT DATE:   8/25 STM Rt pectineus, checked lateral hip with no TTP noted Hip hike 2 step Knee drive from lunge & qped resisted by red tband Eversion yellow tband knees at 90 and feet not touching floor Long sitting inversion squeeze on ball Heel raises with ball bw ankles Supine double leg lower with yellow tband around feet Supine TT to press out hollow body yellow tband around feet  03/23/24 EVAL Supine core progression- march, extension, SLR SL hip abd, circles with core engagement Prone hip ext with knee flexed- tactile cuing. Bridge + march   PATIENT  EDUCATION:  Education details: Teacher, music of condition, POC, HEP, exercise form/rationale  Person educated: Patient Education method: Explanation, Demonstration, Tactile cues, Verbal cues, and Handouts Education comprehension: verbalized understanding, returned demonstration, verbal cues required, tactile cues required, and needs further education  HOME EXERCISE PROGRAM: [id number]   ASSESSMENT:  CLINICAL IMPRESSION: Notable lack of anterior chain control of forward progression of right hip creating strain on pectineus and compression into posterior hip.  SLS on Rt leg demonstrated poor control of distal femur IR. Focused on core engagement with neutral posture in standing- PT providing tactile cues to pull Rt hip back and pt reported feeling  fatigue in lateral hip. Added ankle stability exercises for full chain control.     PERSONAL FACTORS: see pertinent history are also affecting patient's functional outcome.   REHAB POTENTIAL: Good  CLINICAL DECISION MAKING: Evolving/moderate complexity  EVALUATION COMPLEXITY: Moderate   GOALS: Goals reviewed with patient? Yes  SHORT TERM GOALS: Target date: 9/6  Marching bridge and maintaining a level pelvis Baseline: Goal status: INITIAL  2.  Maintain core contraction in quadruped position with extremity movements.  Baseline:  Goal status: INITIAL    LONG TERM GOALS: Target date: POC date  LE strength 90% of opposite LE via dynamometry or tindeq testing Baseline:  Goal status: INITIAL  2.  Single leg stability in dynamic balance Baseline:  Goal status: INITIAL  3.  Progressing into plyometric motions with good form Baseline:  Goal status: INITIAL  4.  Prepared for progression of weight lifting without increase in pain Baseline:  Goal status: INITIAL     PLAN:  PT FREQUENCY: 1-2x/week  PT DURATION: POC Date  PLANNED INTERVENTIONS: 97164- PT Re-evaluation, 97750- Physical Performance Testing, 97110-Therapeutic exercises, 97530- Therapeutic activity, [id number]- Neuromuscular re-education, 97535- Self Care, 02859- Manual therapy, [phone]- Aquatic Therapy, [phone] (1-2 muscles), [phone] (3+ muscles)- Dry Needling, Patient/Family education, Balance training, Stair training, Taping, Joint mobilization, Spinal mobilization, and Cryotherapy.  PLAN FOR NEXT SESSION: continue with muscle contraction coordination   Harlene Cordon, PT, DPT 04/06/2024, 3:53 PM

## 2024-04-08 ENCOUNTER — Encounter: Payer: Self-pay | Admitting: Dermatology

## 2024-04-08 ENCOUNTER — Other Ambulatory Visit: Payer: Self-pay

## 2024-04-08 DIAGNOSIS — L700 Acne vulgaris: Secondary | ICD-10-CM

## 2024-04-08 MED ORDER — PREDNISONE 10 MG PO TABS
ORAL_TABLET | ORAL | 0 refills | Status: AC
Start: 2024-04-08 — End: 2024-04-22

## 2024-04-08 NOTE — Progress Notes (Signed)
 Patient prescribed Prednisone  10 mg to titrate down for 2 weeks to help with Acne break out.

## 2024-04-08 NOTE — Telephone Encounter (Signed)
 Yes, please send an rx of prednisdone 10mg ,  she will take 2 tablets qam for 1 week and then 1 tbalet am for 1 week.  -Dr Alm

## 2024-04-10 ENCOUNTER — Ambulatory Visit (HOSPITAL_BASED_OUTPATIENT_CLINIC_OR_DEPARTMENT_OTHER): Admitting: Physical Therapy

## 2024-04-10 ENCOUNTER — Encounter (HOSPITAL_BASED_OUTPATIENT_CLINIC_OR_DEPARTMENT_OTHER): Payer: Self-pay | Admitting: Physical Therapy

## 2024-04-10 DIAGNOSIS — M6281 Muscle weakness (generalized): Secondary | ICD-10-CM

## 2024-04-10 DIAGNOSIS — M25551 Pain in right hip: Secondary | ICD-10-CM | POA: Diagnosis not present

## 2024-04-10 NOTE — Therapy (Signed)
 OUTPATIENT PHYSICAL THERAPY EVALUATION   Patient Name: Marie Hanson MRN: [id number] DOB:Jan 12, 1981, 43 y.o., female Today's Date: 04/10/2024  END OF SESSION:  PT End of Session - 04/10/24 0836     Visit Number 3    Number of Visits 16    Date for PT Re-Evaluation 05/23/24    Authorization Type [employer]    PT Start Time [phone]    PT Stop Time 0915    PT Time Calculation (min) 39 min    Activity Tolerance Patient tolerated treatment well    Behavior During Therapy WFL for tasks assessed/performed           Past Medical History:  Diagnosis Date   Allergy     Hay fever, pollen, mold etc.   Anal fissure    Anxiety    Cardiac arrhythmia    during chidlhood   Depression    Eczema    Nipple pain x's 2 months   Rosacea    Urticaria    Past Surgical History:  Procedure Laterality Date   AUGMENTATION MAMMAPLASTY Bilateral    breast enhancement & removal   BREAST IMPLANT REMOVAL Bilateral    CYST REMOVAL HAND Left 2025   ganglion cyst   REFRACTIVE SURGERY Bilateral    Patient Active Problem List   Diagnosis Date Noted   Allergic rhinitis, unspecified 01/22/2024   Chronic right hip pain 01/07/2024   Anogenital HSV infection 08/13/2023   Hot flashes 07/26/2023   Vitamin B12 deficiency 06/20/2023   Flushing 06/03/2023   Chronic urticaria 03/18/2023   Acne vulgaris 03/18/2023   Seasonal and perennial allergic rhinitis 10/24/2022   Allergic conjunctivitis of both eyes 10/24/2022   Nickel allergy  10/24/2022   Raynaud's phenomenon 06/18/2022   Rosacea 03/05/2019   Anxiety 03/05/2019      REFERRING PROVIDER:  Genelle Standing, MD         REFERRING DIAG:  [phone] (ICD-10-CM) - Chronic right hip pain  S73.191D (ICD-10-CM) - Tear of right acetabular labrum, subsequent encounter  M54.10 (ICD-10-CM) - Radicular pain of right lower extremity  Q79.60 (ICD-10-CM) - Ehlers-Danlos syndrome    Rationale for Evaluation and Treatment:  Rehabilitation  THERAPY DIAG:  Pain in right hip  Muscle weakness (generalized)  ONSET DATE: 7-8 years ago   SUBJECTIVE:                                                                                                                                                                                           SUBJECTIVE STATEMENT: I like the ankle exercises, no change in sensation.   EVAL: Maybe 7-8 years ago, Rt groin has been very  tight. Had COVID and ended up with odd symptoms. After I had it again it seemed like all of my joints hurt and inflammation overall is up. Did have some odd findings at rheumatologist and f/u in Oct. I have a lot of cracking. If I can get a crack on the front of my ankle I feel lke the blood flows into the foot. Denies long term results from injection into hip or L4/5. Rt leg feels very weak and Rt doesn't seem to engage.  I used to do a lot of cardio, ballet, heavy weight lifting. Backed down to [employer], pilates.  Anterior pelvic tilt and weak lower core.  Rt ankle lateral rolling due to great toe numbness.  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 No Occasional pinching in Right leg The weakness and numbness bothers me most  PRECAUTIONS:  None  RED FLAGS: None   WEIGHT BEARING RESTRICTIONS:  No  FALLS:  Has patient fallen in last 6 months? No   OCCUPATION:  N/A  PLOF:  Independent  PATIENT GOALS:  Back to workouts   OBJECTIVE:  Note: Objective measures were completed at Evaluation unless otherwise noted.  PATIENT SURVEYS:  Not completed at eval  COGNITIVE STATUS: Within functional limits for tasks assessed   SENSATION: Eval: lacking proprioception for muscle contraction  POSTURE:  EVAL: pt sits with upright posture and decreased spinal curvature, anterior pelvic  tilt  GAIT: Comments: WFL   Body Part #1 Hip   LOWER EXTREMITY MMT:    MMT Right eval Left eval  Hip flexion 26.9 33.1  Hip extension- qped knee ext, behind knee 18.0 31.0  Hip abduction- SL at knee 31.0 53.7  Hip adduction    Hip internal rotation    Hip external rotation    Knee flexion 17.2 32.0  Knee extension 44.2 57.2  Ankle dorsiflexion    Ankle plantarflexion    Ankle inversion    Ankle eversion     (Blank rows = not tested)                                                                                                                               TREATMENT DATE:   8/29 Lateral ste up with knee drive 4, + airex pad; added pause/balance at the top Shuttle sidelying press & jumps #25 Single leg hinge- back leg extended & with back leg turned out Bosu balance, with hip hinge, hip hinge + pause  8/25 STM Rt pectineus, checked lateral hip with no TTP noted Hip hike 2 step Knee drive from lunge & qped resisted by red tband Eversion yellow tband knees at 90 and feet not touching floor Long sitting inversion squeeze on ball Heel raises with ball bw ankles Supine double leg lower with yellow tband around  feet Supine TT to press out hollow body yellow tband around feet  03/23/24 EVAL Supine core progression- march, extension, SLR SL hip abd, circles with core engagement Prone hip ext with knee flexed- tactile cuing. Bridge + march   PATIENT EDUCATION:  Education details: Teacher, music of condition, POC, HEP, exercise form/rationale  Person educated: Patient Education method: Explanation, Demonstration, Tactile cues, Verbal cues, and Handouts Education comprehension: verbalized understanding, returned demonstration, verbal cues required, tactile cues required, and needs further education  HOME EXERCISE PROGRAM: [id number]   ASSESSMENT:  CLINICAL IMPRESSION: Main focus on CKC stability through pelvis and ankles today with various position changes. BOSU used  when fatigued to encourage proprioception of equal WB.     PERSONAL FACTORS: see pertinent history are also affecting patient's functional outcome.   REHAB POTENTIAL: Good  CLINICAL DECISION MAKING: Evolving/moderate complexity  EVALUATION COMPLEXITY: Moderate   GOALS: Goals reviewed with patient? Yes  SHORT TERM GOALS: Target date: 9/6  Marching bridge and maintaining a level pelvis Baseline: Goal status: INITIAL  2.  Maintain core contraction in quadruped position with extremity movements.  Baseline:  Goal status: INITIAL    LONG TERM GOALS: Target date: POC date  LE strength 90% of opposite LE via dynamometry or tindeq testing Baseline:  Goal status: INITIAL  2.  Single leg stability in dynamic balance Baseline:  Goal status: INITIAL  3.  Progressing into plyometric motions with good form Baseline:  Goal status: INITIAL  4.  Prepared for progression of weight lifting without increase in pain Baseline:  Goal status: INITIAL     PLAN:  PT FREQUENCY: 1-2x/week  PT DURATION: POC Date  PLANNED INTERVENTIONS: 97164- PT Re-evaluation, 97750- Physical Performance Testing, 97110-Therapeutic exercises, 97530- Therapeutic activity, [id number]- Neuromuscular re-education, 97535- Self Care, 02859- Manual therapy, [phone]- Aquatic Therapy, [phone] (1-2 muscles), 20561 (3+ muscles)- Dry Needling, Patient/Family education, Balance training, Stair training, Taping, Joint mobilization, Spinal mobilization, and Cryotherapy.  PLAN FOR NEXT SESSION: continue with muscle contraction coordination   Harlene Cordon, PT, DPT 04/10/2024, 9:20 AM

## 2024-04-14 ENCOUNTER — Encounter: Payer: Self-pay | Admitting: Sports Medicine

## 2024-04-18 ENCOUNTER — Encounter (HOSPITAL_BASED_OUTPATIENT_CLINIC_OR_DEPARTMENT_OTHER): Payer: Self-pay

## 2024-04-18 ENCOUNTER — Ambulatory Visit (HOSPITAL_BASED_OUTPATIENT_CLINIC_OR_DEPARTMENT_OTHER): Attending: Orthopaedic Surgery | Admitting: Physical Therapy

## 2024-04-21 ENCOUNTER — Ambulatory Visit: Admitting: Dermatology

## 2024-04-21 ENCOUNTER — Encounter: Payer: Self-pay | Admitting: Dermatology

## 2024-04-21 VITALS — BP 108/71 | HR 75 | Wt 115.0 lb

## 2024-04-21 DIAGNOSIS — H04123 Dry eye syndrome of bilateral lacrimal glands: Secondary | ICD-10-CM | POA: Diagnosis not present

## 2024-04-21 DIAGNOSIS — L853 Xerosis cutis: Secondary | ICD-10-CM

## 2024-04-21 DIAGNOSIS — Z7189 Other specified counseling: Secondary | ICD-10-CM

## 2024-04-21 DIAGNOSIS — T50995A Adverse effect of other drugs, medicaments and biological substances, initial encounter: Secondary | ICD-10-CM

## 2024-04-21 DIAGNOSIS — L700 Acne vulgaris: Secondary | ICD-10-CM | POA: Diagnosis not present

## 2024-04-21 DIAGNOSIS — Z79899 Other long term (current) drug therapy: Secondary | ICD-10-CM

## 2024-04-21 MED ORDER — ISOTRETINOIN 10 MG PO CAPS: 20.0000 mg | ORAL_CAPSULE | Freq: Every day | ORAL | 0 refills | Status: DC

## 2024-04-21 NOTE — Progress Notes (Signed)
 Isotretinoin  Follow-Up Visit   Subjective  Marie Hanson is a 43 y.o. female who presents for the following: Isotretinoin  follow-up  Week # 5, Current dosage 10 mg QD   Isotretinoin  F/U - 04/21/24 1000       Isotretinoin  Follow Up   iPledge # [id number]    Date 04/21/24    Weight 115 lb (52.2 kg)    Two Forms of Birth Control Female Condom;Oral Contraceptives (w/ estrogen)    Acne breakouts since last visit? Yes      Dosage   Target Dosage (mg) 7500    Current (To Date) Dosage (mg) 350    To Go Dosage (mg) 7150      Skin Side Effects   Dry Lips No    Nose bleeds No    Dry eyes Yes    Dry Skin No    Sunburn No      Gastrointestinal Side Effects   Nausea No    Diarrhea No    Blood in stool No      Neurological Side Effects   Blurred vision No    Depression No    Headache No    Homicidal thoughts No    Mood Changes No    Suicidal thoughts No      Constitutional Side Effects   Fatigue Yes      Musculoskeletal Side Effects   Muscle aches Yes      Other Side Effects   Other Side Effects Skin Sensitivity   Started about 1 week ago (September 1,2025)     Labs Notes   Last labs done 04/21/24    Lab comment Negative   Urine Pregnancy        Side effects: Dry skin, dry lips  Patient is not pregnant, not seeking pregnancy, and not breastfeeding.  The following portions of the chart were reviewed this encounter and updated as appropriate: medications, allergies, medical history  Review of Systems:  No other skin or systemic complaints except as noted in HPI or Assessment and Plan.  Objective  Well appearing patient in no apparent distress; mood and affect are within normal limits.  An examination of the face, neck, chest, and back was performed and relevant findings are noted below.            Assessment & Plan   ACNE VULGARIS Patient is currently on Isotretinoin  requiring FDA mandated monthly evaluations and laboratory  monitoring. Condition is currently not to goal (must reach target dose based on weight and also have clear skin for 2 months prior to discontinuation in order to help prevent relapse)  Exam findings: Dry eyes and Skin Sensitivity  Week # 5 Pharmacy: [facility] # [id number] Total mg -  350 mg Total mg/kg - 7mg /kg Birth Control- Oral Birth Control and Condoms  Start isotretinoin  20 mg (10 mg BID)  Urine pregnancy test performed in office today and was negative.  Patient demonstrates comprehension and confirms she will not get pregnant.   Patient confirmed in iPledge and isotretinoin  sent to pharmacy.    1. Acne Vulgaris with Isotretinoin  Treatment - Assessment: Patient currently on isotretinoin  therapy with cumulative dose of 7 mg at 10 mg daily. Experienced initial purging effect managed with prednisone  course. Tolerating current dose with expected dryness side effects. No visible retinoid dermatitis present. - Plan:    Increase isotretinoin  dose to 20 mg daily (two 10 mg tablets)    Take one tablet morning and one at night, or both  at night if preferred    Continue at 20 mg if tolerated; return to 10 mg if side effects become intolerable    Confirm counseling on iPledge system before pharmacy can dispense medication    Accurate tracking of days on 20 mg dose for cumulative dose calculation  2. Ocular Dryness with Isotretinoin -Related Exacerbation - Assessment: History of dry eyes predating isotretinoin  therapy. Recently had tear duct plugs reinserted by ophthalmologist. Left eye particularly affected. Ocular dryness is known side effect of isotretinoin  requiring close monitoring. - Plan:    Continue use of tear duct plugs as prescribed by ophthalmologist    Monitor ocular dryness with isotretinoin  dose increase    Return to 10 mg isotretinoin  dose if ocular dryness becomes intolerable at 20 mg  3. Cutaneous Dryness and Sensitivity Secondary to Isotretinoin  - Assessment:  Generalized skin dryness from isotretinoin  therapy. Localized skin sensitivity on left side persisting one week. No visible rash present. Sensitivity likely due to excessive dryness and barrier compromise. - Plan:    Continue Avene products for moisturizing with Tolerance in morning and Ciclafate at night    For sensitive area on left side apply mixture of Ciclafate, Tolerance, and hydrocortisone for 1-2 weeks, then discontinue hydrocortisone    Renew prescription for azelaic acid  15% for spot treatment    Use humidifier especially during sleep to increase ambient humidity  Follow-up in one month to assess isotretinoin  dose tolerance and monitor side effects.              Isotretinoin  Counseling; Review and Contraception Counseling: Reviewed potential side effects of isotretinoin  including xerosis, cheilitis, hepatitis, hyperlipidemia, and severe birth defects if taken by a pregnant woman.  Women on isotretinoin  must be celibate (not having sex) or required to use at least 2 birth control methods to prevent pregnancy (unless patient is a female of non-child bearing potential).  Females of child-bearing potential must have monthly pregnancy tests while on isotretinoin  and report through I-Pledge (FDA monitoring program). Reviewed reports of suicidal ideation in those with a history of depression while taking isotretinoin  and reports of diagnosis of inflammatory bowl disease (IBD) while taking isotretinoin  as well as the lack of evidence for a causal relationship between isotretinoin , depression and IBD. Patient advised to reach out with any questions or concerns. Patient advised not to share pills or donate blood while on treatment or for one month after completing treatment. All patient's considering Isotretinoin  must read and understand and sign Isotretinoin  Consent Form and be registered with I-Pledge.  Long term medication management (isotretinoin )  Patient is using long term (months to  years) prescription medication  to control their dermatologic condition.  These medications require periodic monitoring to evaluate for efficacy and side effects and may require periodic laboratory monitoring.  - While taking Isotretinoin  and for 30 days after you finish the medication, do not get pregnant, do not share pills, do not donate blood. Isotretinoin  is best absorbed when taken with a fatty meal. Isotretinoin  can make you sensitive to the sun. Daily careful sun protection including sunscreen SPF 30+ when outdoors is recommended.  ACNE VULGARIS   Related Medications YASMIN  28 3-0.03 MG tablet Take 1 tablet by mouth daily. predniSONE  (DELTASONE ) 10 MG tablet Take 2 tablets (20 mg total) by mouth daily with breakfast for 7 days, THEN 1 tablet (10 mg total) daily with breakfast for 7 days. isotretinoin  (ACCUTANE ) 10 MG capsule Take 2 capsules (20 mg total) by mouth daily. [facility] REMS ID: [id number]  Follow-up in  30 days.  I, Jetta Ager, am acting as Neurosurgeon for [employer], DO.  Documentation: I have reviewed the above documentation for accuracy and completeness, and I agree with the above.  Delon Lenis, DO

## 2024-04-21 NOTE — Patient Instructions (Signed)

## 2024-04-22 ENCOUNTER — Encounter: Payer: Self-pay | Admitting: Dermatology

## 2024-04-22 ENCOUNTER — Other Ambulatory Visit: Payer: Self-pay

## 2024-04-22 DIAGNOSIS — L700 Acne vulgaris: Secondary | ICD-10-CM

## 2024-04-22 MED ORDER — AZELAIC ACID 15 % EX GEL: 1.0000 | Freq: Two times a day (BID) | CUTANEOUS | 9 refills | Status: AC

## 2024-04-23 ENCOUNTER — Encounter (HOSPITAL_BASED_OUTPATIENT_CLINIC_OR_DEPARTMENT_OTHER): Admitting: Physical Therapy

## 2024-04-25 ENCOUNTER — Encounter (HOSPITAL_BASED_OUTPATIENT_CLINIC_OR_DEPARTMENT_OTHER): Admitting: Physical Therapy

## 2024-04-27 ENCOUNTER — Ambulatory Visit: Admitting: Podiatry

## 2024-04-29 ENCOUNTER — Ambulatory Visit

## 2024-04-29 NOTE — Progress Notes (Signed)
 Orthotics cannot be found sent back from [facility] on 8/12 delivered 8/13 ordering 2 pair for patient with MT pads to replace  Patient can pick up when in  No charge

## 2024-04-30 ENCOUNTER — Ambulatory Visit (HOSPITAL_BASED_OUTPATIENT_CLINIC_OR_DEPARTMENT_OTHER): Attending: Orthopaedic Surgery | Admitting: Physical Therapy

## 2024-04-30 ENCOUNTER — Encounter (HOSPITAL_BASED_OUTPATIENT_CLINIC_OR_DEPARTMENT_OTHER): Payer: Self-pay | Admitting: Physical Therapy

## 2024-04-30 DIAGNOSIS — M6281 Muscle weakness (generalized): Secondary | ICD-10-CM | POA: Diagnosis present

## 2024-04-30 DIAGNOSIS — M25551 Pain in right hip: Secondary | ICD-10-CM | POA: Insufficient documentation

## 2024-04-30 NOTE — Therapy (Signed)
 OUTPATIENT PHYSICAL THERAPY EVALUATION   Patient Name: Marie Hanson MRN: [id number] DOB:1981-02-11, 43 y.o., female Today's Date: 04/30/2024  END OF SESSION:  PT End of Session - 04/30/24 1346     Visit Number 4    Number of Visits 16    Date for Recertification  05/23/24    Authorization Type [employer]    PT Start Time 1345    PT Stop Time 1425 (P)     PT Time Calculation (min) 40 min (P)     Activity Tolerance Patient tolerated treatment well    Behavior During Therapy WFL for tasks assessed/performed            Past Medical History:  Diagnosis Date   Allergy     Hay fever, pollen, mold etc.   Anal fissure    Anxiety    Cardiac arrhythmia    during chidlhood   Depression    Eczema    Nipple pain x's 2 months   Rosacea    Urticaria    Past Surgical History:  Procedure Laterality Date   AUGMENTATION MAMMAPLASTY Bilateral    breast enhancement & removal   BREAST IMPLANT REMOVAL Bilateral    CYST REMOVAL HAND Left 2025   ganglion cyst   REFRACTIVE SURGERY Bilateral    Patient Active Problem List   Diagnosis Date Noted   Allergic rhinitis, unspecified 01/22/2024   Chronic right hip pain 01/07/2024   Anogenital HSV infection 08/13/2023   Hot flashes 07/26/2023   Vitamin B12 deficiency 06/20/2023   Flushing 06/03/2023   Chronic urticaria 03/18/2023   Acne vulgaris 03/18/2023   Seasonal and perennial allergic rhinitis 10/24/2022   Allergic conjunctivitis of both eyes 10/24/2022   Nickel allergy  10/24/2022   Raynaud's phenomenon 06/18/2022   Rosacea 03/05/2019   Anxiety 03/05/2019      REFERRING PROVIDER:  Genelle Standing, MD         REFERRING DIAG:  [phone] (ICD-10-CM) - Chronic right hip pain  S73.191D (ICD-10-CM) - Tear of right acetabular labrum, subsequent encounter  M54.10 (ICD-10-CM) - Radicular pain of right lower extremity  Q79.60 (ICD-10-CM) - Ehlers-Danlos syndrome    Rationale for Evaluation and Treatment:  Rehabilitation  THERAPY DIAG:  Pain in right hip  Muscle weakness (generalized)  ONSET DATE: 7-8 years ago   SUBJECTIVE:                                                                                                                                                                                           SUBJECTIVE STATEMENT: I started acutane and muscle aches has increased significantly.   EVAL: Maybe 7-8 years ago,  Rt groin has been very tight. Had COVID and ended up with odd symptoms. After I had it again it seemed like all of my joints hurt and inflammation overall is up. Did have some odd findings at rheumatologist and f/u in Oct. I have a lot of cracking. If I can get a crack on the front of my ankle I feel lke the blood flows into the foot. Denies long term results from injection into hip or L4/5. Rt leg feels very weak and Rt doesn't seem to engage.  I used to do a lot of cardio, ballet, heavy weight lifting. Backed down to [employer], pilates.  Anterior pelvic tilt and weak lower core.  Rt ankle lateral rolling due to great toe numbness.  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 No Occasional pinching in Right leg The weakness and numbness bothers me most  PRECAUTIONS:  None  RED FLAGS: None   WEIGHT BEARING RESTRICTIONS:  No  FALLS:  Has patient fallen in last 6 months? No   OCCUPATION:  N/A  PLOF:  Independent  PATIENT GOALS:  Back to workouts   OBJECTIVE:  Note: Objective measures were completed at Evaluation unless otherwise noted.  PATIENT SURVEYS:  Not completed at eval  COGNITIVE STATUS: Within functional limits for tasks assessed   SENSATION: Eval: lacking proprioception for muscle contraction  POSTURE:  EVAL: pt sits with upright posture and decreased spinal curvature, anterior  pelvic tilt  GAIT: Comments: WFL   Body Part #1 Hip   LOWER EXTREMITY MMT:    MMT Right eval Left eval  Hip flexion 26.9 33.1  Hip extension- qped knee ext, behind knee 18.0 31.0  Hip abduction- SL at knee 31.0 53.7  Hip adduction    Hip internal rotation    Hip external rotation    Knee flexion 17.2 32.0  Knee extension 44.2 57.2  Ankle dorsiflexion    Ankle plantarflexion    Ankle inversion    Ankle eversion     (Blank rows = not tested)                                                                                                                               TREATMENT DATE:   8/29 Lateral ste up with knee drive 4, + airex pad; added pause/balance at the top Shuttle sidelying press & jumps #25 Single leg hinge- back leg extended & with back leg turned out Bosu balance, with hip hinge, hip hinge + pause  8/25 STM Rt pectineus, checked lateral hip with no TTP noted Hip hike 2 step Knee drive from lunge & qped resisted by red tband Eversion yellow tband knees at 90 and feet not touching floor Long sitting inversion squeeze on ball Heel raises with ball bw ankles Supine double leg  lower with yellow tband around feet Supine TT to press out hollow body yellow tband around feet  03/23/24 EVAL Supine core progression- march, extension, SLR SL hip abd, circles with core engagement Prone hip ext with knee flexed- tactile cuing. Bridge + march   PATIENT EDUCATION:  Education details: Teacher, music of condition, POC, HEP, exercise form/rationale  Person educated: Patient Education method: Explanation, Demonstration, Tactile cues, Verbal cues, and Handouts Education comprehension: verbalized understanding, returned demonstration, verbal cues required, tactile cues required, and needs further education  HOME EXERCISE PROGRAM: [id number]   ASSESSMENT:  CLINICAL IMPRESSION: Use off pilates reformer for full-body engagement and cues for biomechanical chain  activation. Pt was successful in feeling activation of lumbopelvic musculature that has previously been difficult to feel. Pt will be d/c at this time as PT has been unable to alter sensation changes that she was referred for. However, she does have an extensive HEP for gross chain stability and proprioception to manage hypermobility and spasm. Discussed reformer classes available at [facility] as well as at [employer]. Encouraged her to reach out with any further questions.     PERSONAL FACTORS: see pertinent history are also affecting patient's functional outcome.   REHAB POTENTIAL: Good  CLINICAL DECISION MAKING: Evolving/moderate complexity  EVALUATION COMPLEXITY: Moderate   GOALS: Goals reviewed with patient? Yes  SHORT TERM GOALS: Target date: 9/6  Marching bridge and maintaining a level pelvis Baseline: Goal status: MET  2.  Maintain core contraction in quadruped position with extremity movements.  Baseline:  Goal status: MET    LONG TERM GOALS: Target date: POC date  LE strength 90% of opposite LE via dynamometry or tindeq testing Baseline:  Goal status: not tested today  2.  Single leg stability in dynamic balance Baseline:  Goal status: MET  3.  Progressing into plyometric motions with good form Baseline:  Goal status: ongoing- pt did well but requires further training  4.  Prepared for progression of weight lifting without increase in pain Baseline:  Goal status: ongoing- will continue to work on this.      PLAN:  PT FREQUENCY: 1-2x/week  PT DURATION: POC Date  PLANNED INTERVENTIONS: 97164- PT Re-evaluation, 97750- Physical Performance Testing, 97110-Therapeutic exercises, 97530- Therapeutic activity, [id number]- Neuromuscular re-education, 97535- Self Care, 02859- Manual therapy, [phone]- Aquatic Therapy, [phone] (1-2 muscles), 20561 (3+ muscles)- Dry Needling, Patient/Family education, Balance training, Stair training, Taping, Joint mobilization, Spinal  mobilization, and Cryotherapy.  PLAN FOR NEXT SESSION: continue with muscle contraction coordination   Harlene Cordon, PT, DPT 04/30/2024, 2:37 PM

## 2024-05-19 ENCOUNTER — Ambulatory Visit: Admitting: Dermatology

## 2024-05-19 ENCOUNTER — Encounter: Payer: Self-pay | Admitting: Dermatology

## 2024-05-19 VITALS — BP 113/74 | HR 72 | Wt 111.0 lb

## 2024-05-19 DIAGNOSIS — Z79899 Other long term (current) drug therapy: Secondary | ICD-10-CM

## 2024-05-19 DIAGNOSIS — L700 Acne vulgaris: Secondary | ICD-10-CM

## 2024-05-19 DIAGNOSIS — H04123 Dry eye syndrome of bilateral lacrimal glands: Secondary | ICD-10-CM

## 2024-05-19 DIAGNOSIS — L853 Xerosis cutis: Secondary | ICD-10-CM

## 2024-05-19 MED ORDER — ISOTRETINOIN 10 MG PO CAPS: 10.0000 mg | ORAL_CAPSULE | Freq: Two times a day (BID) | ORAL | 0 refills | Status: DC

## 2024-05-19 MED ORDER — ISOTRETINOIN 20 MG PO CAPS: 20.0000 mg | ORAL_CAPSULE | Freq: Two times a day (BID) | ORAL | 0 refills | Status: DC

## 2024-05-19 NOTE — Patient Instructions (Signed)

## 2024-05-19 NOTE — Progress Notes (Signed)
 Isotretinoin  Follow-Up Visit   Subjective  Marie Hanson is a 43 y.o. female who presents for the following: Isotretinoin  follow-up  Week # 9, Currently taking 20 mg Daily (Taking 2 10mg  Started on 04/26/24)   Isotretinoin  F/U - 05/19/24 1300       Isotretinoin  Follow Up   iPledge # [id number]    Date 05/19/24    Weight 111 lb (50.3 kg)    Two Forms of Birth Control Oral Contraceptives (w/ estrogen);Female Vasectomy    Acne breakouts since last visit? Yes      Dosage   Target Dosage (mg) 7500    Current (To Date) Dosage (mg) 530    To Go Dosage (mg) 6970      Skin Side Effects   Dry Lips No    Nose bleeds No    Dry eyes Yes    Dry Skin No    Sunburn No      Gastrointestinal Side Effects   Nausea No    Diarrhea No    Blood in stool No      Neurological Side Effects   Blurred vision No    Depression No    Headache No    Homicidal thoughts No    Mood Changes No    Suicidal thoughts No      Constitutional Side Effects   Fatigue No      Musculoskeletal Side Effects   Muscle aches Yes      Other Side Effects   Other Side Effects Hair Shedding      Labs Notes   Last labs done 05/19/24         Side effects: Dry skin, dry lips  Patient is not pregnant, not seeking pregnancy, and not breastfeeding.   The following portions of the chart were reviewed this encounter and updated as appropriate: medications, allergies, medical history  Review of Systems:  No other skin or systemic complaints except as noted in HPI or Assessment and Plan.  Objective  Well appearing patient in no apparent distress; mood and affect are within normal limits.  An examination of the face, neck, chest, and back was performed and relevant findings are noted below.            Assessment & Plan   ACNE VULGARIS Patient is currently on Isotretinoin  requiring FDA mandated monthly evaluations and laboratory monitoring. Condition is currently not to goal (must reach target  dose based on weight and also have clear skin for 2 months prior to discontinuation in order to help prevent relapse)  Exam findings: Hair Shedding and Dry Eyes  Week # 9 Pharmacy: [facility]: # [id number] Total mg -  530 Total mg/kg - 11 mg/kg Birth Control- Vasectomy and Oral Birth Control  Continue isotretinoin  20 mg Daily  Urine pregnancy test performed in office today and was negative.  Patient demonstrates comprehension and confirms she will not get pregnant.   Patient confirmed in iPledge and isotretinoin  sent to pharmacy.  Isotretinoin  Counseling; Review and Contraception Counseling: Reviewed potential side effects of isotretinoin  including xerosis, cheilitis, hepatitis, hyperlipidemia, and severe birth defects if taken by a pregnant woman.  Women on isotretinoin  must be celibate (not having sex) or required to use at least 2 birth control methods to prevent pregnancy (unless patient is a female of non-child bearing potential).  Females of child-bearing potential must have monthly pregnancy tests while on isotretinoin  and report through I-Pledge (FDA monitoring program). Reviewed reports of suicidal ideation in those with  a history of depression while taking isotretinoin  and reports of diagnosis of inflammatory bowl disease (IBD) while taking isotretinoin  as well as the lack of evidence for a causal relationship between isotretinoin , depression and IBD. Patient advised to reach out with any questions or concerns. Patient advised not to share pills or donate blood while on treatment or for one month after completing treatment. All patient's considering Isotretinoin  must read and understand and sign Isotretinoin  Consent Form and be registered with I-Pledge.  Long term medication management (isotretinoin )  Patient is using long term (months to years) prescription medication  to control their dermatologic condition.  These medications require periodic monitoring to evaluate for  efficacy and side effects and may require periodic laboratory monitoring.  - While taking Isotretinoin  and for 30 days after you finish the medication, do not get pregnant, do not share pills, do not donate blood. Isotretinoin  is best absorbed when taken with a fatty meal. Isotretinoin  can make you sensitive to the sun. Daily careful sun protection including sunscreen SPF 30+ when outdoors is recommended.  ACNE VULGARIS   Related Medications YASMIN  28 3-0.03 MG tablet Take 1 tablet by mouth daily. Azelaic Acid  15 % gel Apply 1 Application topically 2 (two) times daily. After skin is thoroughly washed and patted dry, gently but thoroughly massage a thin film of azelaic acid  cream into the affected area twice daily, in the morning and evening. isotretinoin  (ACCUTANE ) 10 MG capsule Take 1 capsule (10 mg total) by mouth 2 (two) times daily. HIGH RISK MEDICATION USE   Related Medications isotretinoin  (ACCUTANE ) 10 MG capsule Take 1 capsule (10 mg total) by mouth 2 (two) times daily.  Follow-up in 30 days.  I, Melinda Gwinner, am acting as Neurosurgeon for [employer], DO.  Documentation: I have reviewed the above documentation for accuracy and completeness, and I agree with the above.  Delon Lenis, DO

## 2024-05-20 ENCOUNTER — Encounter: Payer: Self-pay | Admitting: Dermatology

## 2024-05-21 ENCOUNTER — Other Ambulatory Visit: Payer: Self-pay | Admitting: Dermatology

## 2024-05-21 DIAGNOSIS — L700 Acne vulgaris: Secondary | ICD-10-CM

## 2024-05-21 DIAGNOSIS — Z79899 Other long term (current) drug therapy: Secondary | ICD-10-CM

## 2024-05-21 NOTE — Telephone Encounter (Signed)
 Pt called in to check on the status update of her prescription. I let pt know it is being worked on, pt would like a call back from the clinical team with an update, call back number: [phone].

## 2024-05-21 NOTE — Telephone Encounter (Signed)
 Hi Jetta,  We saw this pt together the other day.  Can you confirm with the pharmacy she's to take 2 10mg  tablets daily.  Thanks

## 2024-06-04 ENCOUNTER — Other Ambulatory Visit: Payer: Self-pay | Admitting: Internal Medicine

## 2024-06-09 ENCOUNTER — Other Ambulatory Visit: Payer: Self-pay | Admitting: *Deleted

## 2024-06-09 DIAGNOSIS — I7300 Raynaud's syndrome without gangrene: Secondary | ICD-10-CM

## 2024-06-09 DIAGNOSIS — R7689 Other specified abnormal immunological findings in serum: Secondary | ICD-10-CM

## 2024-06-09 DIAGNOSIS — M2550 Pain in unspecified joint: Secondary | ICD-10-CM

## 2024-06-09 NOTE — Progress Notes (Signed)
 Office Visit Note  Patient: Marie Hanson             Date of Birth: 10/03/1980           MRN: [id number]             PCP: Almarie Waddell NOVAK, NP Referring: Almarie Waddell NOVAK, NP Visit Date: 06/23/2024 Occupation: Data Unavailable  Subjective:  Pain in joints  History of Present Illness: Marie Hanson is a 43 y.o. 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  At that visit we discussed hydroxychloroquine but she decided to hold off.  She states her right wrist joint pain has been better however she crochet which aggravates the wrist sometimes.  She continues to have discomfort in her right hip, right knee and her right ankle.  She was evaluated by Dr. Burnetta for right lower extremity pain.  He advised her that her symptoms are due to hypermobility and mild form of her Ehlers-Danlos syndrome.  She tried 4 sessions of physical therapy and dry needling and did not notice any improvement.  She started Accutane  at the end of August for acne. She continues to have some facial redness.  She has not had much problems with Raynaud's phenomenon.  Her hands always stay cold.  She also gives history of extremely dry eyes.  She states despite using eyedrops she continues to have dry eye symptoms.  She is also has lacrimal duct plugging. Patient states that she was evaluated at [facility] and was diagnosed with systemic yeast infection.  She was given oral nystatin .   Activities of Daily Living:  Patient reports morning stiffness for all day. Patient Reports nocturnal pain.  Difficulty dressing/grooming: Denies Difficulty climbing stairs: Denies Difficulty getting out of chair: Denies Difficulty using hands for taps, buttons, cutlery, and/or writing: Denies  Review of Systems  Constitutional:  Positive for fatigue.  HENT:  Negative for mouth sores and mouth dryness.   Eyes:  Positive for dryness.  Respiratory:  Negative for  shortness of breath.   Cardiovascular:  Positive for palpitations. Negative for chest pain.  Gastrointestinal:  Positive for constipation. Negative for blood in stool and diarrhea.  Endocrine: Negative for increased urination.  Genitourinary:  Negative for involuntary urination.  Musculoskeletal:  Positive for joint pain, joint pain, myalgias, morning stiffness and myalgias. Negative for gait problem, joint swelling, muscle weakness and muscle tenderness.  Skin:  Negative for color change, rash, hair loss and sensitivity to sunlight.  Allergic/Immunologic: Negative for susceptible to infections.  Neurological:  Negative for dizziness and headaches.  Hematological:  Negative for swollen glands.  Psychiatric/Behavioral:  Positive for sleep disturbance. Negative for depressed mood. The patient is nervous/anxious.     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   Anal fissure    Anxiety    Cardiac arrhythmia  during chidlhood   Depression    Eczema    Ehlers-Danlos syndrome 2025   dx by ortho, Dr. Burnetta   Nipple pain x's 2 months   Rosacea    Urticaria     Family History  Problem Relation Age of Onset   Urticaria Mother    Eczema Mother    Allergic rhinitis Mother    Diabetes Mother    Colon polyps Mother    Irritable bowel syndrome Mother    Hyperlipidemia Mother    Osteoporosis Mother     Hypertension Mother    Depression Father    Anxiety disorder Father    BRCA 1/2 Neg Hx    Breast cancer Neg Hx    Colitis Neg Hx    Esophageal cancer Neg Hx    Pancreatic cancer Neg Hx    Stomach cancer Neg Hx    Past Surgical History:  Procedure Laterality Date   AUGMENTATION MAMMAPLASTY Bilateral    breast enhancement & removal   BREAST IMPLANT REMOVAL Bilateral    CYST REMOVAL HAND Left 2025   ganglion cyst   REFRACTIVE SURGERY Bilateral    Social History   Tobacco Use   Smoking status: Never    Passive exposure: Never   Smokeless tobacco: Never  Vaping Use   Vaping status: Never Used  Substance Use Topics   Alcohol use: Not Currently   Drug use: Never   Social History   Social History Narrative   Not on file     Immunization History  Administered Date(s) Administered   INFLUENZA, HIGH DOSE SEASONAL PF 03/31/2022   Influenza, Mdck, Trivalent,PF 6+ MOS(egg free) 05/04/2024   Influenza, Seasonal, Injecte, Preservative Fre 05/11/2023   Influenza,inj,Quad PF,6+ Mos 03/28/2019   Influenza-Unspecified 04/03/2019, 04/20/2020, 04/27/2021   Novavax(Covid-19) Vaccine 08/29/2023   PFIZER(Purple Top)SARS-COV-2 Vaccination 11/12/2019, 12/07/2019, 07/01/2020   Tdap 03/05/2019     Objective: Vital Signs: BP 121/86   Pulse 68   Temp 98 F (36.7 C)   Resp 13   Ht 5' 3 (1.6 m)   Wt 112 lb 9.6 oz (51.1 kg)   BMI 19.95 kg/m    Physical Exam Vitals and nursing note reviewed.  Constitutional:      Appearance: She is well-developed.  HENT:     Head: Normocephalic and atraumatic.  Eyes:     Conjunctiva/sclera: Conjunctivae normal.  Cardiovascular:     Rate and Rhythm: Normal rate and regular rhythm.     Heart sounds: Normal heart sounds.  Pulmonary:     Effort: Pulmonary effort is normal.     Breath sounds: Normal breath sounds.  Abdominal:     General: Bowel sounds are normal.     Palpations: Abdomen is soft.  Musculoskeletal:     Cervical back: Normal  range of motion.  Lymphadenopathy:     Cervical: No cervical adenopathy.  Skin:    General: Skin is warm and dry.     Capillary Refill: Capillary refill takes less than 2 seconds.  Neurological:     Mental Status: She is alert and oriented to person, place, and time.  Psychiatric:        Behavior: Behavior normal.      Musculoskeletal Exam: Cervical, thoracic and lumbar spine were in good range of motion.  There was no SI joint tenderness.  Shoulder joints, elbow joints, wrist joints, MCPs, PIPs and DIPs were in good range of motion with no synovitis.  Hip joints and knee joints were in good range  of motion without any warmth swelling or effusion.  There was no tenderness over ankles or MTPs.   CDAI Exam: CDAI Score: -- Patient Global: --; Provider Global: -- Swollen: --; Tender: -- Joint Exam 06/23/2024   No joint exam has been documented for this visit   There is currently no information documented on the homunculus. Go to the Rheumatology activity and complete the homunculus joint exam.  Investigation: No additional findings.  Imaging: No results found.  Recent Labs: Lab Results  Component Value Date   WBC 9.6 06/09/2024   HGB 13.9 06/09/2024   PLT 225 06/09/2024   NA 135 06/09/2024   K 4.0 06/09/2024   CL 102 06/09/2024   CO2 25 06/09/2024   GLUCOSE 90 06/09/2024   BUN 26 (H) 06/09/2024   CREATININE 0.86 06/09/2024   BILITOT 0.3 06/09/2024   ALKPHOS 54 10/20/2023   AST 14 06/09/2024   ALT 26 06/09/2024   PROT 6.8 06/09/2024   ALBUMIN 3.9 10/20/2023   CALCIUM 8.6 06/09/2024   June 09, 2024 ANA 1: 80 NS, 1: 80 homogeneous, Smith negative, SSA negative, SCL 70 negative, RNP negative, dsDNA negative, sed rate 2, C3-C4 normal, RF negative, urine protein creatinine ratio normal.  Speciality Comments: No specialty comments available.  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 03/12/23: ANA negative, RF-, Ro-, ESR and CRP WNL12/24/2025 ANA 1: 320 nuclear speckled,. - June 09, 2024 ANA 1: 80 NS, 1: 80 homogeneous, Smith negative, SSA negative, SCL 70 negative, RNP negative, dsDNA negative, sed rate 2, C3-C4 normal, RF negative, urine protein creatinine ratio normal.  Lab results were reviewed with the patient.  Her ANA titer has gone down.  However patient is concerned about the ongoing symptoms of fatigue, joint pain, Raynauds and dry eyes.  She states she thought about hydroxychloroquine and wants to give it a try.  Consent was obtained at the last visit.  Side effects were again reviewed.  She was advised to get a baseline eye examination and then annual eye examination.  I will send a prescription for hydroxychloroquine 200 mg p.o. daily based on her weight of 51 kg.  Plan: Protein / creatinine ratio, urine, CBC with Differential/Platelet, Comprehensive metabolic panel with GFR, ANA, Anti-DNA antibody, double-stranded, C3 and C4, Sedimentation rate, hydroxychloroquine (PLAQUENIL) 200 MG tablet  High risk medication use -patient was restarted on hydroxychloroquine 200 mg p.o. daily.  Side effects were discussed.  She will have labs in a month, 3 months and then every 5 months.  She was advised to baseline eye examination and then annual eye examination.  Information regarding vaccination was placed in the AVS.  Polyarthralgia-she states her right wrist joint pain has been better.  She continues to have discomfort in her right hip and right knee and also in right ankle.  She notices intermittent increased pain.  No synovitis was noted on the examination.  Patient would like to try hydroxychloroquine.  Facial rash-she continues to have generalized erythema of her face.  She has been diagnosed with rosacea.  She has been followed by Dr. Alm.  Other fatigue-she notices improvement in her  fatigue.  Raynaud's phenomenon without gangrene - she has Raynaud's phenomenon for many years which involves her hands and feet. her symptoms were exacerbated by recent COVID-19 infection in March 2025.  Currently not very symptomatic.  She  denies any digital or toe ulcers.  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  She was told by orthopedics that her hypermobility is contributing to her hip joint and knee joint discomfort.  No synovitis was noted.  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  Rosacea - Under care of Dr. Alm.  Seasonal and perennial allergic rhinitis - Inadequate response to Xolair .  Currently prescribed Singulair .  Anxiety -currently not taking any antianxiety medication.  Nickel allergy   Acne vulgaris -she is on spironolactone  and recently restarted Accutane .  Chronic urticaria  Orders: Orders Placed This Encounter  Procedures   Protein / creatinine ratio, urine   CBC with Differential/Platelet   Comprehensive metabolic panel with GFR   ANA   Anti-DNA antibody, double-stranded   C3 and C4   Sedimentation rate   Meds ordered this encounter  Medications   hydroxychloroquine (PLAQUENIL) 200 MG tablet    Sig: Take 1 tablet (200 mg total) by mouth daily.    Dispense:  30 tablet    Refill:  2     Follow-Up Instructions: Return in about 3 months (around 09/23/2024) for +ANA.   Maya Nash, MD  Note - This record has been created using Animal nutritionist.  Chart creation errors have been sought, but may not always  have been located. Such creation errors do not reflect on  the standard of medical care.

## 2024-06-11 ENCOUNTER — Ambulatory Visit: Payer: Self-pay | Admitting: Rheumatology

## 2024-06-11 NOTE — Progress Notes (Signed)
 All the labs are within normal limits, ANA is pending.  Will discuss results at the follow-up visit.

## 2024-06-12 LAB — PROTEIN / CREATININE RATIO, URINE
Creatinine, Urine: 25 mg/dL (ref 20–275)
Total Protein, Urine: <4 mg/dL — ABNORMAL LOW (ref 5–24)

## 2024-06-12 LAB — COMPREHENSIVE METABOLIC PANEL WITH GFR
AG Ratio: 1.6 (calc) (ref 1.0–2.5)
ALT: 26 U/L (ref 6–29)
AST: 14 U/L (ref 10–30)
Albumin: 4.2 g/dL (ref 3.6–5.1)
Alkaline phosphatase (APISO): 64 U/L (ref 31–125)
BUN/Creatinine Ratio: 30 (calc) — ABNORMAL HIGH (ref 6–22)
BUN: 26 mg/dL — ABNORMAL HIGH (ref 7–25)
CO2: 25 mmol/L (ref 20–32)
Calcium: 8.6 mg/dL (ref 8.6–10.2)
Chloride: 102 mmol/L (ref 98–110)
Creat: 0.86 mg/dL (ref 0.50–0.99)
Globulin: 2.6 g/dL (ref 1.9–3.7)
Glucose, Bld: 90 mg/dL (ref 65–99)
Potassium: 4 mmol/L (ref 3.5–5.3)
Sodium: 135 mmol/L (ref 135–146)
Total Bilirubin: 0.3 mg/dL (ref 0.2–1.2)
Total Protein: 6.8 g/dL (ref 6.1–8.1)
eGFR: 86 mL/min/1.73m2 (ref >=60)

## 2024-06-12 LAB — RHEUMATOID FACTOR: Rheumatoid fact SerPl-aCnc: 12 [IU]/mL (ref <14)

## 2024-06-12 LAB — CBC WITH DIFFERENTIAL/PLATELET
Absolute Lymphocytes: 2016 {cells}/uL (ref 850–3900)
Absolute Monocytes: 451 {cells}/uL (ref 200–950)
Basophils Absolute: 38 {cells}/uL (ref 0–200)
Basophils Relative: 0.4 %
Eosinophils Absolute: 58 {cells}/uL (ref 15–500)
Eosinophils Relative: 0.6 %
HCT: 43.5 % (ref 35.0–45.0)
Hemoglobin: 13.9 g/dL (ref 11.7–15.5)
MCH: 30.3 pg (ref 27.0–33.0)
MCHC: 32 g/dL (ref 32.0–36.0)
MCV: 94.8 fL (ref 80.0–100.0)
MPV: 11 fL (ref 7.5–12.5)
Monocytes Relative: 4.7 %
Neutro Abs: 7037 {cells}/uL (ref 1500–7800)
Neutrophils Relative %: 73.3 %
Platelets: 225 Thousand/uL (ref 140–400)
RBC: 4.59 Million/uL (ref 3.80–5.10)
RDW: 11.8 % (ref 11.0–15.0)
Total Lymphocyte: 21 %
WBC: 9.6 Thousand/uL (ref 3.8–10.8)

## 2024-06-12 LAB — ANTI-SCLERODERMA ANTIBODY: Scleroderma (Scl-70) (ENA) Antibody, IgG: <1 AI

## 2024-06-12 LAB — ANTI-NUCLEAR AB-TITER (ANA TITER)
ANA TITER: 1:80 {titer} — ABNORMAL HIGH
ANA Titer 1: 1:80 {titer} — ABNORMAL HIGH

## 2024-06-12 LAB — SJOGRENS SYNDROME-A EXTRACTABLE NUCLEAR ANTIBODY: SSA (Ro) (ENA) Antibody, IgG: <1 AI

## 2024-06-12 LAB — RNP ANTIBODY: Ribonucleic Protein(ENA) Antibody, IgG: <1 AI

## 2024-06-12 LAB — C3 AND C4
C3 Complement: 135 mg/dL (ref 83–193)
C4 Complement: 28 mg/dL (ref 15–57)

## 2024-06-12 LAB — ANTI-SMITH ANTIBODY: ENA SM Ab Ser-aCnc: <1 AI

## 2024-06-12 LAB — SEDIMENTATION RATE: Sed Rate: 2 mm/h (ref 0–20)

## 2024-06-12 LAB — ANTI-DNA ANTIBODY, DOUBLE-STRANDED: ds DNA Ab: 2 [IU]/mL

## 2024-06-12 LAB — ANA: Anti Nuclear Antibody (ANA): POSITIVE — AB

## 2024-06-14 NOTE — Progress Notes (Signed)
 ANA is low titer positive.  All other labs unremarkable.  I will discuss results at the follow-up visit.

## 2024-06-15 ENCOUNTER — Encounter: Payer: Self-pay | Admitting: Family Medicine

## 2024-06-15 ENCOUNTER — Encounter: Payer: Self-pay | Admitting: Radiology

## 2024-06-15 DIAGNOSIS — Z0000 Encounter for general adult medical examination without abnormal findings: Secondary | ICD-10-CM

## 2024-06-15 NOTE — Addendum Note (Signed)
 Addended by: ALMARIE BIRMINGHAM B on: 06/15/2024 12:39 PM   Modules accepted: Orders

## 2024-06-16 ENCOUNTER — Ambulatory Visit (INDEPENDENT_AMBULATORY_CARE_PROVIDER_SITE_OTHER): Admitting: Dermatology

## 2024-06-16 ENCOUNTER — Other Ambulatory Visit (INDEPENDENT_AMBULATORY_CARE_PROVIDER_SITE_OTHER)

## 2024-06-16 VITALS — Wt 113.0 lb

## 2024-06-16 DIAGNOSIS — L700 Acne vulgaris: Secondary | ICD-10-CM

## 2024-06-16 DIAGNOSIS — Z79899 Other long term (current) drug therapy: Secondary | ICD-10-CM

## 2024-06-16 DIAGNOSIS — Z7189 Other specified counseling: Secondary | ICD-10-CM | POA: Diagnosis not present

## 2024-06-16 DIAGNOSIS — Z0000 Encounter for general adult medical examination without abnormal findings: Secondary | ICD-10-CM

## 2024-06-16 DIAGNOSIS — L853 Xerosis cutis: Secondary | ICD-10-CM

## 2024-06-16 LAB — LIPID PANEL
Cholesterol: 181 mg/dL (ref 0–200)
HDL: 103.7 mg/dL (ref >39.00)
LDL Cholesterol: 61 mg/dL (ref 0–99)
NonHDL: 77.24
Total CHOL/HDL Ratio: 2
Triglycerides: 83 mg/dL (ref 0.0–149.0)
VLDL: 16.6 mg/dL (ref 0.0–40.0)

## 2024-06-16 LAB — POCT URINE PREGNANCY: Preg Test, Ur: NEGATIVE

## 2024-06-16 MED ORDER — ISOTRETINOIN 10 MG PO CAPS: 30.0000 mg | ORAL_CAPSULE | Freq: Every day | ORAL | 0 refills | Status: DC

## 2024-06-16 NOTE — Progress Notes (Addendum)
 Isotretinoin  Follow-Up Visit   Subjective  Marie Hanson is a 43 y.o. female who presents for the following: Isotretinoin  follow-up   Marie Hanson is a 43 year old female who presents for follow-up of isotretinoin  treatment for acne.  She is currently on isotretinoin  20 mg daily for acne treatment. She uses Aquaphor lip balm and emollients to manage the dryness.  She describes a recent 'second purge' of acne, primarily affecting the lower face, which was not experienced during her initial treatment phase. This flare-up was not associated with her menstrual cycle as she skips her periods using birth control pills.  She has been experiencing stress related to prescription issues, including errors in the prescription and delays in receiving her medication. A particularly stressful incident occurred when she was unable to obtain her medication for a week due to a back order at the pharmacy.  Her current skincare routine includes washing her face, applying hyaluronic acid, and using Aveeno oat gel and Avene as moisturizers. She also uses sucralfate at night for additional hydration.  She is concerned about eye dryness, which she manages with Restasis and hot compresses. She uses eye drops twice daily and is willing to increase to three times daily if needed.  Patient (and/or pt guardian) consented to the use of AI-assisted tools for note generation.  Week # 13   Isotretinoin  F/U - 06/16/24 1100       Isotretinoin  Follow Up   iPledge # [id number]    Date 06/16/24    Weight 113 lb (51.3 kg)    Two Forms of Birth Control Oral Contraceptives (w/ estrogen);Female Vasectomy    Acne breakouts since last visit? Yes      Dosage   Target Dosage (mg) 7500    Current (To Date) Dosage (mg) 1190    To Go Dosage (mg) 6310      Skin Side Effects   Dry Lips No    Nose bleeds No    Dry eyes Yes    Dry Skin No    Sunburn No           Current Dose 22mg   Side effects: Dry  skin, dry lips  Patient is not pregnant, not seeking pregnancy, and not breastfeeding.   The following portions of the chart were reviewed this encounter and updated as appropriate: medications, allergies, medical history  Review of Systems:  No other skin or systemic complaints except as noted in HPI or Assessment and Plan.  Objective  Well appearing patient in no apparent distress; mood and affect are within normal limits.  An examination of the face, neck, chest, and back was performed and relevant findings are noted below.     Assessment & Plan   HIGH RISK MEDICATION USE   Related Procedures POCT urine pregnancy Related Medications isotretinoin  (ACCUTANE ) 10 MG capsule Take 3 capsules (30 mg total) by mouth daily. ACNE VULGARIS   Related Medications YASMIN  28 3-0.03 MG tablet Take 1 tablet by mouth daily. Azelaic Acid  15 % gel Apply 1 Application topically 2 (two) times daily. After skin is thoroughly washed and patted dry, gently but thoroughly massage a thin film of azelaic acid  cream into the affected area twice daily, in the morning and evening. isotretinoin  (ACCUTANE ) 10 MG capsule Take 3 capsules (30 mg total) by mouth daily.  ACNE VULGARIS Patient is currently on Isotretinoin  requiring FDA mandated monthly evaluations and laboratory monitoring. Condition is currently not to goal (must reach target dose based on weight  and also have clear skin for 2 months prior to discontinuation in order to help prevent relapse)  Exam findings: Dry spots around the mouth  Week # 13 Pharmacy [facility] # [id number] Total mg -  1190 Total mg/kg - 23 mg/kg Birth Control- Vasectomy and Oral Birth Control   Mild dryness. Current treatment with 20 mg isotretinoin  daily is partially effective but not fully controlling the condition. Recent flare-up noted, possibly related to menstrual cycle, but she has skipped periods on birth control. Increasing isotretinoin  to 30 mg  daily is considered to better control flares. Discussed potential side effects, including increased dryness, and strategies to manage them. She is willing to try the increased dose. No harm anticipated from increasing the dose, and she can revert to 20 mg if necessary.   - Increased isotretinoin  to 30 mg once daily. - Continue Aquaphor lip balm and hyaluronic acid for skin hydration. - Use sucralfate at night for additional moisture. - Use Restasis and Mybo eye drops twice daily, increase to three times daily if needed. - Apply hot compresses to eyes as needed. - Ensure two forms of birth control are documented for iPledge compliance.        Urine pregnancy test performed in office today and was negative.  Patient demonstrates comprehension and confirms she will not get pregnant.   Patient confirmed in iPledge and isotretinoin  sent to pharmacy.   Isotretinoin  Counseling; Review and Contraception Counseling: Reviewed potential side effects of isotretinoin  including xerosis, cheilitis, hepatitis, hyperlipidemia, and severe birth defects if taken by a pregnant woman.  Women on isotretinoin  must be celibate (not having sex) or required to use at least 2 birth control methods to prevent pregnancy (unless patient is a female of non-child bearing potential).  Females of child-bearing potential must have monthly pregnancy tests while on isotretinoin  and report through I-Pledge (FDA monitoring program). Reviewed reports of suicidal ideation in those with a history of depression while taking isotretinoin  and reports of diagnosis of inflammatory bowl disease (IBD) while taking isotretinoin  as well as the lack of evidence for a causal relationship between isotretinoin , depression and IBD. Patient advised to reach out with any questions or concerns. Patient advised not to share pills or donate blood while on treatment or for one month after completing treatment. All patient's considering Isotretinoin  must  read and understand and sign Isotretinoin  Consent Form and be registered with I-Pledge.  Xerosis secondary to isotretinoin  therapy - Continue emollients as directed    Cheilitis secondary to isotretinoin  therapy - Continue Aquaphor as directed   Long term medication management (isotretinoin )  Patient is using long term (months to years) prescription medication  to control their dermatologic condition.  These medications require periodic monitoring to evaluate for efficacy and side effects and may require periodic laboratory monitoring.  - While taking Isotretinoin  and for 30 days after you finish the medication, do not get pregnant, do not share pills, do not donate blood. Isotretinoin  is best absorbed when taken with a fatty meal. Isotretinoin  can make you sensitive to the sun. Daily careful sun protection including sunscreen SPF 30+ when outdoors is recommended.  Follow-up in 30 days.  I, Gordan Beams, CMA, am acting as scribe for [employer], DO.   Documentation: I have reviewed the above documentation for accuracy and completeness, and I agree with the above.  Delon Lenis, DO

## 2024-06-16 NOTE — Patient Instructions (Signed)

## 2024-06-17 ENCOUNTER — Encounter: Payer: Self-pay | Admitting: Dermatology

## 2024-06-17 LAB — TSH: TSH: 1.29 u[IU]/mL (ref 0.35–5.50)

## 2024-06-21 ENCOUNTER — Encounter: Payer: Self-pay | Admitting: Dermatology

## 2024-06-22 ENCOUNTER — Encounter: Payer: Self-pay | Admitting: Family Medicine

## 2024-06-22 ENCOUNTER — Ambulatory Visit (INDEPENDENT_AMBULATORY_CARE_PROVIDER_SITE_OTHER): Admitting: Family Medicine

## 2024-06-22 VITALS — BP 117/70 | HR 64 | Ht 62.5 in | Wt 112.0 lb

## 2024-06-22 DIAGNOSIS — Z0000 Encounter for general adult medical examination without abnormal findings: Secondary | ICD-10-CM | POA: Diagnosis not present

## 2024-06-22 NOTE — Progress Notes (Signed)
 Complete physical exam  Patient: Marie Hanson   DOB: 06-Sep-1980   43 y.o. Female  MRN: [id number]  Subjective:    Chief Complaint  Patient presents with   Annual Exam     Marie Hanson is a 43 y.o. female who presents today for a complete physical exam. She reports consuming a general diet. She gets regular exercise.  She generally feels fairly well. She reports sleeping fairly well. She does not have additional problems to discuss today.   Currently lives with: husband Acute concerns or interim problems since last visit: still having hydration/constipation concerns, seeing rheumatology tomorrow and is considering following up with GI again  Vision concerns: no Dental concerns: no STD concerns: no  ETOH use: no Nicotine use: no Recreational drugs/illegal substances: no  Females:  She is currently  sexually active  Contraception choices are: OCP LMP: No LMP recorded. (Menstrual status: Oral contraceptives).       Most recent fall risk assessment:    06/22/2024    8:31 AM  Fall Risk   Falls in the past year? 0  Number falls in past yr: 0  Injury with Fall? 0  Risk for fall due to : No Fall Risks  Follow up Falls evaluation completed     Most recent depression screenings:    06/22/2024    8:31 AM 11/28/2023    2:03 PM  PHQ 2/9 Scores  PHQ - 2 Score 2 2  PHQ- 9 Score 8 7      Data saved with a previous flowsheet row definition            Patient Care Team: Almarie Waddell NOVAK, NP as PCP - General (Family Medicine)   Outpatient Medications Prior to Visit  Medication Sig   Azelaic Acid  15 % gel Apply 1 Application topically 2 (two) times daily. After skin is thoroughly washed and patted dry, gently but thoroughly massage a thin film of azelaic acid  cream into the affected area twice daily, in the morning and evening.   CEQUA 0.09 % SOLN Apply 1 drop to eye 2 (two) times daily.   EPINEPHrine  0.3 mg/0.3 mL IJ SOAJ injection     erythromycin ophthalmic ointment SMARTSIG:1 Application Left Eye As Directed   ibuprofen (ADVIL) 200 MG tablet Take 200 mg by mouth every 6 (six) hours as needed for mild pain (pain score 1-3).   isotretinoin  (ACCUTANE ) 10 MG capsule Take 3 capsules (30 mg total) by mouth daily.   MIEBO 1.338 GM/ML SOLN Apply to eye.   montelukast  (SINGULAIR ) 10 MG tablet TAKE 1 TABLET BY MOUTH EVERYDAY AT BEDTIME   Multiple Vitamin (MULTIVITAMIN) tablet Take 1 tablet by mouth daily.   nystatin  (MYCOSTATIN ) 500000 units TABS tablet Take 1 tablet by mouth 3 (three) times daily.   spironolactone  (ALDACTONE ) 50 MG tablet Take 150 mg by mouth daily.   YASMIN  28 3-0.03 MG tablet Take 1 tablet by mouth daily.   [DISCONTINUED] loteprednol (LOTEMAX) 0.5 % ophthalmic suspension 1 drop as directed.   [DISCONTINUED] meloxicam  (MOBIC ) 15 MG tablet One tab PO qAM with breakfast for 2 weeks, then daily prn pain.   [DISCONTINUED] RESTASIS 0.05 % ophthalmic emulsion  (Patient not taking: Reported on 05/19/2024)   [DISCONTINUED] terconazole  (TERAZOL 3 ) 0.8 % vaginal cream Place 1 applicator vaginally at bedtime.   [DISCONTINUED] trimethoprim -polymyxin b  (POLYTRIM ) ophthalmic solution Medication documented during telehealth visit.   Facility-Administered Medications Prior to Visit  Medication Dose Route Frequency Provider   omalizumab  (XOLAIR )  prefilled syringe 300 mg  300 mg Subcutaneous Q28 days Marinda Rocky SAILOR, MD    ROS All review of systems negative except what is listed in the HPI        Objective:     BP 117/70   Pulse 64   Ht 5' 2.5 (1.588 m)   Wt 112 lb (50.8 kg)   SpO2 100%   BMI 20.16 kg/m    Physical Exam Vitals reviewed.  Constitutional:      General: She is not in acute distress.    Appearance: Normal appearance. She is not ill-appearing.  HENT:     Head: Normocephalic and atraumatic.     Right Ear: Tympanic membrane normal.     Left Ear: Tympanic membrane normal.     Nose: Nose normal.      Mouth/Throat:     Mouth: Mucous membranes are moist.     Pharynx: Oropharynx is clear.  Eyes:     Extraocular Movements: Extraocular movements intact.     Conjunctiva/sclera: Conjunctivae normal.     Pupils: Pupils are equal, round, and reactive to light.  Cardiovascular:     Rate and Rhythm: Normal rate and regular rhythm.     Pulses: Normal pulses.     Heart sounds: Normal heart sounds.  Pulmonary:     Effort: Pulmonary effort is normal.     Breath sounds: Normal breath sounds.  Abdominal:     General: Abdomen is flat. Bowel sounds are normal. There is no distension.     Palpations: Abdomen is soft. There is no mass.     Tenderness: There is no abdominal tenderness. There is no right CVA tenderness, left CVA tenderness, guarding or rebound.  Genitourinary:    Comments: Deferred exam Musculoskeletal:        General: Normal range of motion.     Cervical back: Normal range of motion and neck supple. No tenderness.     Right lower leg: No edema.     Left lower leg: No edema.  Lymphadenopathy:     Cervical: No cervical adenopathy.  Skin:    General: Skin is warm and dry.     Capillary Refill: Capillary refill takes less than 2 seconds.  Neurological:     General: No focal deficit present.     Mental Status: She is alert and oriented to person, place, and time. Mental status is at baseline.  Psychiatric:        Mood and Affect: Mood normal.        Behavior: Behavior normal.        Thought Content: Thought content normal.        Judgment: Judgment normal.         No results found for any visits on 06/22/24.     Assessment & Plan:    Routine Health Maintenance and Physical Exam Discussed health promotion and safety including diet and exercise recommendations, dental health, and injury prevention. Tobacco cessation if applicable. Seat belts, sunscreen, smoke detectors, etc.    Immunization History  Administered Date(s) Administered   INFLUENZA, HIGH DOSE SEASONAL PF  03/31/2022   Influenza, Mdck, Trivalent,PF 6+ MOS(egg free) 05/04/2024   Influenza, Seasonal, Injecte, Preservative Fre 05/11/2023   Influenza,inj,Quad PF,6+ Mos 03/28/2019   Influenza-Unspecified 04/03/2019, 04/20/2020, 04/27/2021   Novavax(Covid-19) Vaccine 08/29/2023   PFIZER(Purple Top)SARS-COV-2 Vaccination 11/12/2019, 12/07/2019, 07/01/2020   Tdap 03/05/2019    Health Maintenance  Topic Date Due   Hepatitis B Vaccines 19-59 Average Risk (1 of 3 -  19+ 3-dose series) 06/19/2025 (Originally 03/07/2001)   HPV VACCINES (1 - 3-dose SCDM series) 06/19/2025 (Originally 03/07/2009)   COVID-19 Vaccine (5 - 2025-26 season) 06/19/2025 (Originally 04/13/2024)   Mammogram  09/03/2025   Cervical Cancer Screening (HPV/Pap Cotest)  11/15/2027   DTaP/Tdap/Td (2 - Td or Tdap) 03/04/2029   Influenza Vaccine  Completed   Hepatitis C Screening  Completed   HIV Screening  Completed   Pneumococcal Vaccine  Aged Out   Meningococcal B Vaccine  Aged Out        Problem List Items Addressed This Visit   None Visit Diagnoses       Annual physical exam    -  Primary         PATIENT COUNSELING:   Advised to take 1 mg of folate supplement per day if capable of pregnancy.   Recommend that most people either abstain from alcohol or drink within safe limits (<=14/week and <=4 drinks/occasion for males, <=7/weeks and <= 3 drinks/occasion for females) and that the risk for alcohol disorders and other health effects rises proportionally with the number of drinks per week and how often a drinker exceeds daily limits.   Diet: Recommend to adjust caloric intake to maintain or achieve ideal body weight, to reduce intake of dietary saturated fat and total fat, to limit sodium intake by avoiding high sodium foods and not adding table salt, and to maintain adequate dietary potassium and calcium preferably from fresh fruits, vegetables, and low-fat dairy products.   Emphasized the importance of regular  exercise.  Injury prevention: Recommend seatbelts, safety helmets, smoke detector, etc..   Dental health: Recommend regular tooth brushing, flossing, and dental visits.       Return in about 1 year (around 06/22/2025) for physical.     Waddell KATHEE Mon, NP  I,Emily Lagle,acting as a scribe for Waddell KATHEE Mon, NP.,have documented all relevant documentation on the behalf of Waddell KATHEE Mon, NP.  I, Waddell KATHEE Mon, NP, have reviewed all documentation for this visit. The documentation on 06/22/2024 for the exam, diagnosis, procedures, and orders are all accurate and complete.

## 2024-06-23 ENCOUNTER — Ambulatory Visit: Attending: Rheumatology | Admitting: Rheumatology

## 2024-06-23 ENCOUNTER — Encounter: Payer: Self-pay | Admitting: Rheumatology

## 2024-06-23 VITALS — BP 121/86 | HR 68 | Temp 98.0°F | Resp 13 | Ht 63.0 in | Wt 112.6 lb

## 2024-06-23 DIAGNOSIS — J302 Other seasonal allergic rhinitis: Secondary | ICD-10-CM

## 2024-06-23 DIAGNOSIS — U099 Post covid-19 condition, unspecified: Secondary | ICD-10-CM

## 2024-06-23 DIAGNOSIS — M249 Joint derangement, unspecified: Secondary | ICD-10-CM

## 2024-06-23 DIAGNOSIS — R5383 Other fatigue: Secondary | ICD-10-CM | POA: Diagnosis not present

## 2024-06-23 DIAGNOSIS — Z9109 Other allergy status, other than to drugs and biological substances: Secondary | ICD-10-CM

## 2024-06-23 DIAGNOSIS — R21 Rash and other nonspecific skin eruption: Secondary | ICD-10-CM

## 2024-06-23 DIAGNOSIS — J3089 Other allergic rhinitis: Secondary | ICD-10-CM

## 2024-06-23 DIAGNOSIS — L719 Rosacea, unspecified: Secondary | ICD-10-CM

## 2024-06-23 DIAGNOSIS — L700 Acne vulgaris: Secondary | ICD-10-CM

## 2024-06-23 DIAGNOSIS — F419 Anxiety disorder, unspecified: Secondary | ICD-10-CM

## 2024-06-23 DIAGNOSIS — I7300 Raynaud's syndrome without gangrene: Secondary | ICD-10-CM

## 2024-06-23 DIAGNOSIS — M2550 Pain in unspecified joint: Secondary | ICD-10-CM

## 2024-06-23 DIAGNOSIS — R7689 Other specified abnormal immunological findings in serum: Secondary | ICD-10-CM

## 2024-06-23 DIAGNOSIS — M359 Systemic involvement of connective tissue, unspecified: Secondary | ICD-10-CM | POA: Diagnosis not present

## 2024-06-23 DIAGNOSIS — L508 Other urticaria: Secondary | ICD-10-CM

## 2024-06-23 DIAGNOSIS — Z79899 Other long term (current) drug therapy: Secondary | ICD-10-CM

## 2024-06-23 MED ORDER — HYDROXYCHLOROQUINE SULFATE 200 MG PO TABS: 200.0000 mg | ORAL_TABLET | Freq: Every day | ORAL | 2 refills | Status: DC

## 2024-06-23 NOTE — Patient Instructions (Signed)
 Standing Labs We placed an order today for your standing lab work.   Please have your standing labs drawn in a month, 3 months and then every 5 months  Please have your labs drawn 2 weeks prior to your appointment so that the provider can discuss your lab results at your appointment, if possible.  Please note that you may see your imaging and lab results in MyChart before we have reviewed them. We will contact you once all results are reviewed. Please allow our office up to 72 hours to thoroughly review all of the results before contacting the office for clarification of your results.  WALK-IN LAB HOURS  Monday through Thursday from 8:00 am -12:30 pm and 1:00 pm-4:30 pm and Friday from 8:00 am-12:00 pm.  Patients with office visits requiring labs will be seen before walk-in labs.  You may encounter longer than normal wait times. Please allow additional time. Wait times may be shorter on  Monday and Thursday afternoons.  We do not book appointments for walk-in labs. We appreciate your patience and understanding with our staff.   Labs are drawn by [facility]. Please bring your co-pay at the time of your lab draw.  You may receive a bill from [facility] for your lab work.  Please note if you are on Hydroxychloroquine and and an order has been placed for a Hydroxychloroquine level,  you will need to have it drawn 4 hours or more after your last dose.  If you wish to have your labs drawn at another location, please call the office 24 hours in advance so we can fax the orders.  The office is located at [address], [place], [place] [address]   If you have any questions regarding directions or hours of operation,  please call [phone].   As a reminder, please drink plenty of water prior to coming for your lab work. Thanks!  Vaccines You are taking a medication(s) that can suppress your immune system.  The following immunizations are recommended: Flu annually Covid-19  RSV Td/Tdap  (tetanus, diphtheria, pertussis) every 10 years Pneumonia (Prevnar 15 then Pneumovax 23 at least 1 year apart.  Alternatively, can take Prevnar 20 without needing additional dose) Shingrix: 2 doses from 4 weeks to 6 months apart  Please check with your PCP to make sure you are up to date.

## 2024-06-25 ENCOUNTER — Ambulatory Visit: Payer: Managed Care, Other (non HMO) | Admitting: Rheumatology

## 2024-07-16 ENCOUNTER — Ambulatory Visit (INDEPENDENT_AMBULATORY_CARE_PROVIDER_SITE_OTHER): Admitting: Dermatology

## 2024-07-16 ENCOUNTER — Encounter: Payer: Self-pay | Admitting: Dermatology

## 2024-07-16 VITALS — BP 114/73 | Wt 113.0 lb

## 2024-07-16 DIAGNOSIS — H04123 Dry eye syndrome of bilateral lacrimal glands: Secondary | ICD-10-CM | POA: Diagnosis not present

## 2024-07-16 DIAGNOSIS — Z79899 Other long term (current) drug therapy: Secondary | ICD-10-CM | POA: Diagnosis not present

## 2024-07-16 DIAGNOSIS — L659 Nonscarring hair loss, unspecified: Secondary | ICD-10-CM

## 2024-07-16 DIAGNOSIS — L700 Acne vulgaris: Secondary | ICD-10-CM | POA: Diagnosis not present

## 2024-07-16 DIAGNOSIS — L658 Other specified nonscarring hair loss: Secondary | ICD-10-CM | POA: Diagnosis not present

## 2024-07-16 DIAGNOSIS — Z7189 Other specified counseling: Secondary | ICD-10-CM

## 2024-07-16 MED ORDER — ISOTRETINOIN 10 MG PO CAPS: 30.0000 mg | ORAL_CAPSULE | Freq: Every day | ORAL | 0 refills | Status: AC

## 2024-07-16 MED ORDER — MINOXIDIL 2.5 MG PO TABS: 1.2500 mg | ORAL_TABLET | Freq: Every day | ORAL | 2 refills | Status: DC

## 2024-07-16 NOTE — Progress Notes (Signed)
 Isotretinoin  Follow-Up Visit   Subjective  Marie Hanson is a 43 y.o. female who presents for the following: Isotretinoin  follow-up  Week # 17  Current dose: isotretinoin  (ACCUTANE ) 10 MG capsule - Take 3 capsules (30 mg total) by mouth daily.   Marie Hanson is a 43 year old female who presents for follow-up on Accutane  treatment.  She is currently on a regimen of 30 mg of Accutane , which she has been taking for 18 days this month. However, she experiences slightly increased dryness in her eyes, but not her lips. She continues to use Restasis and Mavik for her dry eyes and recently had tear duct plugs reinserted.  She reports increased hair loss in the shower and is considering treatment options. She takes a multivitamin, vitamin D , vitamin C, and magnesium. She has previously tried collagen and B vitamins, including biotin, but these caused acne when her skin was healthy, so she avoids them.  She takes a powdered fiber supplement with her breakfast but has moved it to lunch to avoid potential absorption issues with Accutane . She ensures her breakfast includes healthy fats to aid in the absorption of the medication. She does not take additional vitamin A supplements   Isotretinoin  F/U - 07/16/24 0900       Isotretinoin  Follow Up   iPledge # [id number]    Date 07/16/24    Weight 113 lb (51.3 kg)    Two Forms of Birth Control Oral Contraceptives (w/ estrogen);Female Vasectomy    Acne breakouts since last visit? Yes      Dosage   Target Dosage (mg) 7500    Current (To Date) Dosage (mg) 2090    To Go Dosage (mg) 5410      Skin Side Effects   Dry Lips Yes    Nose bleeds No    Dry eyes Yes    Dry Skin No    Sunburn No      Gastrointestinal Side Effects   Nausea No    Diarrhea No    Blood in stool No      Neurological Side Effects   Blurred vision No    Depression No    Headache No    Homicidal thoughts No    Mood Changes No    Suicidal thoughts No       Constitutional Side Effects   Fatigue No      Musculoskeletal Side Effects   Muscle aches Yes      Labs Notes   Last labs done 07/16/24    Lab comment negative           Side effects: Dry skin, dry lips  Patient is not pregnant, not seeking pregnancy, and not breastfeeding.   The following portions of the chart were reviewed this encounter and updated as appropriate: medications, allergies, medical history  Review of Systems:  No other skin or systemic complaints except as noted in HPI or Assessment and Plan.  Objective  Well appearing patient in no apparent distress; mood and affect are within normal limits.  An examination of the face, neck, chest, and back was performed and relevant findings are noted below.           Assessment & Plan    Drug-induced hair loss Increased hair shedding in the shower, likely related to Accutane  use. Discussed starting oral minoxidil  at a low dose to mitigate hair loss. Ninety percent of patients on low-dose oral minoxidil  experience positive results. Risks include unwanted hair growth  and lightheadedness, though these are rare at the prescribed dose. Hair regrowth may take 3-6 months. - Prescribed oral minoxidil  at half a tablet of the lowest dose. - Monitor for unwanted hair growth and lightheadedness. - Continue multivitamin, vitamin D , and magnesium supplementation. - Avoid collagen and biotin due to past acne exacerbation.  Dry eye syndrome Slightly worsened dry eyes on current Accutane  dose. Restasis and Mavik are being used to manage symptoms. Tear duct plugs have been reinserted, providing quick relief. Advised to check vitamin A content in multivitamin to avoid exacerbating dryness. - Continue Restasis and Mavik for symptom management. - Check vitamin A content in multivitamin; avoid if over 20%.  Accutane  therapy for ACNE VULGARIS Patient is currently on Isotretinoin  requiring FDA mandated monthly evaluations and  laboratory monitoring. Condition is currently not to goal (must reach target dose based on weight and also have clear skin for 2 months prior to discontinuation in order to help prevent relapse)  Exam findings: No acne lesions  Week # 17 Pharmacy [facility] # [id number] Total mg -  2090 Total mg/kg - 40 mg/kg Birth Control- Vasectomy and Oral Birth Control   Continue isotretinoin    Urine pregnancy test performed in office today and was negative.  Patient demonstrates comprehension and confirms she will not get pregnant.   Patient confirmed in iPledge and isotretinoin  sent to pharmacy.   Isotretinoin  Counseling; Review and Contraception Counseling: Reviewed potential side effects of isotretinoin  including xerosis, cheilitis, hepatitis, hyperlipidemia, and severe birth defects if taken by a pregnant woman.  Women on isotretinoin  must be celibate (not having sex) or required to use at least 2 birth control methods to prevent pregnancy (unless patient is a female of non-child bearing potential).  Females of child-bearing potential must have monthly pregnancy tests while on isotretinoin  and report through I-Pledge (FDA monitoring program). Reviewed reports of suicidal ideation in those with a history of depression while taking isotretinoin  and reports of diagnosis of inflammatory bowl disease (IBD) while taking isotretinoin  as well as the lack of evidence for a causal relationship between isotretinoin , depression and IBD. Patient advised to reach out with any questions or concerns. Patient advised not to share pills or donate blood while on treatment or for one month after completing treatment. All patient's considering Isotretinoin  must read and understand and sign Isotretinoin  Consent Form and be registered with I-Pledge.  Xerosis secondary to isotretinoin  therapy  Cheilitis secondary to isotretinoin  therapy   Long term medication management (isotretinoin )  Patient is using long term  (months to years) prescription medication  to control their dermatologic condition.  These medications require periodic monitoring to evaluate for efficacy and side effects and may require periodic laboratory monitoring.  - While taking Isotretinoin  and for 30 days after you finish the medication, do not get pregnant, do not share pills, do not donate blood. Isotretinoin  is best absorbed when taken with a fatty meal. Isotretinoin  can make you sensitive to the sun. Daily careful sun protection including sunscreen SPF 30+ when outdoors is recommended.  Follow-up in 30 days.    Documentation: I have reviewed the above documentation for accuracy and completeness, and I agree with the above.      Delon Lenis, DO

## 2024-07-16 NOTE — Patient Instructions (Signed)

## 2024-07-22 ENCOUNTER — Other Ambulatory Visit: Payer: Self-pay | Admitting: Family Medicine

## 2024-07-22 DIAGNOSIS — Z1231 Encounter for screening mammogram for malignant neoplasm of breast: Secondary | ICD-10-CM

## 2024-07-27 ENCOUNTER — Encounter: Payer: Self-pay | Admitting: Family Medicine

## 2024-07-27 DIAGNOSIS — Z8639 Personal history of other endocrine, nutritional and metabolic disease: Secondary | ICD-10-CM

## 2024-07-27 DIAGNOSIS — E538 Deficiency of other specified B group vitamins: Secondary | ICD-10-CM

## 2024-08-10 ENCOUNTER — Encounter: Payer: Self-pay | Admitting: Obstetrics and Gynecology

## 2024-08-10 ENCOUNTER — Ambulatory Visit: Admitting: Obstetrics and Gynecology

## 2024-08-10 ENCOUNTER — Ambulatory Visit: Payer: Self-pay | Admitting: Obstetrics and Gynecology

## 2024-08-10 VITALS — BP 118/64 | HR 67 | Temp 97.7°F | Ht 63.25 in | Wt 114.0 lb

## 2024-08-10 DIAGNOSIS — Z01419 Encounter for gynecological examination (general) (routine) without abnormal findings: Secondary | ICD-10-CM

## 2024-08-10 DIAGNOSIS — R232 Flushing: Secondary | ICD-10-CM

## 2024-08-10 DIAGNOSIS — R102 Pelvic and perineal pain unspecified side: Secondary | ICD-10-CM

## 2024-08-10 DIAGNOSIS — Z1331 Encounter for screening for depression: Secondary | ICD-10-CM | POA: Diagnosis not present

## 2024-08-10 DIAGNOSIS — L700 Acne vulgaris: Secondary | ICD-10-CM | POA: Diagnosis not present

## 2024-08-10 DIAGNOSIS — N898 Other specified noninflammatory disorders of vagina: Secondary | ICD-10-CM | POA: Diagnosis not present

## 2024-08-10 LAB — URINALYSIS, COMPLETE W/RFL CULTURE
Bacteria, UA: NONE SEEN /HPF
Bilirubin Urine: NEGATIVE
Glucose, UA: NEGATIVE
Hyaline Cast: NONE SEEN /LPF
Ketones, ur: NEGATIVE
Leukocyte Esterase: NEGATIVE
Nitrites, Initial: NEGATIVE
Protein, ur: NEGATIVE
RBC / HPF: NONE SEEN /HPF (ref 0–2)
Specific Gravity, Urine: 1.015 (ref 1.001–1.035)
WBC, UA: NONE SEEN /HPF (ref 0–5)
pH: 6.5 (ref 5.0–8.0)

## 2024-08-10 LAB — WET PREP FOR TRICH, YEAST, CLUE

## 2024-08-10 LAB — NO CULTURE INDICATED

## 2024-08-10 MED ORDER — YASMIN 28 3-0.03 MG PO TABS: 1.0000 | ORAL_TABLET | Freq: Every day | ORAL | 3 refills | Status: DC

## 2024-08-10 NOTE — Progress Notes (Unsigned)
 "  43 y.o. G0P0000 female with acne (followed by derm), hot flashes on COC, HSV, low AMH, unspecified connective tissue disease here for annual exam. Married. PCP: Almarie Waddell NOVAK, NP   No LMP recorded. (Menstrual status: Oral contraceptives).   She reports vaginal odor and dryness. Odor improved with use of boric acid. She has had severe pelvic cramping and pelvic pressure in the vaginal area that wraps around to lower back. This occurs after orgasm and massage of upper thighs and lower trunk. Symptoms for 6 months or more.   No pain during intercourse or orgasm but the pain follows her orgasm. Pain is sometimes relieved by a bowel movement. ROS +constipation, treated with miralax. She is no longer having sex due to this. She is experiencing post void dribbling. She desires a work-up for these issues.  Urine sample provided: no  Abnormal bleeding: none Pelvic discharge or pain: as noted Breast mass, nipple discharge or skin changes : none  Sexually active: yes Birth control: COC Last PAP:     Component Value Date/Time   DIAGPAP  11/15/2022 1509    - Negative for intraepithelial lesion or malignancy (NILM)   DIAGPAP (A) 09/05/2020 1448    - Atypical squamous cells of undetermined significance (ASC-US )   HPVHIGH Negative 11/15/2022 1509   HPVHIGH Negative 09/05/2020 1448   ADEQPAP  11/15/2022 1509    Satisfactory for evaluation; transformation zone component ABSENT.   ADEQPAP  09/05/2020 1448    Satisfactory for evaluation; transformation zone component PRESENT.   Last mammogram: 09/04/23 Birads 1, Density D  Last DXA: 02/26/24  normal  Exercising: yes, cardio and weights Smoker: no  Garment/textile Technologist Visit from 08/10/2024 in [facility]  PHQ-2 Total Score 0    Flowsheet Row Office Visit from 06/22/2024 in [facility]  PHQ-9 Total Score 8     GYN HISTORY: Low AMH  OB History  Gravida Para  Term Preterm AB Living  0 0 0 0 0 0  SAB IAB Ectopic Multiple Live Births  0 0 0 0 0   Past Medical History:  Diagnosis Date   Allergy     Hay fever, pollen, mold etc.   Anal fissure    Anxiety    Cardiac arrhythmia    during chidlhood   Depression    Eczema    Ehlers-Danlos syndrome 2025   dx by ortho, Dr. Burnetta   Nipple pain x's 2 months   Rosacea    Urticaria    Past Surgical History:  Procedure Laterality Date   AUGMENTATION MAMMAPLASTY Bilateral    breast enhancement & removal   BREAST IMPLANT REMOVAL Bilateral    CYST REMOVAL HAND Left 2025   ganglion cyst   REFRACTIVE SURGERY Bilateral    Medications Ordered Prior to Encounter[1] Social History   Socioeconomic History   Marital status: Married    Spouse name: Not on file   Number of children: 0   Years of education: Not on file   Highest education level: Bachelor's degree (e.g., BA, AB, BS)  Occupational History   Not on file  Tobacco Use   Smoking status: Never    Passive exposure: Never   Smokeless tobacco: Never  Vaping Use   Vaping status: Never Used  Substance and Sexual Activity   Alcohol use: Not Currently   Drug use: Never   Sexual activity: Yes    Partners: Male    Birth control/protection:  OCP  Other Topics Concern   Not on file  Social History Narrative   Not on file   Social Drivers of Health   Tobacco Use: Low Risk (08/10/2024)   Patient History    Smoking Tobacco Use: Never    Smokeless Tobacco Use: Never    Passive Exposure: Never  Financial Resource Strain: Low Risk (06/16/2024)   Overall Financial Resource Strain (CARDIA)    Difficulty of Paying Living Expenses: Not hard at all  Food Insecurity: No Food Insecurity (06/16/2024)   Epic    Worried About Programme Researcher, Broadcasting/film/video in the Last Year: Never true    Ran Out of Food in the Last Year: Never true  Transportation Needs: No Transportation Needs (06/16/2024)   Epic    Lack of Transportation (Medical): No    Lack of  Transportation (Non-Medical): No  Physical Activity: Sufficiently Active (06/16/2024)   Exercise Vital Sign    Days of Exercise per Week: 5 days    Minutes of Exercise per Session: 30 min  Stress: Stress Concern Present (06/16/2024)   [employer] of Occupational Health - Occupational Stress Questionnaire    Feeling of Stress: Rather much  Social Connections: Socially Isolated (06/16/2024)   Social Connection and Isolation Panel    Frequency of Communication with Friends and Family: Twice a week    Frequency of Social Gatherings with Friends and Family: Never    Attends Religious Services: Never    Database Administrator or Organizations: No    Attends Engineer, Structural: Not on file    Marital Status: Married  Catering Manager Violence: Not on file  Depression (PHQ2-9): Low Risk (08/10/2024)   Depression (PHQ2-9)    PHQ-2 Score: 0  Recent Concern: Depression (PHQ2-9) - Medium Risk (06/22/2024)   Depression (PHQ2-9)    PHQ-2 Score: 8  Alcohol Screen: Not on file  Housing: Unknown (06/16/2024)   Epic    Unable to Pay for Housing in the Last Year: No    Number of Times Moved in the Last Year: Not on file    Homeless in the Last Year: No  Utilities: Low Risk (07/10/2023)   Received from [facility]   Utilities    In the past 12 months has the electric, gas, oil, or water company threatened to shut off services in your home? : No  Health Literacy: Not on file   Family History  Problem Relation Age of Onset   Urticaria Mother    Eczema Mother    Allergic rhinitis Mother    Diabetes Mother    Colon polyps Mother    Irritable bowel syndrome Mother    Hyperlipidemia Mother    Osteoporosis Mother    Hypertension Mother    Depression Father    Anxiety disorder Father    BRCA 1/2 Neg Hx    Breast cancer Neg Hx    Colitis Neg Hx    Esophageal cancer Neg Hx    Pancreatic cancer Neg Hx    Stomach cancer Neg Hx    Allergies[2]   PE Today's Vitals   08/10/24  1339  BP: 118/64  Pulse: 67  Temp: 97.7 F (36.5 C)  TempSrc: Oral  SpO2: 99%  Weight: 114 lb (51.7 kg)  Height: 5' 3.25 (1.607 m)   Body mass index is 20.03 kg/m.  Physical Exam Vitals reviewed. Exam conducted with a chaperone present.  Constitutional:      General: She is not in acute distress.  Appearance: Normal appearance.  HENT:     Head: Normocephalic and atraumatic.     Nose: Nose normal.  Eyes:     Extraocular Movements: Extraocular movements intact.     Conjunctiva/sclera: Conjunctivae normal.  Pulmonary:     Effort: Pulmonary effort is normal.  Chest:     Chest wall: No mass or tenderness.  Breasts:    Right: Normal. No swelling, mass, nipple discharge, skin change or tenderness.     Left: Normal. No swelling, mass, nipple discharge, skin change or tenderness.  Abdominal:     General: There is no distension.     Palpations: Abdomen is soft.     Tenderness: There is no abdominal tenderness.  Genitourinary:    General: Normal vulva.     Exam position: Lithotomy position.     Urethra: No prolapse.     Vagina: Normal. No vaginal discharge or bleeding.     Cervix: Normal. No lesion.     Uterus: Normal. Not enlarged and not tender.      Adnexa: Right adnexa normal and left adnexa normal.     Comments: Nabothian cyst of anterior cervix Musculoskeletal:        General: Normal range of motion.     Cervical back: Normal range of motion.  Lymphadenopathy:     Upper Body:     Right upper body: No axillary adenopathy.     Left upper body: No axillary adenopathy.     Lower Body: No right inguinal adenopathy. No left inguinal adenopathy.  Skin:    General: Skin is warm and dry.  Neurological:     General: No focal deficit present.     Mental Status: She is alert.  Psychiatric:        Mood and Affect: Mood normal.        Behavior: Behavior normal.       Assessment and Plan:        Well woman exam with routine gynecological exam Assessment &  Plan: Cervical cancer screening performed according to ASCCP guidelines. Encouraged annual mammogram screening Colonoscopy N/A  Labs and immunizations with her primary Encouraged safe sexual practices as indicated Encouraged healthy lifestyle practices with diet and exercise For patients under 50yo, I recommend 1000mg  calcium daily and 600IU of vitamin D  daily.   Vaginal odor -     WET PREP FOR TRICH, YEAST, CLUE  Acne vulgaris -     Yasmin  28; Take 1 tablet by mouth daily.  Dispense: 112 tablet; Refill: 3  Hot flashes -     Yasmin  28; Take 1 tablet by mouth daily.  Dispense: 112 tablet; Refill: 3  Negative depression screening  Pelvic pain Assessment & Plan: Reporting severe pelvic pain following orgasm and triggered by pressure sensation along the thighs and trunk Also reports postvoid dribbling Denies dyspareunia Normal exam Will check UA Recommend referral to [facility] for evaluation and pelvic pain clinic and for pelvic floor physical therapy Considering applying aquaphor or coconut oil to the vulva after showers for vaginal dryness.  Orders: -     Urinalysis,Complete w/RFL Culture -     Ambulatory referral to Gynecology -     Ambulatory referral to Physical Therapy  Other orders -     REFLEXIVE URINE CULTURE  15 min total time was spent for this patient encounter addressing pelvic pain, which is separate from the annual exam- including preparation, face-to-face counseling with the patient, coordination of care, and documentation.   Vera LULLA Pa, MD     [  1]  Current Outpatient Medications on File Prior to Visit  Medication Sig Dispense Refill   Azelaic Acid  15 % gel Apply 1 Application topically 2 (two) times daily. After skin is thoroughly washed and patted dry, gently but thoroughly massage a thin film of azelaic acid  cream into the affected area twice daily, in the morning and evening. 30 g 9   CEQUA 0.09 % SOLN Apply 1 drop to eye 2 (two) times daily.      EPINEPHrine  0.3 mg/0.3 mL IJ SOAJ injection      ibuprofen (ADVIL) 200 MG tablet Take 200 mg by mouth every 6 (six) hours as needed for mild pain (pain score 1-3).     isotretinoin  (ACCUTANE ) 10 MG capsule Take 3 capsules (30 mg total) by mouth daily. 90 capsule 0   MIEBO 1.338 GM/ML SOLN Apply to eye.     montelukast  (SINGULAIR ) 10 MG tablet TAKE 1 TABLET BY MOUTH EVERYDAY AT BEDTIME 90 tablet 1   Multiple Vitamin (MULTIVITAMIN) tablet Take 1 tablet by mouth daily.     spironolactone  (ALDACTONE ) 50 MG tablet Take 150 mg by mouth daily.     Current Facility-Administered Medications on File Prior to Visit  Medication Dose Route Frequency Provider Last Rate Last Admin   omalizumab  (XOLAIR ) prefilled syringe 300 mg  300 mg Subcutaneous Q28 days Marinda Rocky SAILOR, MD   300 mg at 05/14/23 1025  [2]  Allergies Allergen Reactions   Benzoyl Peroxide Dermatitis   "

## 2024-08-10 NOTE — Patient Instructions (Signed)
 Considering applying aquaphor or coconut oil to the vulva after showers. You could also using daily vaginal moisturizers by brands like Good Clean Love and Ah! Yes.  Health Maintenance, Female Adopting a healthy lifestyle and getting preventive care are important in promoting health and wellness. Ask your health care provider about: The right schedule for you to have regular tests and exams. Things you can do on your own to prevent diseases and keep yourself healthy. What should I know about diet, weight, and exercise? Eat a healthy diet  Eat a diet that includes plenty of vegetables, fruits, low-fat dairy products, and lean protein. Do not eat a lot of foods that are high in solid fats, added sugars, or sodium. Maintain a healthy weight Body mass index (BMI) is used to identify weight problems. It estimates body fat based on height and weight. Your health care provider can help determine your BMI and help you achieve or maintain a healthy weight. Get regular exercise Get regular exercise. This is one of the most important things you can do for your health. Most adults should: Exercise for at least 150 minutes each week. The exercise should increase your heart rate and make you sweat (moderate-intensity exercise). Do strengthening exercises at least twice a week. This is in addition to the moderate-intensity exercise. Spend less time sitting. Even light physical activity can be beneficial. Watch cholesterol and blood lipids Have your blood tested for lipids and cholesterol at 42 years of age, then have this test every 5 years. Have your cholesterol levels checked more often if: Your lipid or cholesterol levels are high. You are older than 42 years of age. You are at high risk for heart disease. What should I know about cancer screening? Depending on your health history and family history, you may need to have cancer screening at various ages. This may include screening for: Breast  cancer. Cervical cancer. Colorectal cancer. Skin cancer. Lung cancer. What should I know about heart disease, diabetes, and high blood pressure? Blood pressure and heart disease High blood pressure causes heart disease and increases the risk of stroke. This is more likely to develop in people who have high blood pressure readings or are overweight. Have your blood pressure checked: Every 3-5 years if you are 27-49 years of age. Every year if you are 65 years old or older. Diabetes Have regular diabetes screenings. This checks your fasting blood sugar level. Have the screening done: Once every three years after age 72 if you are at a normal weight and have a low risk for diabetes. More often and at a younger age if you are overweight or have a high risk for diabetes. What should I know about preventing infection? Hepatitis B If you have a higher risk for hepatitis B, you should be screened for this virus. Talk with your health care provider to find out if you are at risk for hepatitis B infection. Hepatitis C Testing is recommended for: Everyone born from [birth year] through 1964. Anyone with known risk factors for hepatitis C. Sexually transmitted infections (STIs) Get screened for STIs, including gonorrhea and chlamydia, if: You are sexually active and are younger than 42 years of age. You are older than 42 years of age and your health care provider tells you that you are at risk for this type of infection. Your sexual activity has changed since you were last screened, and you are at increased risk for chlamydia or gonorrhea. Ask your health care provider if you are at  risk. Ask your health care provider about whether you are at high risk for HIV. Your health care provider may recommend a prescription medicine to help prevent HIV infection. If you choose to take medicine to prevent HIV, you should first get tested for HIV. You should then be tested every 3 months for as long as you are taking  the medicine. Pregnancy If you are about to stop having your period (premenopausal) and you may become pregnant, seek counseling before you get pregnant. Take 400 to 800 micrograms (mcg) of folic acid every day if you become pregnant. Ask for birth control (contraception) if you want to prevent pregnancy. Osteoporosis and menopause Osteoporosis is a disease in which the bones lose minerals and strength with aging. This can result in bone fractures. If you are 25 years old or older, or if you are at risk for osteoporosis and fractures, ask your health care provider if you should: Be screened for bone loss. Take a calcium or vitamin D supplement to lower your risk of fractures. Be given hormone replacement therapy (HRT) to treat symptoms of menopause. Follow these instructions at home: Alcohol use Do not drink alcohol if: Your health care provider tells you not to drink. You are pregnant, may be pregnant, or are planning to become pregnant. If you drink alcohol: Limit how much you have to: 0-1 drink a day. Know how much alcohol is in your drink. In the [place] one drink equals one 12 oz bottle of beer (355 mL), one 5 oz glass of wine (148 mL), or one 1 oz glass of hard liquor (44 mL). Lifestyle Do not use any products that contain nicotine or tobacco. These products include cigarettes, chewing tobacco, and vaping devices, such as e-cigarettes. If you need help quitting, ask your health care provider. Do not use street drugs. Do not share needles. Ask your health care provider for help if you need support or information about quitting drugs. General instructions Schedule regular health, dental, and eye exams. Stay current with your vaccines. Tell your health care provider if: You often feel depressed. You have ever been abused or do not feel safe at home. Summary Adopting a healthy lifestyle and getting preventive care are important in promoting health and wellness. Follow your health  care provider's instructions about healthy diet, exercising, and getting tested or screened for diseases. Follow your health care provider's instructions on monitoring your cholesterol and blood pressure. This information is not intended to replace advice given to you by your health care provider. Make sure you discuss any questions you have with your health care provider. Document Revised: 12/19/2020 Document Reviewed: 12/19/2020 Elsevier Patient Education  2024 [employer].

## 2024-08-11 ENCOUNTER — Other Ambulatory Visit (INDEPENDENT_AMBULATORY_CARE_PROVIDER_SITE_OTHER)

## 2024-08-11 DIAGNOSIS — E538 Deficiency of other specified B group vitamins: Secondary | ICD-10-CM

## 2024-08-11 DIAGNOSIS — Z8639 Personal history of other endocrine, nutritional and metabolic disease: Secondary | ICD-10-CM | POA: Diagnosis not present

## 2024-08-12 ENCOUNTER — Encounter: Payer: Self-pay | Admitting: Obstetrics and Gynecology

## 2024-08-12 DIAGNOSIS — Z01419 Encounter for gynecological examination (general) (routine) without abnormal findings: Secondary | ICD-10-CM | POA: Insufficient documentation

## 2024-08-12 DIAGNOSIS — R102 Pelvic and perineal pain unspecified side: Secondary | ICD-10-CM | POA: Insufficient documentation

## 2024-08-12 LAB — IBC + FERRITIN
Ferritin: 50.1 ng/mL (ref 10.0–291.0)
Iron: 51 ug/dL (ref 42–145)
Saturation Ratios: 10.6 % — ABNORMAL LOW (ref 20.0–50.0)
TIBC: 480.2 ug/dL — ABNORMAL HIGH (ref 250.0–450.0)
Transferrin: 343 mg/dL (ref 212.0–360.0)

## 2024-08-12 LAB — B12 AND FOLATE PANEL
Folate: >23.7 ng/mL
Vitamin B-12: 282 pg/mL (ref 211–911)

## 2024-08-12 NOTE — Assessment & Plan Note (Addendum)
 Cervical cancer screening performed according to ASCCP guidelines. Encouraged annual mammogram screening Colonoscopy N/A  Labs and immunizations with her primary Encouraged safe sexual practices as indicated Encouraged healthy lifestyle practices with diet and exercise For patients under 42yo, I recommend 1000mg  calcium daily and 600IU of vitamin D  daily.

## 2024-08-12 NOTE — Assessment & Plan Note (Addendum)
 Reporting severe pelvic pain following orgasm and triggered by pressure sensation along the thighs and trunk Also reports postvoid dribbling Denies dyspareunia Normal exam Will check UA Recommend referral to [facility] for evaluation and pelvic pain clinic and for pelvic floor physical therapy Considering applying aquaphor or coconut oil to the vulva after showers for vaginal dryness.

## 2024-08-14 ENCOUNTER — Ambulatory Visit: Payer: Self-pay | Admitting: Family Medicine

## 2024-08-16 ENCOUNTER — Other Ambulatory Visit: Payer: Self-pay | Admitting: Family Medicine

## 2024-08-17 NOTE — Telephone Encounter (Signed)
 Rx refill request approved per Dr. Zollie Pee orders.

## 2024-08-18 NOTE — Telephone Encounter (Signed)
 Routing to Dr. Dallie to review and advise on pt request.   AEX 08/10/24

## 2024-08-18 NOTE — Telephone Encounter (Signed)
 Last AEX 08/10/24

## 2024-08-20 ENCOUNTER — Ambulatory Visit: Admitting: Dermatology

## 2024-08-21 ENCOUNTER — Other Ambulatory Visit: Payer: Self-pay | Admitting: Obstetrics and Gynecology

## 2024-08-21 DIAGNOSIS — R232 Flushing: Secondary | ICD-10-CM

## 2024-08-21 DIAGNOSIS — L700 Acne vulgaris: Secondary | ICD-10-CM

## 2024-08-21 NOTE — Telephone Encounter (Signed)
 Med refill request:   YASMIN  28 3-0.03 MG tablet  Start:  08/10/24 Disp:  112 tablets Refills:  3 ordered  Last AEX:  08/10/24 Next AEX:  Not yet scheduled Last MMG (if hormonal med):  09/04/23 Refill authorized? Please Advise.

## 2024-08-21 NOTE — Telephone Encounter (Signed)
 Rx just sent 07/31/24.

## 2024-09-04 ENCOUNTER — Ambulatory Visit

## 2024-09-07 ENCOUNTER — Ambulatory Visit

## 2024-09-08 ENCOUNTER — Encounter: Payer: Self-pay | Admitting: Obstetrics and Gynecology

## 2024-09-08 ENCOUNTER — Other Ambulatory Visit: Payer: Self-pay | Admitting: Obstetrics and Gynecology

## 2024-09-08 DIAGNOSIS — R232 Flushing: Secondary | ICD-10-CM

## 2024-09-08 DIAGNOSIS — L700 Acne vulgaris: Secondary | ICD-10-CM

## 2024-09-08 NOTE — Telephone Encounter (Signed)
 Med refill request: Marie Hanson  28 3-0.03 MG tablet Last AEX: 08/10/24 GH Next AEX: 08/11/25 GH Last MMG (if hormonal med) 09/04/23, scheduled 09/11/24 Refill authorized: Please Advise? Last Rx sent #112 with 3 refills on 08/10/24

## 2024-09-10 NOTE — Progress Notes (Unsigned)
 "  Office Visit Note  Patient: Marie Hanson             Date of Birth: Nov 06, 1980           MRN: [id number]             PCP: Marie Marie NOVAK, NP Referring: Marie Marie NOVAK, NP Visit Date: 09/24/2024 Occupation: Data Unavailable  Subjective:  No chief complaint on file.   History of Present Illness: Marie Hanson is a 44 y.o. female ***     Activities of Daily Living:  Patient reports morning stiffness for *** {minute/hour:19697}.   Patient {ACTIONS;DENIES/REPORTS:21021675::Denies} nocturnal pain.  Difficulty dressing/grooming: {ACTIONS;DENIES/REPORTS:21021675::Denies} Difficulty climbing stairs: {ACTIONS;DENIES/REPORTS:21021675::Denies} Difficulty getting out of chair: {ACTIONS;DENIES/REPORTS:21021675::Denies} Difficulty using hands for taps, buttons, cutlery, and/or writing: {ACTIONS;DENIES/REPORTS:21021675::Denies}  No Rheumatology ROS completed.   PMFS History:  Patient Active Problem List   Diagnosis Date Noted   Well woman exam with routine gynecological exam 08/12/2024   Pelvic pain 08/12/2024   Allergic rhinitis, unspecified 01/22/2024   Chronic right hip pain 01/07/2024   Anogenital HSV infection 08/13/2023   Hot flashes 07/26/2023   Vitamin B12 deficiency 06/20/2023   Flushing 06/03/2023   Chronic urticaria 03/18/2023   Acne vulgaris 03/18/2023   Seasonal and perennial allergic rhinitis 10/24/2022   Allergic conjunctivitis of both eyes 10/24/2022   Nickel allergy  10/24/2022   Raynaud's phenomenon 06/18/2022   Rosacea 03/05/2019   Anxiety 03/05/2019    Past Medical History:  Diagnosis Date   Allergy     Hay fever, pollen, mold etc.   Anal fissure    Anxiety    Cardiac arrhythmia    during chidlhood   Depression    Eczema    Ehlers-Danlos syndrome 2025   dx by ortho, Dr. Burnetta   Nipple pain x's 2 months   Rosacea    Urticaria     Family History  Problem Relation Age of Onset   Urticaria Mother    Eczema Mother     Allergic rhinitis Mother    Diabetes Mother    Colon polyps Mother    Irritable bowel syndrome Mother    Hyperlipidemia Mother    Osteoporosis Mother    Hypertension Mother    Depression Father    Anxiety disorder Father    BRCA 1/2 Neg Hx    Breast cancer Neg Hx    Colitis Neg Hx    Esophageal cancer Neg Hx    Pancreatic cancer Neg Hx    Stomach cancer Neg Hx    Past Surgical History:  Procedure Laterality Date   AUGMENTATION MAMMAPLASTY Bilateral    breast enhancement & removal   BREAST IMPLANT REMOVAL Bilateral    CYST REMOVAL HAND Left 2025   ganglion cyst   REFRACTIVE SURGERY Bilateral    Social History[1] Social History   Social History Narrative   Not on file     Immunization History  Administered Date(s) Administered   INFLUENZA, HIGH DOSE SEASONAL PF 03/31/2022   Influenza, Mdck, Trivalent,PF 6+ MOS(egg free) 05/04/2024   Influenza, Seasonal, Injecte, Preservative Fre 05/11/2023   Influenza,inj,Quad PF,6+ Mos 03/28/2019   Influenza-Unspecified 04/03/2019, 04/20/2020, 04/27/2021   Novavax(Covid-19) Vaccine 08/29/2023   PFIZER(Purple Top)SARS-COV-2 Vaccination 11/12/2019, 12/07/2019, 07/01/2020   Tdap 03/05/2019     Objective: Vital Signs: There were no vitals taken for this visit.   Physical Exam   Musculoskeletal Exam: ***  CDAI Exam: CDAI Score: -- Patient Global: --; Provider Global: -- Swollen: --; Tender: -- Joint Exam  09/24/2024   No joint exam has been documented for this visit   There is currently no information documented on the homunculus. Go to the Rheumatology activity and complete the homunculus joint exam.  Investigation: No additional findings.  Imaging: No results found.  Recent Labs: Lab Results  Component Value Date   WBC 9.6 06/09/2024   HGB 13.9 06/09/2024   PLT 225 06/09/2024   NA 135 06/09/2024   K 4.0 06/09/2024   CL 102 06/09/2024   CO2 25 06/09/2024   GLUCOSE 90 06/09/2024   BUN 26 (H) 06/09/2024    CREATININE 0.86 06/09/2024   BILITOT 0.3 06/09/2024   ALKPHOS 54 10/20/2023   AST 14 06/09/2024   ALT 26 06/09/2024   PROT 6.8 06/09/2024   ALBUMIN 3.9 10/20/2023   CALCIUM 8.6 06/09/2024    Speciality Comments: No specialty comments available.  Procedures:  No procedures performed Allergies: Benzoyl peroxide   Assessment / Plan:     Visit Diagnoses: Undifferentiated connective tissue disease  Other fatigue  Raynaud's phenomenon without gangrene  Hypermobility of joint  Post-COVID-19 condition  Rosacea  Seasonal and perennial allergic rhinitis  Anxiety  Chronic urticaria  High risk medication use  Orders: No orders of the defined types were placed in this encounter.  No orders of the defined types were placed in this encounter.   Face-to-face time spent with patient was *** minutes. Greater than 50% of time was spent in counseling and coordination of care.  Follow-Up Instructions: No follow-ups on file.   Marie CHRISTELLA Craze, PA-C  Note - This record has been created using Dragon software.  Chart creation errors have been sought, but may not always  have been located. Such creation errors do not reflect on  the standard of medical care.     [1]  Social History Tobacco Use   Smoking status: Never    Passive exposure: Never   Smokeless tobacco: Never  Vaping Use   Vaping status: Never Used  Substance Use Topics   Alcohol use: Not Currently   Drug use: Never   "

## 2024-09-11 ENCOUNTER — Ambulatory Visit
Admission: RE | Admit: 2024-09-11 | Discharge: 2024-09-11 | Disposition: A | Source: Ambulatory Visit | Attending: Family Medicine

## 2024-09-11 DIAGNOSIS — Z1231 Encounter for screening mammogram for malignant neoplasm of breast: Secondary | ICD-10-CM

## 2024-09-24 ENCOUNTER — Ambulatory Visit: Admitting: Physician Assistant

## 2024-09-24 DIAGNOSIS — J3089 Other allergic rhinitis: Secondary | ICD-10-CM

## 2024-09-24 DIAGNOSIS — Z79899 Other long term (current) drug therapy: Secondary | ICD-10-CM

## 2024-09-24 DIAGNOSIS — M359 Systemic involvement of connective tissue, unspecified: Secondary | ICD-10-CM

## 2024-09-24 DIAGNOSIS — F419 Anxiety disorder, unspecified: Secondary | ICD-10-CM

## 2024-09-24 DIAGNOSIS — M249 Joint derangement, unspecified: Secondary | ICD-10-CM

## 2024-09-24 DIAGNOSIS — L508 Other urticaria: Secondary | ICD-10-CM

## 2024-09-24 DIAGNOSIS — I7300 Raynaud's syndrome without gangrene: Secondary | ICD-10-CM

## 2024-09-24 DIAGNOSIS — U099 Post covid-19 condition, unspecified: Secondary | ICD-10-CM

## 2024-09-24 DIAGNOSIS — R5383 Other fatigue: Secondary | ICD-10-CM

## 2024-09-24 DIAGNOSIS — L719 Rosacea, unspecified: Secondary | ICD-10-CM

## 2024-10-01 ENCOUNTER — Ambulatory Visit: Admitting: Physical Therapy

## 2025-01-07 ENCOUNTER — Ambulatory Visit: Admitting: Physician Assistant

## 2025-06-23 ENCOUNTER — Encounter: Admitting: Family Medicine

## 2025-08-11 ENCOUNTER — Ambulatory Visit: Admitting: Obstetrics and Gynecology
